# Patient Record
Sex: Male | Born: 1935 | Race: White | Hispanic: No | Marital: Married | Smoking: Former smoker
Health system: Southern US, Community
[De-identification: ages and names within clinical notes are randomized; demographics above are authoritative.]

## PROBLEM LIST (undated history)

## (undated) DIAGNOSIS — N200 Calculus of kidney: Secondary | ICD-10-CM

## (undated) DIAGNOSIS — I2510 Atherosclerotic heart disease of native coronary artery without angina pectoris: Secondary | ICD-10-CM

## (undated) DIAGNOSIS — I10 Essential (primary) hypertension: Secondary | ICD-10-CM

## (undated) DIAGNOSIS — G4730 Sleep apnea, unspecified: Secondary | ICD-10-CM

## (undated) DIAGNOSIS — M2060 Acquired deformities of toe(s), unspecified, unspecified foot: Secondary | ICD-10-CM

## (undated) DIAGNOSIS — N400 Enlarged prostate without lower urinary tract symptoms: Secondary | ICD-10-CM

## (undated) DIAGNOSIS — D509 Iron deficiency anemia, unspecified: Secondary | ICD-10-CM

## (undated) DIAGNOSIS — E785 Hyperlipidemia, unspecified: Secondary | ICD-10-CM

## (undated) DIAGNOSIS — I739 Peripheral vascular disease, unspecified: Secondary | ICD-10-CM

## (undated) DIAGNOSIS — Z87442 Personal history of urinary calculi: Secondary | ICD-10-CM

## (undated) DIAGNOSIS — H409 Unspecified glaucoma: Secondary | ICD-10-CM

## (undated) DIAGNOSIS — F349 Persistent mood [affective] disorder, unspecified: Secondary | ICD-10-CM

## (undated) DIAGNOSIS — S72001A Fracture of unspecified part of neck of right femur, initial encounter for closed fracture: Secondary | ICD-10-CM

## (undated) DIAGNOSIS — K648 Other hemorrhoids: Secondary | ICD-10-CM

## (undated) DIAGNOSIS — E538 Deficiency of other specified B group vitamins: Secondary | ICD-10-CM

## (undated) DIAGNOSIS — F0390 Unspecified dementia without behavioral disturbance: Secondary | ICD-10-CM

## (undated) DIAGNOSIS — Z961 Presence of intraocular lens: Secondary | ICD-10-CM

## (undated) DIAGNOSIS — L853 Xerosis cutis: Secondary | ICD-10-CM

## (undated) DIAGNOSIS — E559 Vitamin D deficiency, unspecified: Secondary | ICD-10-CM

## (undated) DIAGNOSIS — M2040 Other hammer toe(s) (acquired), unspecified foot: Secondary | ICD-10-CM

## (undated) DIAGNOSIS — F0280 Dementia in other diseases classified elsewhere without behavioral disturbance: Secondary | ICD-10-CM

## (undated) DIAGNOSIS — E119 Type 2 diabetes mellitus without complications: Secondary | ICD-10-CM

## (undated) DIAGNOSIS — N201 Calculus of ureter: Secondary | ICD-10-CM

## (undated) DIAGNOSIS — A045 Campylobacter enteritis: Secondary | ICD-10-CM

## (undated) DIAGNOSIS — K760 Fatty (change of) liver, not elsewhere classified: Secondary | ICD-10-CM

## (undated) DIAGNOSIS — N1330 Unspecified hydronephrosis: Secondary | ICD-10-CM

## (undated) DIAGNOSIS — I700 Atherosclerosis of aorta: Secondary | ICD-10-CM

## (undated) HISTORY — DX: Fracture of unspecified part of neck of right femur, initial encounter for closed fracture: S72.001A

## (undated) HISTORY — DX: Essential (primary) hypertension: I10

## (undated) HISTORY — DX: Peripheral vascular disease, unspecified: I73.9

## (undated) HISTORY — DX: Xerosis cutis: L85.3

## (undated) HISTORY — DX: Other hemorrhoids: K64.8

## (undated) HISTORY — DX: Campylobacter enteritis: A04.5

## (undated) HISTORY — DX: Calculus of kidney: N20.0

## (undated) HISTORY — DX: Acquired deformities of toe(s), unspecified, unspecified foot: M20.60

## (undated) HISTORY — DX: Fatty (change of) liver, not elsewhere classified: K76.0

## (undated) HISTORY — DX: Benign prostatic hyperplasia without lower urinary tract symptoms: N40.0

## (undated) HISTORY — DX: Sleep apnea, unspecified: G47.30

## (undated) HISTORY — DX: Atherosclerosis of aorta: I70.0

## (undated) HISTORY — DX: Vitamin D deficiency, unspecified: E55.9

## (undated) HISTORY — DX: Unspecified glaucoma: H40.9

## (undated) HISTORY — DX: Persistent mood (affective) disorder, unspecified: F34.9

## (undated) HISTORY — DX: Unspecified dementia, unspecified severity, without behavioral disturbance, psychotic disturbance, mood disturbance, and anxiety: F03.90

## (undated) HISTORY — DX: Iron deficiency anemia, unspecified: D50.9

## (undated) HISTORY — DX: Unspecified hydronephrosis: N13.30

## (undated) HISTORY — PX: TONSILLECTOMY: SUR1361

## (undated) HISTORY — DX: Atherosclerotic heart disease of native coronary artery without angina pectoris: I25.10

## (undated) HISTORY — PX: VASECTOMY: SHX75

## (undated) HISTORY — PX: KIDNEY SURGERY: SHX687

## (undated) HISTORY — DX: Deficiency of other specified B group vitamins: E53.8

## (undated) HISTORY — DX: Other hammer toe(s) (acquired), unspecified foot: M20.40

## (undated) HISTORY — DX: Calculus of ureter: N20.1

## (undated) HISTORY — DX: Hyperlipidemia, unspecified: E78.5

## (undated) HISTORY — DX: Presence of intraocular lens: Z96.1

## (undated) HISTORY — PX: HIP FRACTURE SURGERY: SHX118

## (undated) HISTORY — PX: TONSILLECTOMY: SHX5217

## (undated) HISTORY — DX: Type 2 diabetes mellitus without complications: E11.9

## (undated) HISTORY — DX: Personal history of urinary calculi: Z87.442

## (undated) HISTORY — DX: Dementia in other diseases classified elsewhere, unspecified severity, without behavioral disturbance, psychotic disturbance, mood disturbance, and anxiety: F02.80

---

## 1997-08-10 ENCOUNTER — Encounter: Admission: RE | Admit: 1997-08-10 | Discharge: 1997-11-08 | Payer: Self-pay | Admitting: Emergency Medicine

## 2000-02-14 ENCOUNTER — Ambulatory Visit (HOSPITAL_COMMUNITY): Admission: RE | Admit: 2000-02-14 | Discharge: 2000-02-15 | Payer: Self-pay | Admitting: Cardiovascular Disease

## 2000-02-14 HISTORY — PX: CORONARY ANGIOPLASTY WITH STENT PLACEMENT: SHX49

## 2004-02-05 ENCOUNTER — Ambulatory Visit: Payer: Self-pay | Admitting: Internal Medicine

## 2004-02-12 ENCOUNTER — Ambulatory Visit: Payer: Self-pay | Admitting: Internal Medicine

## 2004-05-13 ENCOUNTER — Ambulatory Visit: Payer: Self-pay | Admitting: Internal Medicine

## 2004-08-14 ENCOUNTER — Ambulatory Visit: Payer: Self-pay | Admitting: Internal Medicine

## 2004-11-13 ENCOUNTER — Ambulatory Visit: Payer: Self-pay | Admitting: Internal Medicine

## 2004-12-25 ENCOUNTER — Ambulatory Visit: Payer: Self-pay | Admitting: Internal Medicine

## 2005-02-13 ENCOUNTER — Ambulatory Visit: Payer: Self-pay | Admitting: Internal Medicine

## 2005-02-20 ENCOUNTER — Ambulatory Visit: Payer: Self-pay | Admitting: Internal Medicine

## 2005-05-22 ENCOUNTER — Ambulatory Visit: Payer: Self-pay | Admitting: Internal Medicine

## 2005-08-21 ENCOUNTER — Ambulatory Visit: Payer: Self-pay | Admitting: Internal Medicine

## 2005-11-27 ENCOUNTER — Ambulatory Visit: Payer: Self-pay | Admitting: Internal Medicine

## 2005-12-17 ENCOUNTER — Ambulatory Visit: Payer: Self-pay | Admitting: Internal Medicine

## 2005-12-25 DIAGNOSIS — I2510 Atherosclerotic heart disease of native coronary artery without angina pectoris: Secondary | ICD-10-CM

## 2005-12-25 HISTORY — DX: Atherosclerotic heart disease of native coronary artery without angina pectoris: I25.10

## 2006-01-09 ENCOUNTER — Inpatient Hospital Stay (HOSPITAL_BASED_OUTPATIENT_CLINIC_OR_DEPARTMENT_OTHER): Admission: RE | Admit: 2006-01-09 | Discharge: 2006-01-09 | Payer: Self-pay | Admitting: Cardiovascular Disease

## 2006-01-09 HISTORY — PX: CORONARY ANGIOPLASTY WITH STENT PLACEMENT: SHX49

## 2006-01-14 ENCOUNTER — Inpatient Hospital Stay (HOSPITAL_COMMUNITY): Admission: RE | Admit: 2006-01-14 | Discharge: 2006-01-18 | Payer: Self-pay | Admitting: Surgery

## 2006-01-17 HISTORY — PX: CORONARY ARTERY BYPASS GRAFT: SHX141

## 2006-02-26 ENCOUNTER — Encounter (HOSPITAL_COMMUNITY): Admission: RE | Admit: 2006-02-26 | Discharge: 2006-05-27 | Payer: Self-pay | Admitting: Cardiovascular Disease

## 2006-04-03 ENCOUNTER — Ambulatory Visit: Payer: Self-pay | Admitting: Internal Medicine

## 2006-04-03 LAB — CONVERTED CEMR LAB
ALT: 11 units/L (ref 0–40)
AST: 16 units/L (ref 0–37)
Albumin: 4.1 g/dL (ref 3.5–5.2)
Alkaline Phosphatase: 53 units/L (ref 39–117)
BUN: 12 mg/dL (ref 6–23)
Basophils Absolute: 0 10*3/uL (ref 0.0–0.1)
Basophils Relative: 0.5 % (ref 0.0–1.0)
Bilirubin, Direct: 0.1 mg/dL (ref 0.0–0.3)
CO2: 27 meq/L (ref 19–32)
Calcium: 10.1 mg/dL (ref 8.4–10.5)
Chloride: 106 meq/L (ref 96–112)
Cholesterol: 167 mg/dL (ref 0–200)
Creatinine, Ser: 0.9 mg/dL (ref 0.4–1.5)
Eosinophils Absolute: 0.5 10*3/uL (ref 0.0–0.6)
Eosinophils Relative: 6.4 % — ABNORMAL HIGH (ref 0.0–5.0)
GFR calc Af Amer: 107 mL/min
GFR calc non Af Amer: 88 mL/min
Glucose, Bld: 113 mg/dL — ABNORMAL HIGH (ref 70–99)
HCT: 38.3 % — ABNORMAL LOW (ref 39.0–52.0)
HDL: 37.8 mg/dL — ABNORMAL LOW (ref >39.0)
Hemoglobin: 13.5 g/dL (ref 13.0–17.0)
Hgb A1c MFr Bld: 7.2 % — ABNORMAL HIGH (ref 4.6–6.0)
LDL Cholesterol: 98 mg/dL (ref 0–99)
Lymphocytes Relative: 27.1 % (ref 12.0–46.0)
MCHC: 35.2 g/dL (ref 30.0–36.0)
MCV: 80.2 fL (ref 78.0–100.0)
Monocytes Absolute: 0.8 10*3/uL — ABNORMAL HIGH (ref 0.2–0.7)
Monocytes Relative: 10.5 % (ref 3.0–11.0)
Neutro Abs: 4.3 10*3/uL (ref 1.4–7.7)
Neutrophils Relative %: 55.5 % (ref 43.0–77.0)
PSA: 1.27 ng/mL (ref 0.10–4.00)
Platelets: 430 10*3/uL — ABNORMAL HIGH (ref 150–400)
Potassium: 5.2 meq/L — ABNORMAL HIGH (ref 3.5–5.1)
RBC: 4.77 M/uL (ref 4.22–5.81)
RDW: 12.7 % (ref 11.5–14.6)
Sodium: 144 meq/L (ref 135–145)
TSH: 3.72 microintl units/mL (ref 0.35–5.50)
Total Bilirubin: 1.1 mg/dL (ref 0.3–1.2)
Total CHOL/HDL Ratio: 4.4
Total Protein: 6.6 g/dL (ref 6.0–8.3)
Triglycerides: 154 mg/dL — ABNORMAL HIGH (ref 0–149)
VLDL: 31 mg/dL (ref 0–40)
WBC: 7.7 10*3/uL (ref 4.5–10.5)

## 2006-04-09 ENCOUNTER — Ambulatory Visit: Payer: Self-pay | Admitting: Internal Medicine

## 2006-07-15 ENCOUNTER — Ambulatory Visit: Payer: Self-pay | Admitting: Internal Medicine

## 2006-07-15 LAB — CONVERTED CEMR LAB: Hgb A1c MFr Bld: 8 % — ABNORMAL HIGH (ref 4.6–6.0)

## 2006-07-17 ENCOUNTER — Encounter: Payer: Self-pay | Admitting: Internal Medicine

## 2006-07-17 DIAGNOSIS — I2510 Atherosclerotic heart disease of native coronary artery without angina pectoris: Secondary | ICD-10-CM | POA: Insufficient documentation

## 2006-07-17 DIAGNOSIS — E785 Hyperlipidemia, unspecified: Secondary | ICD-10-CM | POA: Insufficient documentation

## 2006-07-17 DIAGNOSIS — E119 Type 2 diabetes mellitus without complications: Secondary | ICD-10-CM | POA: Insufficient documentation

## 2006-10-15 ENCOUNTER — Ambulatory Visit: Payer: Self-pay | Admitting: Internal Medicine

## 2006-10-15 LAB — CONVERTED CEMR LAB: Hgb A1c MFr Bld: 7.6 % — ABNORMAL HIGH

## 2006-12-08 ENCOUNTER — Ambulatory Visit: Payer: Self-pay | Admitting: Internal Medicine

## 2007-01-12 ENCOUNTER — Ambulatory Visit: Payer: Self-pay | Admitting: Internal Medicine

## 2007-01-22 ENCOUNTER — Ambulatory Visit: Payer: Self-pay | Admitting: Gastroenterology

## 2007-02-08 ENCOUNTER — Ambulatory Visit: Payer: Self-pay | Admitting: Gastroenterology

## 2007-02-25 LAB — HM COLONOSCOPY

## 2007-04-14 ENCOUNTER — Ambulatory Visit: Payer: Self-pay | Admitting: Internal Medicine

## 2007-04-20 ENCOUNTER — Encounter: Payer: Self-pay | Admitting: Internal Medicine

## 2007-07-14 ENCOUNTER — Ambulatory Visit: Payer: Self-pay | Admitting: Internal Medicine

## 2007-07-14 LAB — CONVERTED CEMR LAB
ALT: 14 units/L (ref 0–53)
Basophils Absolute: 0.1 10*3/uL (ref 0.0–0.1)
Bilirubin, Direct: 0.1 mg/dL (ref 0.0–0.3)
CO2: 29 meq/L (ref 19–32)
Calcium: 9.2 mg/dL (ref 8.4–10.5)
Cholesterol: 152 mg/dL (ref 0–200)
Eosinophils Absolute: 0.7 10*3/uL (ref 0.0–0.7)
GFR calc Af Amer: 122 mL/min
GFR calc non Af Amer: 101 mL/min
Hemoglobin: 14.4 g/dL (ref 13.0–17.0)
LDL Cholesterol: 94 mg/dL (ref 0–99)
Lymphocytes Relative: 27.1 % (ref 12.0–46.0)
MCHC: 34.4 g/dL (ref 30.0–36.0)
Neutro Abs: 4.1 10*3/uL (ref 1.4–7.7)
PSA: 1.43 ng/mL (ref 0.10–4.00)
RDW: 12.4 % (ref 11.5–14.6)
Sodium: 139 meq/L (ref 135–145)
TSH: 2.8 microintl units/mL (ref 0.35–5.50)
Total Bilirubin: 0.7 mg/dL (ref 0.3–1.2)
Triglycerides: 127 mg/dL (ref 0–149)
VLDL: 25 mg/dL (ref 0–40)

## 2007-09-15 ENCOUNTER — Telehealth: Payer: Self-pay | Admitting: Internal Medicine

## 2007-11-15 ENCOUNTER — Ambulatory Visit: Payer: Self-pay | Admitting: Internal Medicine

## 2007-11-15 LAB — CONVERTED CEMR LAB: Hgb A1c MFr Bld: 6.7 % — ABNORMAL HIGH (ref 4.6–6.0)

## 2007-11-19 ENCOUNTER — Ambulatory Visit: Payer: Self-pay | Admitting: Internal Medicine

## 2008-01-10 ENCOUNTER — Encounter: Payer: Self-pay | Admitting: Internal Medicine

## 2008-02-02 ENCOUNTER — Telehealth: Payer: Self-pay | Admitting: Internal Medicine

## 2008-03-13 ENCOUNTER — Ambulatory Visit: Payer: Self-pay | Admitting: Internal Medicine

## 2008-06-26 ENCOUNTER — Encounter: Payer: Self-pay | Admitting: Internal Medicine

## 2008-07-10 ENCOUNTER — Ambulatory Visit: Payer: Self-pay | Admitting: Internal Medicine

## 2008-07-10 LAB — CONVERTED CEMR LAB
Microalb, Ur: 7.5 mg/dL — ABNORMAL HIGH (ref 0.0–1.9)
PSA: 1.66 ng/mL (ref 0.10–4.00)

## 2008-10-31 ENCOUNTER — Ambulatory Visit: Payer: Self-pay | Admitting: Internal Medicine

## 2008-11-02 LAB — CONVERTED CEMR LAB
ALT: 14 units/L (ref 0–53)
AST: 20 units/L (ref 0–37)
Basophils Relative: 0.7 % (ref 0.0–3.0)
Bilirubin, Direct: 0.1 mg/dL (ref 0.0–0.3)
Chloride: 103 meq/L (ref 96–112)
Eosinophils Relative: 9.2 % — ABNORMAL HIGH (ref 0.0–5.0)
GFR calc non Af Amer: 87.59 mL/min (ref >60)
HCT: 44.1 % (ref 39.0–52.0)
Hgb A1c MFr Bld: 7 % — ABNORMAL HIGH (ref 4.6–6.5)
LDL Cholesterol: 93 mg/dL (ref 0–99)
Lymphs Abs: 2.2 10*3/uL (ref 0.7–4.0)
MCV: 94 fL (ref 78.0–100.0)
Monocytes Absolute: 0.9 10*3/uL (ref 0.1–1.0)
Monocytes Relative: 9.8 % (ref 3.0–12.0)
Neutrophils Relative %: 56.6 % (ref 43.0–77.0)
Potassium: 4.8 meq/L (ref 3.5–5.1)
RBC: 4.69 M/uL (ref 4.22–5.81)
Total CHOL/HDL Ratio: 5
Total Protein: 6.6 g/dL (ref 6.0–8.3)
VLDL: 29.6 mg/dL (ref 0.0–40.0)
WBC: 9.1 10*3/uL (ref 4.5–10.5)

## 2008-11-29 ENCOUNTER — Ambulatory Visit: Payer: Self-pay | Admitting: Internal Medicine

## 2009-01-02 ENCOUNTER — Encounter (INDEPENDENT_AMBULATORY_CARE_PROVIDER_SITE_OTHER): Payer: Self-pay | Admitting: *Deleted

## 2009-02-13 ENCOUNTER — Ambulatory Visit: Payer: Self-pay | Admitting: Internal Medicine

## 2009-02-13 DIAGNOSIS — L259 Unspecified contact dermatitis, unspecified cause: Secondary | ICD-10-CM | POA: Insufficient documentation

## 2009-02-20 ENCOUNTER — Telehealth: Payer: Self-pay | Admitting: Internal Medicine

## 2009-02-27 ENCOUNTER — Ambulatory Visit: Payer: Self-pay | Admitting: Internal Medicine

## 2009-03-13 ENCOUNTER — Ambulatory Visit: Payer: Self-pay | Admitting: Internal Medicine

## 2009-06-12 ENCOUNTER — Ambulatory Visit: Payer: Self-pay | Admitting: Internal Medicine

## 2009-06-12 LAB — CONVERTED CEMR LAB: Hgb A1c MFr Bld: 6.6 % — ABNORMAL HIGH (ref 4.6–6.5)

## 2009-07-10 ENCOUNTER — Encounter: Payer: Self-pay | Admitting: Internal Medicine

## 2009-09-13 ENCOUNTER — Ambulatory Visit: Payer: Self-pay | Admitting: Internal Medicine

## 2009-09-13 LAB — CONVERTED CEMR LAB: Hgb A1c MFr Bld: 7.6 % — ABNORMAL HIGH (ref 4.6–6.5)

## 2009-12-10 ENCOUNTER — Ambulatory Visit: Payer: Self-pay | Admitting: Internal Medicine

## 2009-12-11 ENCOUNTER — Telehealth: Payer: Self-pay | Admitting: Internal Medicine

## 2010-01-15 ENCOUNTER — Encounter: Payer: Self-pay | Admitting: Internal Medicine

## 2010-01-15 ENCOUNTER — Ambulatory Visit: Payer: Self-pay | Admitting: Cardiovascular Disease

## 2010-01-16 ENCOUNTER — Encounter: Payer: Self-pay | Admitting: Internal Medicine

## 2010-01-16 ENCOUNTER — Ambulatory Visit: Payer: Self-pay | Admitting: Cardiovascular Disease

## 2010-03-12 ENCOUNTER — Other Ambulatory Visit: Payer: Self-pay | Admitting: Internal Medicine

## 2010-03-12 ENCOUNTER — Ambulatory Visit
Admission: RE | Admit: 2010-03-12 | Discharge: 2010-03-12 | Payer: Self-pay | Source: Home / Self Care | Attending: Internal Medicine | Admitting: Internal Medicine

## 2010-03-12 LAB — HEMOGLOBIN A1C: Hgb A1c MFr Bld: 7.4 % — ABNORMAL HIGH (ref 4.6–6.5)

## 2010-03-28 NOTE — Assessment & Plan Note (Signed)
Summary: 3 month rov/njr   Vital Signs:  Patient profile:   78 year old male Weight:      170 pounds Temp:     98.4 degrees F oral BP sitting:   190 / 90  (left arm) Cuff size:   regular  Vitals Entered By: Duard Brady LPN (June 12, 2009 10:49 AM) CC: 3 mon rov-doing okay Is Patient Diabetic? Yes   CC:  3 mon rov-doing okay.  History of Present Illness: 77 year old patient seen today for follow-up of his hypertension, type 2 diabetes, coronary artery disease.  Multiple medicines have been held due to a suspected  drug allergic allergy with a dermatitis.  This has resolved, and it was felt that lisinopril, probably was the culprit.  He does try blood pressures at home with fairly decent readings, but was quite high on arrival here.  His last hemoglobin A1c7.2.  He is on low-dose metformin, as well as glyburide.  His lovastatin niacin, have been held, nifedipine.  Also has been held  Preventive Screening-Counseling & Management  Alcohol-Tobacco     Smoking Status: quit  Allergies (verified): 1)  ! Lisinopril (Lisinopril)  Past History:  Past Medical History: Reviewed history from 07/14/2007 and no changes required. Coronary artery disease; status post PTCA in 2001; status post CABG 2007 Diabetes mellitus, type II; 1998 Hyperlipidemia Hypertension; 1998  Review of Systems  The patient denies anorexia, fever, weight loss, weight gain, vision loss, decreased hearing, hoarseness, chest pain, syncope, dyspnea on exertion, peripheral edema, prolonged cough, headaches, hemoptysis, abdominal pain, melena, hematochezia, severe indigestion/heartburn, hematuria, incontinence, genital sores, muscle weakness, suspicious skin lesions, transient blindness, difficulty walking, depression, unusual weight change, abnormal bleeding, enlarged lymph nodes, angioedema, breast masses, and testicular masses.    Physical Exam  General:  overweight-appearing.  160/80overweight-appearing.     Head:  Normocephalic and atraumatic without obvious abnormalities. No apparent alopecia or balding. Eyes:  No corneal or conjunctival inflammation noted. EOMI. Perrla. Funduscopic exam benign, without hemorrhages, exudates or papilledema. Vision grossly normal. Ears:  External ear exam shows no significant lesions or deformities.  Otoscopic examination reveals clear canals, tympanic membranes are intact bilaterally without bulging, retraction, inflammation or discharge. Hearing is grossly normal bilaterally. Mouth:  Oral mucosa and oropharynx without lesions or exudates.  Teeth in good repair. Neck:  No deformities, masses, or tenderness noted. Lungs:  Normal respiratory effort, chest expands symmetrically. Lungs are clear to auscultation, no crackles or wheezes. Heart:  Normal rate and regular rhythm. S1 and S2 normal without gallop, murmur, click, rub or other extra sounds. Abdomen:  Bowel sounds positive,abdomen soft and non-tender without masses, organomegaly or hernias noted. Msk:  No deformity or scoliosis noted of thoracic or lumbar spine.   Pulses:  the left dorsalis pedis pulse full ;other pedal pulses not easily palpable Extremities:  No clubbing, cyanosis, edema, or deformity noted with normal full range of motion of all joints.     Impression & Recommendations:  Problem # 1:  DERMATITIS (ICD-692.9) resolved  Problem # 2:  HYPERTENSION (ICD-401.9)  The following medications were removed from the medication list:    Lisinopril 20 Mg Tabs (Lisinopril) .[name]... Take 1 tablet by mouth daily His updated medication list for this problem includes:    Nifedipine 30 Mg Tb24 (Nifedipine) .[name]... 1 once daily    Carvedilol 12.5 Mg Tabs (Carvedilol) .[name]... 1 two times a day  The following medications were removed from the medication list:    Lisinopril 20 Mg Tabs (  Lisinopril) .[name]... Take 1 tablet by mouth daily His updated medication list for this problem includes:    Nifedipine 30 Mg Tb24  (Nifedipine) .[name]... 1 once daily    Carvedilol 12.5 Mg Tabs (Carvedilol) .[name]... 1 two times a day  Problem # 3:  HYPERLIPIDEMIA (ICD-272.4)  The following medications were removed from the medication list:    Lovastatin 20 Mg Tabs (Lovastatin) .[name]... 2 once daily His updated medication list for this problem includes:    Niacin Cr 1000 Mg Cr-tabs (Niacin) .[name]... 1 once daily    Lovastatin 20 Mg Tabs (Lovastatin) ..... One daily  The following medications were removed from the medication list:    Lovastatin 20 Mg Tabs (Lovastatin) .[name]... 2 once daily His updated medication list for this problem includes:    Niacin Cr 1000 Mg Cr-tabs (Niacin) .[name]... 1 once daily    Lovastatin 20 Mg Tabs (Lovastatin) ..... One daily  Problem # 4:  DIABETES MELLITUS, TYPE II (ICD-250.00)  The following medications were removed from the medication list:    Lisinopril 20 Mg Tabs (Lisinopril) .[name]... Take 1 tablet by mouth daily    Buffered Aspirin 325 Mg Tabs (Aspirin buf(cacarb-mgcarb-mgo)) .[name]... 1 once daily His updated medication list for this problem includes:    Glyburide 5 Mg Tabs (Glyburide) .[name]... 1/2 bid    Metformin Hcl 1000 Mg Tabs (Metformin hcl) .[name]... 1 once daily    Aspir-low 81 Mg Tbec (Aspirin) ..... Once daily    The following medications were removed from the medication list:    Lisinopril 20 Mg Tabs (Lisinopril) .[name]... Take 1 tablet by mouth daily    Buffered Aspirin 325 Mg Tabs (Aspirin buf(cacarb-mgcarb-mgo)) .[name]... 1 once daily His updated medication list for this problem includes:    Glyburide 5 Mg Tabs (Glyburide) .[name]... 1/2 bid    Metformin Hcl 1000 Mg Tabs (Metformin hcl) .[name]... 1 once daily    Aspir-low 81 Mg Tbec (Aspirin) ..... Once daily  Orders: Venipuncture (96045) TLB-A1C / Hgb A1C (Glycohemoglobin) (83036-A1C)  Complete Medication List: 1)  Glyburide 5 Mg Tabs (Glyburide) .... 1/2 bid 2)  Metformin Hcl 1000 Mg Tabs (Metformin hcl) .[name].. 1 once daily 3)  Nifedipine 30 Mg Tb24  (Nifedipine) .[name].. 1 once daily 4)  Carvedilol 12.5 Mg Tabs (Carvedilol) .[name].. 1 two times a day 5)  Travatan 0.004 % Soln (Travoprost) .... Uad 6)  Niacin Cr 1000 Mg Cr-tabs (Niacin) .[name].. 1 once daily 7)  Accu-chek Softclix Lancets Misc (Lancets) .... Use daily 8)  Accu-chek Aviva Strp (Glucose blood) .... Once daily 9)  Aspir-low 81 Mg Tbec (Aspirin) .... Once daily 10)  Flax Seeds Powd (Flaxseed (linseed)) .... Once daily 11)  Multivitamins Caps (Multiple vitamin) .... Once daily 12)  Vision Formula Tabs (Multiple vitamins-minerals) .... Once daily 13)  Lovastatin 20 Mg Tabs (Lovastatin) .... One daily 14)  Accu-chek Aviva Strp (Glucose blood) .... Use daily  Patient Instructions: 1)  Please schedule a follow-up appointment in 3 months. 2)  Limit your Sodium (Salt). 3)  It is important that you exercise regularly at least 20 minutes 5 times a week. If you develop chest pain, have severe difficulty breathing, or feel very tired , stop exercising immediately and seek medical attention. 4)  You need to lose weight. Consider a lower calorie diet and regular exercise.  5)  Check your blood sugars regularly. If your readings are usually above : or below 70 you should contact our office. 6)  It is important that your  Diabetic A1c level is checked every 3 months. 7)  See your eye doctor yearly to check for diabetic eye damage. Prescriptions: ACCU-CHEK AVIVA  STRP (GLUCOSE BLOOD) use daily  #90 x 6   Entered and Authorized by:   Gordy Savers  MD   Signed by:   Gordy Savers  MD on 06/12/2009   Method used:   Print then Give to Patient   RxID:   [id number] LOVASTATIN 20 MG TABS (LOVASTATIN) one daily  #90 x 6   Entered and Authorized by:   Gordy Savers  MD   Signed by:   Gordy Savers  MD on 06/12/2009   Method used:   Print then Give to Patient   RxID:   [id number] ACCU-CHEK SOFTCLIX LANCETS  MISC (LANCETS) use daily  #90 x 6   Entered and Authorized by:    Gordy Savers  MD   Signed by:   Gordy Savers  MD on 06/12/2009   Method used:   Print then Give to Patient   RxID:   [id number] NIACIN CR 1000 MG CR-TABS (NIACIN) 1 once daily  #90 x 6   Entered and Authorized by:   Gordy Savers  MD   Signed by:   Gordy Savers  MD on 06/12/2009   Method used:   Print then Give to Patient   RxID:   [id number] CARVEDILOL 12.5 MG  TABS (CARVEDILOL) 1 two times a day  #180 x 6   Entered and Authorized by:   Gordy Savers  MD   Signed by:   Gordy Savers  MD on 06/12/2009   Method used:   Print then Give to Patient   RxID:   [id number] NIFEDIPINE 30 MG  TB24 (NIFEDIPINE) 1 once daily  #90 Each x 4   Entered and Authorized by:   Gordy Savers  MD   Signed by:   Gordy Savers  MD on 06/12/2009   Method used:   Print then Give to Patient   RxID:   [id number] METFORMIN HCL 1000 MG TABS (METFORMIN HCL) 1 once daily  #90 x 6   Entered and Authorized by:   Gordy Savers  MD   Signed by:   Gordy Savers  MD on 06/12/2009   Method used:   Print then Give to Patient   RxID:   [id number] GLYBURIDE 5 MG  TABS (GLYBURIDE) 1/2 bid  #90 x 6   Entered and Authorized by:   Gordy Savers  MD   Signed by:   Gordy Savers  MD on 06/12/2009   Method used:   Print then Give to Patient   RxID:   [id number] ACCU-CHEK AVIVA  STRP (GLUCOSE BLOOD) once daily  #100 x 0   Entered by:   Duard Brady LPN   Authorized by:   Gordy Savers  MD   Signed by:   Gordy Savers  MD on 06/12/2009   Method used:   Print then Give to Patient   RxID:   [id number]

## 2010-03-28 NOTE — Assessment & Plan Note (Signed)
Summary: Follow up/ns policy/cb  a  Vital Signs:  Patient profile:   77 year old male Weight:      172 pounds Temp:     98.2 degrees F oral BP sitting:   146 / 70  (right arm) Cuff size:   regular  Vitals Entered By: Duard Brady LPN (September 13, 2009 9:55 AM) CC: rov- doing well     fbs 89 Is Patient Diabetic? Yes Did you bring your meter with you today? No   CC:  rov- doing well     fbs 89.  History of Present Illness: 77 year old patient who has a history of coronary artery disease, type 2 diabetes, and dyslipidemia.  He has had a dermatitis related to ACE inhibition, and this has been discontinued.  He has  also discontinued amlodipine, but has maintained nice, blood pressure control.  he has altered his  oral diabetic regimen.  Has seen cardiology approximately 2 months ago.  No cardiac symptoms.  Preventive Screening-Counseling & Management  Alcohol-Tobacco     Smoking Status: quit  Allergies: 1)  ! Lisinopril (Lisinopril)  Past History:  Past Medical History: Reviewed history from 07/14/2007 and no changes required. Coronary artery disease; status post PTCA in 2001; status post CABG 2007 Diabetes mellitus, type II; 1998 Hyperlipidemia Hypertension; 1998  Past Surgical History: Reviewed history from 04/14/2007 and no changes required. Coronary artery bypass graft PTCA/stent Vasectomy Tonsillectomy Cardiolite  stress test in November 2007 colonoscopy January 2009  Review of Systems  The patient denies anorexia, fever, weight loss, weight gain, vision loss, decreased hearing, hoarseness, chest pain, syncope, dyspnea on exertion, peripheral edema, prolonged cough, headaches, hemoptysis, abdominal pain, melena, hematochezia, severe indigestion/heartburn, hematuria, incontinence, genital sores, muscle weakness, suspicious skin lesions, transient blindness, difficulty walking, depression, unusual weight change, abnormal bleeding, enlarged lymph nodes, angioedema,  breast masses, and testicular masses.    Physical Exam  General:  overweight-appearing.  130/70overweight-appearing.   Head:  Normocephalic and atraumatic without obvious abnormalities. No apparent alopecia or balding. Eyes:  No corneal or conjunctival inflammation noted. EOMI. Perrla. Funduscopic exam benign, without hemorrhages, exudates or papilledema. Vision grossly normal. Mouth:  Oral mucosa and oropharynx without lesions or exudates.  Teeth in good repair. Neck:  No deformities, masses, or tenderness noted. faint right supra-clavicular ruing Chest Wall:  No deformities, masses, tenderness or gynecomastia noted. Lungs:  few crackles, right base Heart:  Normal rate and regular rhythm. S1 and S2 normal without gallop, murmur, click, rub or other extra sounds. Abdomen:  Bowel sounds positive,abdomen soft and non-tender without masses, organomegaly or hernias noted. Msk:  No deformity or scoliosis noted of thoracic or lumbar spine.   Extremities:  No clubbing, cyanosis, edema, or deformity noted with normal full range of motion of all joints.    Diabetes Management Exam:    Eye Exam:       Eye Exam done here today          Results: normal   Impression & Recommendations:  Problem # 1:  HYPERTENSION (ICD-401.9)  The following medications were removed from the medication list:    Nifedipine 30 Mg Tb24 (Nifedipine) .[name]... 1 once daily His updated medication list for this problem includes:    Carvedilol 12.5 Mg Tabs (Carvedilol) .[name]... 1 two times a day  The following medications were removed from the medication list:    Nifedipine 30 Mg Tb24 (Nifedipine) .[name]... 1 once daily His updated medication list for this problem includes:    Carvedilol 12.5 Mg  Tabs (Carvedilol) .[name]... 1 two times a day  Problem # 2:  DIABETES MELLITUS, TYPE II (ICD-250.00)  The following medications were removed from the medication list:    Aspir-low 81 Mg Tbec (Aspirin) ..... Once daily His updated  medication list for this problem includes:    Glyburide 5 Mg Tabs (Glyburide) .[name]... 1/2 bid    Metformin Hcl 1000 Mg Tabs (Metformin hcl) .[name]... 1 twice daily    Aspir-low 81 Mg Tbec (Aspirin) ..... One daily  Orders: Venipuncture (88416) TLB-A1C / Hgb A1C (Glycohemoglobin) (83036-A1C) Prescription Created Electronically [phone])  The following medications were removed from the medication list:    Aspir-low 81 Mg Tbec (Aspirin) ..... Once daily His updated medication list for this problem includes:    Glyburide 5 Mg Tabs (Glyburide) .[name]... 1/2 bid    Metformin Hcl 1000 Mg Tabs (Metformin hcl) .[name]... 1 once daily  Problem # 3:  HYPERLIPIDEMIA (ICD-272.4)  The following medications were removed from the medication list:    Niacin Cr 1000 Mg Cr-tabs (Niacin) .[name]... 1 once daily His updated medication list for this problem includes:    Lovastatin 20 Mg Tabs (Lovastatin) ..... One daily  The following medications were removed from the medication list:    Niacin Cr 1000 Mg Cr-tabs (Niacin) .[name]... 1 once daily His updated medication list for this problem includes:    Lovastatin 20 Mg Tabs (Lovastatin) ..... One daily  Complete Medication List: 1)  Glyburide 5 Mg Tabs (Glyburide) .... 1/2 bid 2)  Metformin Hcl 1000 Mg Tabs (Metformin hcl) .[name].. 1 twice daily 3)  Carvedilol 12.5 Mg Tabs (Carvedilol) .[name].. 1 two times a day 4)  Travatan 0.004 % Soln (Travoprost) .... Uad 5)  Accu-chek Softclix Lancets Misc (Lancets) .... Use daily 6)  Accu-chek Aviva Strp (Glucose blood) .... Once daily 7)  Flax Seeds Powd (Flaxseed (linseed)) .... Once daily 8)  Multivitamins Caps (Multiple vitamin) .... Once daily 9)  Vision Formula Tabs (Multiple vitamins-minerals) .... Once daily 10)  Lovastatin 20 Mg Tabs (Lovastatin) .... One daily 11)  Aspir-low 81 Mg Tbec (Aspirin) .... One daily  Patient Instructions: 1)  Please schedule a follow-up appointment in 3 months. 2)  Limit your Sodium (Salt) to less than  2 grams a day(slightly less than 1/2 a teaspoon) to prevent fluid retention, swelling, or worsening of symptoms. 3)  It is important that you exercise regularly at least 20 minutes 5 times a week. If you develop chest pain, have severe difficulty breathing, or feel very tired , stop exercising immediately and seek medical attention. 4)  You need to lose weight. Consider a lower calorie diet and regular exercise.  5)  Check your blood sugars regularly. If your readings are usually above : or below 70 you should contact our office. 6)  It is important that your Diabetic A1c level is checked every 3 months. 7)  See your eye doctor yearly to check for diabetic eye damage. Prescriptions: ACCU-CHEK AVIVA  STRP (GLUCOSE BLOOD) use daily  #90 x 6   Entered and Authorized by:   Gordy Savers  MD   Signed by:   Gordy Savers  MD on 09/13/2009   Method used:   Electronically to        [facility]  [phone]* (retail)       [address]       [place], [place]  [address]  Ph: [phone] or [phone]       Fax: [phone]   RxID:   [id number] LOVASTATIN 20 MG TABS (LOVASTATIN) one daily  #90 x 6   Entered and Authorized by:   Gordy Savers  MD   Signed by:   Gordy Savers  MD on 09/13/2009   Method used:   Electronically to        [facility]  [phone]* (retail)       [address]       [place], [place]  [address]       Ph: [phone] or [phone]       Fax: [phone]   RxID:   [id number] ACCU-CHEK SOFTCLIX LANCETS  MISC (LANCETS) use daily  #90 x 6   Entered and Authorized by:   Gordy Savers  MD   Signed by:   Gordy Savers  MD on 09/13/2009   Method used:   Electronically to        [facility]  [phone]* (retail)       [address]       [place], [place]  [address]       Ph: [phone] or [phone]       Fax:  [phone]   RxID:   [id number] CARVEDILOL 12.5 MG  TABS (CARVEDILOL) 1 two times a day  #180 x 6   Entered and Authorized by:   Gordy Savers  MD   Signed by:   Gordy Savers  MD on 09/13/2009   Method used:   Electronically to        [facility]  [phone]* (retail)       [address]       [place], [place]  [address]       Ph: [phone] or [phone]       Fax: [phone]   RxID:   [id number] METFORMIN HCL 1000 MG TABS (METFORMIN HCL) 1 once daily  #90 x 6   Entered and Authorized by:   Gordy Savers  MD   Signed by:   Gordy Savers  MD on 09/13/2009   Method used:   Electronically to        [facility]  [phone]* (retail)       [address]       [place], [place]  [address]       Ph: [phone] or [phone]       Fax: [phone]   RxID:   [id number] GLYBURIDE 5 MG  TABS (GLYBURIDE) 1/2 bid  #90 x 6   Entered and Authorized by:   Gordy Savers  MD   Signed by:   Gordy Savers  MD on 09/13/2009   Method used:   Electronically to        [facility]  [phone]* (retail)       [address]       [place], [place]  [address]       Ph: [phone] or [phone]       Fax: [phone]   RxID:   [id number]

## 2010-03-28 NOTE — Progress Notes (Signed)
Summary: REQUEST FOR RESULTS  Phone Note Call from Patient   Caller: Patient Summary of Call: Pt adv that he didn't receive results from his hgba1c.... Request a return call to [phone] to advise same once available.  ADDENDUM-----PT REQ TO BE TRANSFERRED TO SPEAK WITH A NURSE - DIDN'T WANT TO WAIT FOR CALL ADVISING RESULTS.   Initial call taken by: Debbra Riding,  December 11, 2009 10:14 AM  Follow-up for Phone Call        callee pt - nitro sent , do not have labs results yet - will call once avilb. KIK Follow-up by: Duard Brady LPN,  December 11, 2009 10:24 AM  Additional Follow-up for Phone Call Additional follow up Details #1::        hemoglobin A1c was elevated at 7.5.  Notified patient that this is elevated.  Stressed diet, weight loss and exercise.  Notified patient that may require additional medication in 3 months if not improved Additional Follow-up by: Gordy Savers  MD,  December 12, 2009 8:47 AM    Additional Follow-up for Phone Call Additional follow up Details #2::    called pt - informed of lab - discussed diet ,exercise and wt loss. possible more medication in 3 mos if not improved.   mailed copy of labs per request to pt home address   KIK Follow-up by: Duard Brady LPN,  December 12, 2009 9:59 AM

## 2010-03-28 NOTE — Assessment & Plan Note (Signed)
Summary: 3 MONTH ROV/NJR   Vital Signs:  Patient profile:   77 year old male Weight:      174 pounds Temp:     98.4 degrees F oral BP sitting:   122 / 80  (right arm) Cuff size:   regular  Vitals Entered By: Duard Brady LPN (March 12, 2010 10:28 AM) CC: 3 mos rov - doing well    fbs 127 Is Patient Diabetic? Yes Did you bring your meter with you today? No   CC:  3 mos rov - doing well    fbs 127.  History of Present Illness: 47 -year-old patient who is seen today for follow-up of his type 2 diabetes.  He is single and A1c's have attended since the spring of last year.  His last hemoglobin A1c7.5.  He is on submaximal dose of both glyburide and metformin. He has coronary artery disease and is followed closely by cardiology.  He remains on Pravachol for dyslipidemia.  His cardiac status has been stable.  He has hypertension, treated with carvedilol.  Allergies: 1)  ! Lisinopril (Lisinopril)  Past History:  Past Medical History: Reviewed history from 07/14/2007 and no changes required. Coronary artery disease; status post PTCA in 2001; status post CABG 2007 Diabetes mellitus, type II; 1998 Hyperlipidemia Hypertension; 1998  Past Surgical History: Coronary artery bypass graft PTCA/stent Vasectomy Tonsillectomy Cardiolite  stress test in November 2007, November 2009 colonoscopy January 2009  Family History: Reviewed history from 07/14/2007 and no changes required. father died of an MI 22 mother died of a stroke 10 no siblings  paternal grandmother with history of  diabetes answerable vascular disease  Social History: Reviewed history from 10/31/2008 and no changes required. Married Regular exercise-yes  Review of Systems  The patient denies anorexia, fever, weight loss, weight gain, vision loss, decreased hearing, hoarseness, chest pain, syncope, dyspnea on exertion, peripheral edema, prolonged cough, headaches, hemoptysis, abdominal pain, melena,  hematochezia, severe indigestion/heartburn, hematuria, incontinence, genital sores, muscle weakness, suspicious skin lesions, transient blindness, difficulty walking, depression, unusual weight change, abnormal bleeding, enlarged lymph nodes, angioedema, breast masses, and testicular masses.    Physical Exam  General:  Well-developed,well-nourished,in no acute distress; alert,appropriate and cooperative throughout examination; overweight.  Blood pressure 122/80 Head:  Normocephalic and atraumatic without obvious abnormalities. No apparent alopecia or balding. Eyes:  No corneal or conjunctival inflammation noted. EOMI. Perrla. Funduscopic exam benign, without hemorrhages, exudates or papilledema. Vision grossly normal. Mouth:  Oral mucosa and oropharynx without lesions or exudates.  Teeth in good repair. Neck:  No deformities, masses, or tenderness noted. Lungs:  Normal respiratory effort, chest expands symmetrically. Lungs are clear to auscultation, no crackles or wheezes. Heart:  Normal rate and regular rhythm. S1 and S2 normal without gallop, murmur, click, rub or other extra sounds. Abdomen:  Bowel sounds positive,abdomen soft and non-tender without masses, organomegaly or hernias noted. Msk:  No deformity or scoliosis noted of thoracic or lumbar spine.   Pulses:  R and L carotid,radial,femoral,dorsalis pedis and posterior tibial pulses are full and equal bilaterally Extremities:  No clubbing, cyanosis, edema, or deformity noted with normal full range of motion of all joints.     Impression & Recommendations:  Problem # 1:  HYPERTENSION (ICD-401.9)  His updated medication list for this problem includes:    Carvedilol 12.5 Mg Tabs (Carvedilol) .[name]... 1 two times a day  His updated medication list for this problem includes:    Carvedilol 12.5 Mg Tabs (Carvedilol) .[name]... 1 two times a  day  Problem # 2:  DIABETES MELLITUS, TYPE II (ICD-250.00)  His updated medication list for this problem  includes:    Glyburide 5 Mg Tabs (Glyburide) .[name]... 1/2 bid    Metformin Hcl 1000 Mg Tabs (Metformin hcl) .[name]... 1 by mouth once daily twice daily    Aspir-low 81 Mg Tbec (Aspirin) ..... One daily    His updated medication list for this problem includes:    Glyburide 5 Mg Tabs (Glyburide) .[name]... 1/2 bid    Metformin Hcl 1000 Mg Tabs (Metformin hcl) .[name]... 1 by mouth once daily twice daily    Aspir-low 81 Mg Tbec (Aspirin) ..... One daily  Orders: Venipuncture (16109) TLB-A1C / Hgb A1C (Glycohemoglobin) (83036-A1C) Specimen Handling (60454)  Problem # 3:  HYPERLIPIDEMIA (ICD-272.4)  The following medications were removed from the medication list:    Pravachol 20 Mg Tabs (Pravastatin sodium) ..... One daily His updated medication list for this problem includes:    Niacin 250 Mg Tabs (Niacin) ..... Qd  The following medications were removed from the medication list:    Pravachol 20 Mg Tabs (Pravastatin sodium) ..... One daily His updated medication list for this problem includes:    Niacin 250 Mg Tabs (Niacin) ..... Qd  Complete Medication List: 1)  Glyburide 5 Mg Tabs (Glyburide) .... 1/2 bid 2)  Metformin Hcl 1000 Mg Tabs (Metformin hcl) .[name].. 1 by mouth once daily twice daily 3)  Carvedilol 12.5 Mg Tabs (Carvedilol) .[name].. 1 two times a day 4)  Travatan 0.004 % Soln (Travoprost) .... Uad 5)  Accu-chek Softclix Lancets Misc (Lancets) .... Use daily 6)  Accu-chek Aviva Strp (Glucose blood) .... Once daily 7)  Flax Seeds Powd (Flaxseed (linseed)) .... Once daily 8)  Multivitamins Caps (Multiple vitamin) .... Once daily 9)  Vision Formula Tabs (Multiple vitamins-minerals) .... Once daily 10)  Aspir-low 81 Mg Tbec (Aspirin) .... One daily 11)  Niacin 250 Mg Tabs (Niacin) .... Qd 12)  Nitrostat 0.4 Mg Subl (Nitroglycerin) .... One sublingually as needed for chest pain  Patient Instructions: 1)  Please schedule a follow-up appointment in 3 months. 2)  It is important that you exercise  regularly at least 20 minutes 5 times a week. If you develop chest pain, have severe difficulty breathing, or feel very tired , stop exercising immediately and seek medical attention.  Max HR 120  3)  You need to lose weight. Consider a lower calorie diet and regular exercise.  4)  Advised not to eat any food or drink any liquids after 10 PM the night before your procedure. 5)  Check your blood sugars regularly. If your readings are usually above : or below 70 you should contact our office. 6)  It is important that your Diabetic A1c level is checked every 3 months for CPX  7)  See your eye doctor yearly to check for diabetic eye damage. Prescriptions: METFORMIN HCL 1000 MG TABS (METFORMIN HCL) 1 by mouth once daily twice daily  #180 x 6   Entered and Authorized by:   Gordy Savers  MD   Signed by:   Gordy Savers  MD on 03/12/2010   Method used:   Electronically to        [facility]  [phone]* (retail)       [address]       [place], [place]  [address]       Ph: [phone] or [phone]  Fax: [phone]   RxID:   [id number]    Orders Added: 1)  Venipuncture [84696] 2)  TLB-A1C / Hgb A1C (Glycohemoglobin) [83036-A1C] 3)  Est. Patient Level IV [29528] 4)  Specimen Handling [99000]

## 2010-03-28 NOTE — Assessment & Plan Note (Signed)
Summary: continued pruritis/dm   Vital Signs:  Patient profile:   77 year old male Weight:      168 pounds Temp:     98.0 degrees F oral BP sitting:   112 / 50  (left arm) Cuff size:   regular  Vitals Entered By: Raechel Ache, RN (February 27, 2009 10:59 AM) CC: Rash no better- prednisone didn't help. Is Patient Diabetic? Yes   CC:  Rash no better- prednisone didn't help.[name]  History of Present Illness: 77 year old patient seen today complaining of persistent fairly generalized pruritic dermatitis.  He feels that it is related to a new formulation of nifedipine that he has obtained from the [facility] hospital system.  He has seen dermatology in the past; blood  pressure today is low normal.  He was treated with a prednisone dose pack without benefit.  He states that topical  Medications can aggravate the dermatitis  Allergies: No Known Drug Allergies  Physical Exam  General:  Well-developed,well-nourished,in no acute distress; alert,appropriate and cooperative throughout examination; the pressure 100/70 Skin:  generalized and maculopapular dermatitis.  It spares the face and distal extremities   Impression & Recommendations:  Problem # 1:  DERMATITIS (ICD-692.9)  The following medications were removed from the medication list:    Prednisone (pak) 10 Mg Tabs (Prednisone) .[name]... As directed  Problem # 2:  HYPERTENSION (ICD-401.9)  His updated medication list for this problem includes:    Lisinopril 20 Mg Tabs (Lisinopril) .[name]... Take 1 tablet by mouth two times a day    Nifedipine 30 Mg Tb24 (Nifedipine) .[name]... 1 once daily    Carvedilol 12.5 Mg Tabs (Carvedilol) .[name]... 1 two times a day will put the nifedipine on hold  Complete Medication List: 1)  Glyburide 5 Mg Tabs (Glyburide) .[name].. 1  twice daily 2)  Lisinopril 20 Mg Tabs (Lisinopril) .... Take 1 tablet by mouth two times a day 3)  Metformin Hcl 1000 Mg Tabs (Metformin hcl) .... Take 1 tablet by mouth twice a day 4)  Nifedipine  30 Mg Tb24 (Nifedipine) .[name].. 1 once daily 5)  Carvedilol 12.5 Mg Tabs (Carvedilol) .[name].. 1 two times a day 6)  Lovastatin 20 Mg Tabs (Lovastatin) .... 2 once daily 7)  Travatan 0.004 % Soln (Travoprost) .... Uad 8)  Buffered Aspirin 325 Mg Tabs (Aspirin buf(cacarb-mgcarb-mgo)) .[name].. 1 once daily 9)  Niacin Cr 1000 Mg Cr-tabs (Niacin) .[name].. 1 once daily 10)  Accu-chek Softclix Lancets Misc (Lancets) .... Use daily 11)  Accu-chek Comfort Curve Strp (Glucose blood) .... Use daily 12)  Hydroxyzine Hcl 25 Mg Tabs (Hydroxyzine hcl) .... One or two every 6 hours as needed for itching  Patient Instructions: 1)  Limit your Sodium (Salt). 2)  Please schedule a follow-up appointment as needed. 3)  dermatology follow-up if needed Prescriptions: HYDROXYZINE HCL 25 MG TABS (HYDROXYZINE HCL) one or two every 6 hours as needed for itching  #50 x 2   Entered and Authorized by:   Gordy Savers  MD   Signed by:   Gordy Savers  MD on 02/27/2009   Method used:   Print then Give to Patient   RxID:   [id number]

## 2010-03-28 NOTE — Letter (Signed)
Summary: [facility]   Imported By: Maryln Gottron 01/24/2010 14:15:24  _____________________________________________________________________  External Attachment:    Type:   Image     Comment:   External Document

## 2010-03-28 NOTE — Progress Notes (Signed)
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    Initial call taken by: Debbra Riding,  December 11, 2009 10:12 AM    Prescriptions: NITROSTAT 0.4 MG SUBL (NITROGLYCERIN) one sublingually as needed for chest pain  #6 x 1   Entered by:   Duard Brady LPN   Authorized by:   Gordy Savers  MD   Signed by:   Duard Brady LPN on 09/81/1914   Method used:   Electronically to        [facility]  [phone]* (retail)       [address]       [place], [place]  [address]       Ph: [phone] or [phone]       Fax: [phone]   RxID:   [id number]

## 2010-03-28 NOTE — Assessment & Plan Note (Signed)
Summary: 4 wk rov/mm   Vital Signs:  Patient profile:   77 year old male Weight:      168 pounds Temp:     98.4 degrees F oral BP sitting:   126 / 66  (left arm) Cuff size:   regular  Vitals Entered By: Raechel Ache, RN (March 13, 2009 11:04 AM) CC: F/u rash- saw dermatologist, on Tetracycline and light tx's.  BS 80 Is Patient Diabetic? Yes   CC:  F/u rash- saw dermatologist and on Tetracycline and light tx's.  BS 80.  History of Present Illness: 77 year old patient who is seen today for follow-up of his hypertension and type 2 diabetes.  He is followed closely by dermatology for a diffuse pruritic, erythematous, maculopapular rash.  His blood sugar and blood pressures have remained stable in spite of dose reductions.  He feels well.  He does have a history of coronary artery disease, which has been stable.  Allergies: No Known Drug Allergies  Review of Systems       The patient complains of anorexia, weight loss, and suspicious skin lesions.  The patient denies fever, weight gain, vision loss, decreased hearing, hoarseness, chest pain, syncope, dyspnea on exertion, peripheral edema, prolonged cough, headaches, hemoptysis, abdominal pain, melena, hematochezia, severe indigestion/heartburn, hematuria, incontinence, genital sores, muscle weakness, transient blindness, difficulty walking, depression, unusual weight change, abnormal bleeding, enlarged lymph nodes, angioedema, breast masses, and testicular masses.    Physical Exam  General:  overweight-appearing.  no distress.  Blood pressure low normal Head:  Normocephalic and atraumatic without obvious abnormalities. No apparent alopecia or balding. Mouth:  Oral mucosa and oropharynx without lesions or exudates.  Teeth in good repair. Neck:  No deformities, masses, or tenderness noted. Lungs:  Normal respiratory effort, chest expands symmetrically. Lungs are clear to auscultation, no crackles or wheezes. Heart:  Normal rate and  regular rhythm. S1 and S2 normal without gallop, murmur, click, rub or other extra sounds. Abdomen:  Bowel sounds positive,abdomen soft and non-tender without masses, organomegaly or hernias noted. Skin:  generalized erythematous maculopapular rash, most marked centrally.  Recent biopsy site noted.  Right lateral chest wall region Cervical Nodes:  No lymphadenopathy noted   Impression & Recommendations:  Problem # 1:  HYPERTENSION (ICD-401.9)  His updated medication list for this problem includes:    Lisinopril 20 Mg Tabs (Lisinopril) .[name]... Take 1 tablet by mouth daily    Nifedipine 30 Mg Tb24 (Nifedipine) .[name]... 1 once daily    Carvedilol 12.5 Mg Tabs (Carvedilol) .[name]... 1 two times a day  His updated medication list for this problem includes:    Lisinopril 20 Mg Tabs (Lisinopril) .[name]... Take 1 tablet by mouth daily    Nifedipine 30 Mg Tb24 (Nifedipine) .[name]... 1 once daily    Carvedilol 12.5 Mg Tabs (Carvedilol) .[name]... 1 two times a day  Problem # 2:  DIABETES MELLITUS, TYPE II (ICD-250.00)  His updated medication list for this problem includes:    Glyburide 5 Mg Tabs (Glyburide) .[name]... 1  every a.m., only    Lisinopril 20 Mg Tabs (Lisinopril) .[name]... Take 1 tablet by mouth daily    Metformin Hcl 1000 Mg Tabs (Metformin hcl) .[name]... Take 1 tablet by mouth daily    Buffered Aspirin 325 Mg Tabs (Aspirin buf(cacarb-mgcarb-mgo)) .[name]... 1 once daily    His updated medication list for this problem includes:    Glyburide 5 Mg Tabs (Glyburide) .[name]... 1  every a.m., only    Lisinopril 20 Mg Tabs (Lisinopril) .Marland KitchenMarland KitchenMarland KitchenMarland Kitchen  Take 1 tablet by mouth daily    Metformin Hcl 1000 Mg Tabs (Metformin hcl) .[name]... Take 1 tablet by mouth daily    Buffered Aspirin 325 Mg Tabs (Aspirin buf(cacarb-mgcarb-mgo)) .[name]... 1 once daily  Orders: Venipuncture (81191) TLB-A1C / Hgb A1C (Glycohemoglobin) (83036-A1C)  Complete Medication List: 1)  Glyburide 5 Mg Tabs (Glyburide) .[name].. 1  every a.m., only 2)  Lisinopril 20 Mg Tabs  (Lisinopril) .... Take 1 tablet by mouth daily 3)  Metformin Hcl 1000 Mg Tabs (Metformin hcl) .... Take 1 tablet by mouth daily 4)  Nifedipine 30 Mg Tb24 (Nifedipine) .[name].. 1 once daily 5)  Carvedilol 12.5 Mg Tabs (Carvedilol) .[name].. 1 two times a day 6)  Lovastatin 20 Mg Tabs (Lovastatin) .... 2 once daily 7)  Travatan 0.004 % Soln (Travoprost) .... Uad 8)  Buffered Aspirin 325 Mg Tabs (Aspirin buf(cacarb-mgcarb-mgo)) .[name].. 1 once daily 9)  Niacin Cr 1000 Mg Cr-tabs (Niacin) .[name].. 1 once daily 10)  Accu-chek Softclix Lancets Misc (Lancets) .... Use daily 11)  Accu-chek Comfort Curve Strp (Glucose blood) .... Use daily 12)  Hydroxyzine Hcl 25 Mg Tabs (Hydroxyzine hcl) .... One or two every 6 hours as needed for itching 13)  Zolpidem Tartrate 10 Mg Tabs (Zolpidem tartrate) .... One at bedtime as needed for sleep  Patient Instructions: 1)  Please schedule a follow-up appointment in 3 months. 2)  Limit your Sodium (Salt). 3)  It is important that you exercise regularly at least 20 minutes 5 times a week. If you develop chest pain, have severe difficulty breathing, or feel very tired , stop exercising immediately and seek medical attention. 4)  You need to lose weight. Consider a lower calorie diet and regular exercise.  5)  Check your blood sugars regularly. If your readings are usually above : or below 70 you should contact our office. 6)  It is important that your Diabetic A1c level is checked every 3 months. 7)  See your eye doctor yearly to check for diabetic eye damage. Prescriptions: ZOLPIDEM TARTRATE 10 MG TABS (ZOLPIDEM TARTRATE) one at bedtime as needed for sleep  #30 x 4   Entered and Authorized by:   Gordy Savers  MD   Signed by:   Gordy Savers  MD on 03/13/2009   Method used:   Print then Give to Patient   RxID:   [id number] LOVASTATIN 20 MG  TABS (LOVASTATIN) 2 once daily  #180 x 6   Entered and Authorized by:   Gordy Savers  MD   Signed by:   Gordy Savers  MD on 03/13/2009   Method used:   Print then Give to Patient   RxID:   [id number] CARVEDILOL 12.5 MG  TABS (CARVEDILOL) 1 two times a day  #180 x 6   Entered and Authorized by:   Gordy Savers  MD   Signed by:   Gordy Savers  MD on 03/13/2009   Method used:   Print then Give to Patient   RxID:   [id number] METFORMIN HCL 1000 MG TABS (METFORMIN HCL) Take 1 tablet by mouth daily  #90 x 4   Entered and Authorized by:   Gordy Savers  MD   Signed by:   Gordy Savers  MD on 03/13/2009   Method used:   Print then Give to Patient   RxID:   [id number] LISINOPRIL 20 MG TABS (LISINOPRIL) Take 1 tablet by mouth daily  #90 x 4   Entered  and Authorized by:   Gordy Savers  MD   Signed by:   Gordy Savers  MD on 03/13/2009   Method used:   Print then Give to Patient   RxID:   [id number] GLYBURIDE 5 MG  TABS (GLYBURIDE) 1  every a.m., only  #90 x 4   Entered and Authorized by:   Gordy Savers  MD   Signed by:   Gordy Savers  MD on 03/13/2009   Method used:   Print then Give to Patient   RxID:   [id number]

## 2010-03-28 NOTE — Letter (Signed)
Summary: [facility]   Imported By: Maryln Gottron 07/18/2009 10:58:04  _____________________________________________________________________  External Attachment:    Type:   Image     Comment:   External Document

## 2010-03-28 NOTE — Assessment & Plan Note (Signed)
Summary: 3 MONTH ROV/NJR/PT RSC/CJR   Vital Signs:  Patient profile:   76 year old male Weight:      170 pounds Temp:     98.5 degrees F oral BP sitting:   140 / 90  (right arm) Cuff size:   regular  Vitals Entered By: Duard Brady LPN (December 10, 2009 3:30 PM) CC: 3 mos rov - doing ok     fbs 106 Is Patient Diabetic? Yes Did you bring your meter with you today? Yes   CC:  3 mos rov - doing ok     fbs 106.  History of Present Illness: 77 year old patient who is seen today for follow-up of his type 2 diabetes.  He has hypertension and dyslipidemia.  He states he is no longer taking statin therapy due to myalgias.  Is also decreased his metformin to 1000 mg twice daily to once daily only.  His last hemoglobin A1c was elevated.  He denies any cardiac symptoms.  Problems Prior to Update: 1)  Dermatitis  (ICD-692.9) 2)  Special Screening Malignant Neoplasm of Prostate  (ICD-V76.44) 3)  Health Screening  (ICD-V70.0) 4)  Hypertension  (ICD-401.9) 5)  Hyperlipidemia  (ICD-272.4) 6)  Diabetes Mellitus, Type II  (ICD-250.00) 7)  Coronary Artery Disease  (ICD-414.00)  Medications Prior to Update: 1)  Glyburide 5 Mg  Tabs (Glyburide) .... 1/2 Bid 2)  Metformin Hcl 1000 Mg Tabs (Metformin Hcl) .[name].. 1 Twice Daily 3)  Carvedilol 12.5 Mg  Tabs (Carvedilol) .[name].. 1 Two Times A Day 4)  Travatan 0.004 %  Soln (Travoprost) .... Uad 5)  Accu-Chek Softclix Lancets  Misc (Lancets) .... Use Daily 6)  Accu-Chek Aviva  Strp (Glucose Blood) .... Once Daily 7)  Flax Seeds  Powd (Flaxseed (Linseed)) .... Once Daily 8)  Multivitamins  Caps (Multiple Vitamin) .... Once Daily 9)  Vision Formula  Tabs (Multiple Vitamins-Minerals) .... Once Daily 10)  Lovastatin 20 Mg Tabs (Lovastatin) .... One Daily 11)  Aspir-Low 81 Mg Tbec (Aspirin) .... One Daily  Allergies: 1)  ! Lisinopril (Lisinopril)  Past History:  Past Medical History: Reviewed history from 07/14/2007 and no changes  required. Coronary artery disease; status post PTCA in 2001; status post CABG 2007 Diabetes mellitus, type II; 1998 Hyperlipidemia Hypertension; 1998  Past Surgical History: Reviewed history from 04/14/2007 and no changes required. Coronary artery bypass graft PTCA/stent Vasectomy Tonsillectomy Cardiolite  stress test in November 2007 colonoscopy January 2009  Review of Systems  The patient denies anorexia, fever, weight loss, weight gain, vision loss, decreased hearing, hoarseness, chest pain, syncope, dyspnea on exertion, peripheral edema, prolonged cough, headaches, hemoptysis, abdominal pain, melena, hematochezia, severe indigestion/heartburn, hematuria, incontinence, genital sores, muscle weakness, suspicious skin lesions, transient blindness, difficulty walking, depression, unusual weight change, abnormal bleeding, enlarged lymph nodes, angioedema, breast masses, and testicular masses.    Physical Exam  General:  overweight-appearing.  blood pressure, normaloverweight-appearing.   Head:  Normocephalic and atraumatic without obvious abnormalities. No apparent alopecia or balding. Eyes:  cataract, left Mouth:  Oral mucosa and oropharynx without lesions or exudates.  Teeth in good repair. Neck:  No deformities, masses, or tenderness noted. Chest Wall:  sternotomy scar Lungs:  Normal respiratory effort, chest expands symmetrically. Lungs are clear to auscultation, no crackles or wheezes. Heart:  Normal rate and regular rhythm. S1 and S2 normal without gallop, murmur, click, rub or other extra sounds. Abdomen:  Bowel sounds positive,abdomen soft and non-tender without masses, organomegaly or hernias noted. Msk:  No deformity or  scoliosis noted of thoracic or lumbar spine.   Pulses:  R and L carotid,radial,femoral,dorsalis pedis and posterior tibial pulses are full and equal bilaterally Extremities:  No clubbing, cyanosis, edema, or deformity noted with normal full range of motion of  all joints.     Impression & Recommendations:  Problem # 1:  HYPERTENSION (ICD-401.9)  His updated medication list for this problem includes:    Carvedilol 12.5 Mg Tabs (Carvedilol) .[name]... 1 two times a day  His updated medication list for this problem includes:    Carvedilol 12.5 Mg Tabs (Carvedilol) .[name]... 1 two times a day  Problem # 2:  HYPERLIPIDEMIA (ICD-272.4)  The following medications were removed from the medication list:    Lovastatin 20 Mg Tabs (Lovastatin) ..... One daily His updated medication list for this problem includes:    Niacin 250 Mg Tabs (Niacin) ..... Qd    Pravachol 20 Mg Tabs (Pravastatin sodium) ..... One daily  The following medications were removed from the medication list:    Lovastatin 20 Mg Tabs (Lovastatin) ..... One daily His updated medication list for this problem includes:    Niacin 250 Mg Tabs (Niacin) ..... Qd    Pravachol 20 Mg Tabs (Pravastatin sodium) ..... One daily  Problem # 3:  DIABETES MELLITUS, TYPE II (ICD-250.00)  His updated medication list for this problem includes:    Glyburide 5 Mg Tabs (Glyburide) .[name]... 1/2 bid    Metformin Hcl 1000 Mg Tabs (Metformin hcl) .[name]... 1 by mouth qd    Aspir-low 81 Mg Tbec (Aspirin) ..... One daily  Orders: Venipuncture (59563) TLB-A1C / Hgb A1C (Glycohemoglobin) (83036-A1C)  His updated medication list for this problem includes:    Glyburide 5 Mg Tabs (Glyburide) .[name]... 1/2 bid    Metformin Hcl 1000 Mg Tabs (Metformin hcl) .[name]... 1 by mouth qd    Aspir-low 81 Mg Tbec (Aspirin) ..... One daily  Problem # 4:  CORONARY ARTERY DISEASE (ICD-414.00)  His updated medication list for this problem includes:    Carvedilol 12.5 Mg Tabs (Carvedilol) .[name]... 1 two times a day    Aspir-low 81 Mg Tbec (Aspirin) ..... One daily    Nitrostat 0.4 Mg Subl (Nitroglycerin) .[name]... Place 1 under tongue sl as needed chest pain    Nitrostat 0.4 Mg Subl (Nitroglycerin) ..... One sublingually as needed for chest  pain  His updated medication list for this problem includes:    Carvedilol 12.5 Mg Tabs (Carvedilol) .[name]... 1 two times a day    Aspir-low 81 Mg Tbec (Aspirin) ..... One daily    Nitrostat 0.4 Mg Subl (Nitroglycerin) .[name]... Place 1 under tongue sl as needed chest pain    Nitrostat 0.4 Mg Subl (Nitroglycerin) ..... One sublingually as needed for chest pain  Complete Medication List: 1)  Glyburide 5 Mg Tabs (Glyburide) .... 1/2 bid 2)  Metformin Hcl 1000 Mg Tabs (Metformin hcl) .[name].. 1 by mouth qd 3)  Carvedilol 12.5 Mg Tabs (Carvedilol) .[name].. 1 two times a day 4)  Travatan 0.004 % Soln (Travoprost) .... Uad 5)  Accu-chek Softclix Lancets Misc (Lancets) .... Use daily 6)  Accu-chek Aviva Strp (Glucose blood) .... Once daily 7)  Flax Seeds Powd (Flaxseed (linseed)) .... Once daily 8)  Multivitamins Caps (Multiple vitamin) .... Once daily 9)  Vision Formula Tabs (Multiple vitamins-minerals) .... Once daily 10)  Aspir-low 81 Mg Tbec (Aspirin) .... One daily 11)  Niacin 250 Mg Tabs (Niacin) .... Qd 12)  Nitrostat 0.4 Mg Subl (Nitroglycerin) .... Place 1 under  tongue sl as needed chest pain 13)  Pravachol 20 Mg Tabs (Pravastatin sodium) .... One daily 14)  Nitrostat 0.4 Mg Subl (Nitroglycerin) .... One sublingually as needed for chest pain  Other Orders: Specimen Handling (98119)  Patient Instructions: 1)  Please schedule a follow-up appointment in 3 months. 2)  Limit your Sodium (Salt). 3)  It is important that you exercise regularly at least 20 minutes 5 times a week. If you develop chest pain, have severe difficulty breathing, or feel very tired , stop exercising immediately and seek medical attention. 4)  You need to lose weight. Consider a lower calorie diet and regular exercise.  5)  Check your blood sugars regularly. If your readings are usually above : or below 70 you should contact our office. 6)  It is important that your Diabetic A1c level is checked every 3 months. 7)  See your eye  doctor yearly to check for diabetic eye damage. Prescriptions: PRAVACHOL 20 MG TABS (PRAVASTATIN SODIUM) one daily  #90 x 6   Entered and Authorized by:   Gordy Savers  MD   Signed by:   Gordy Savers  MD on 12/10/2009   Method used:   Electronically to        [facility]  [phone]* (retail)       [address]       [place], [place]  [address]       Ph: [phone] or [phone]       Fax: [phone]   RxID:   [id number] ACCU-CHEK AVIVA  STRP (GLUCOSE BLOOD) once daily  #100 x 6   Entered and Authorized by:   Gordy Savers  MD   Signed by:   Gordy Savers  MD on 12/10/2009   Method used:   Electronically to        [facility]  [phone]* (retail)       [address]       [place], [place]  [address]       Ph: [phone] or [phone]       Fax: [phone]   RxID:   [id number] ACCU-CHEK SOFTCLIX LANCETS  MISC (LANCETS) use daily  #90 x 6   Entered and Authorized by:   Gordy Savers  MD   Signed by:   Gordy Savers  MD on 12/10/2009   Method used:   Electronically to        [facility]  [phone]* (retail)       [address]       [place], [place]  [address]       Ph: [phone] or [phone]       Fax: [phone]   RxID:   [id number] CARVEDILOL 12.5 MG  TABS (CARVEDILOL) 1 two times a day  #180 x 6   Entered and Authorized by:   Gordy Savers  MD   Signed by:   Gordy Savers  MD on 12/10/2009   Method used:   Electronically to        [facility]  [phone]* (retail)       [address]       [place], [place]  [address]       Ph: [phone] or [phone]  Fax: [phone]   RxID:   [id number] METFORMIN HCL 1000 MG TABS (METFORMIN HCL) 1 by mouth qd  #90 x 0   Entered and Authorized by:   Gordy Savers  MD   Signed by:   Gordy Savers   MD on 12/10/2009   Method used:   Electronically to        [facility]  [phone]* (retail)       [address]       [place], [place]  [address]       Ph: [phone] or [phone]       Fax: [phone]   RxID:   [id number] GLYBURIDE 5 MG  TABS (GLYBURIDE) 1/2 bid  #90 x 6   Entered and Authorized by:   Gordy Savers  MD   Signed by:   Gordy Savers  MD on 12/10/2009   Method used:   Electronically to        [facility]  [phone]* (retail)       [address]       [place], [place]  [address]       Ph: [phone] or [phone]       Fax: [phone]   RxID:   [id number]    Orders Added: 1)  Est. Patient Level IV [82505] 2)  Venipuncture [39767] 3)  TLB-A1C / Hgb A1C (Glycohemoglobin) [83036-A1C] 4)  Specimen Handling [99000] 5)  Venipuncture [34193]

## 2010-06-13 ENCOUNTER — Encounter: Payer: Self-pay | Admitting: Internal Medicine

## 2010-06-18 ENCOUNTER — Ambulatory Visit (INDEPENDENT_AMBULATORY_CARE_PROVIDER_SITE_OTHER): Payer: Medicare Other | Admitting: Internal Medicine

## 2010-06-18 ENCOUNTER — Encounter: Payer: Self-pay | Admitting: Internal Medicine

## 2010-06-18 DIAGNOSIS — E119 Type 2 diabetes mellitus without complications: Secondary | ICD-10-CM

## 2010-06-18 DIAGNOSIS — E785 Hyperlipidemia, unspecified: Secondary | ICD-10-CM

## 2010-06-18 DIAGNOSIS — I2510 Atherosclerotic heart disease of native coronary artery without angina pectoris: Secondary | ICD-10-CM

## 2010-06-18 DIAGNOSIS — Z0000 Encounter for general adult medical examination without abnormal findings: Secondary | ICD-10-CM

## 2010-06-18 DIAGNOSIS — I10 Essential (primary) hypertension: Secondary | ICD-10-CM

## 2010-06-18 LAB — BASIC METABOLIC PANEL
BUN: 13 mg/dL (ref 6–23)
CO2: 28 mEq/L (ref 19–32)
Chloride: 101 mEq/L (ref 96–112)
Glucose, Bld: 97 mg/dL (ref 70–99)
Potassium: 4.3 mEq/L (ref 3.5–5.1)
Sodium: 136 mEq/L (ref 135–145)

## 2010-06-18 LAB — HEPATIC FUNCTION PANEL
AST: 18 U/L (ref 0–37)
Albumin: 4.1 g/dL (ref 3.5–5.2)
Total Bilirubin: 0.9 mg/dL (ref 0.3–1.2)

## 2010-06-18 LAB — TSH: TSH: 3.64 u[IU]/mL (ref 0.35–5.50)

## 2010-06-18 LAB — CBC WITH DIFFERENTIAL/PLATELET
Basophils Absolute: 0.1 10*3/uL (ref 0.0–0.1)
Eosinophils Absolute: 0.2 10*3/uL (ref 0.0–0.7)
HCT: 44.9 % (ref 39.0–52.0)
Hemoglobin: 15.5 g/dL (ref 13.0–17.0)
Lymphs Abs: 1.9 10*3/uL (ref 0.7–4.0)
MCHC: 34.5 g/dL (ref 30.0–36.0)
MCV: 93.1 fl (ref 78.0–100.0)
Monocytes Absolute: 0.9 10*3/uL (ref 0.1–1.0)
Monocytes Relative: 10.1 % (ref 3.0–12.0)
Neutro Abs: 5.5 10*3/uL (ref 1.4–7.7)
Platelets: 335 10*3/uL (ref 150.0–400.0)
RDW: 13 % (ref 11.5–14.6)

## 2010-06-18 LAB — LIPID PANEL
Cholesterol: 242 mg/dL — ABNORMAL HIGH (ref 0–200)
HDL: 36.9 mg/dL — ABNORMAL LOW (ref >39.00)
Total CHOL/HDL Ratio: 7
Triglycerides: 181 mg/dL — ABNORMAL HIGH (ref 0.0–149.0)

## 2010-06-18 NOTE — Progress Notes (Signed)
Subjective:    Patient ID: Johnny End., male    DOB: [birth year], 77 y.o.   MRN: [id number]  HPI  is 77 year old patient who is seen today for an annual exam. He is followed by cardiology biannually for coronary artery disease. This has been stable. He has type 2 diabetes on dual therapy. No concerns or complaints today. He has dyslipidemia controlled on niacin.  1. Risk factors, based on past  M,S,F history-  patient has known coronary artery disease coronary vascular risk factors include diabetes dyslipidemia and hypertension  2.  Physical activities: Remains quite active physically he goes to a health club 3 times weekly 3.  Depression/mood: No history of depression or mood disorder  4.  Hearing: Mild hearing deficit  5.  ADL's: Independent in all aspects of daily living 6.  Fall risk: Low  7.  Home safety: No problems identified  8.  Height weight, and visual acuity; height and weight stable no change in visual acuity has a cataract extraction surgery on the left. Is followed by ophthalmology at least annually  9.  Counseling: Heart healthy diet regular exercise modest weight loss all encouraged low sodium diet encouraged 10. Lab orders based on risk factors:  11. Referral: Followup cardiology and ophthalmology as planned  12. Care plan: Heart healthy diet modest weight loss all encouraged cardiology followup encouraged  13. Cognitive assessment: Alert and oriented with normal affect. No cognitive dysfunction.       Review of Systems  Constitutional: Negative for fever, chills, activity change, appetite change and fatigue.  HENT: Negative for hearing loss, ear pain, congestion, rhinorrhea, sneezing, mouth sores, trouble swallowing, neck pain, neck stiffness, dental problem, voice change, sinus pressure and tinnitus.   Eyes: Negative for photophobia, pain, redness and visual disturbance.  Respiratory: Negative for apnea, cough, choking, chest tightness, shortness of  breath and wheezing.   Cardiovascular: Negative for chest pain, palpitations and leg swelling.  Gastrointestinal: Negative for nausea, vomiting, abdominal pain, diarrhea, constipation, blood in stool, abdominal distention, anal bleeding and rectal pain.  Genitourinary: Negative for dysuria, urgency, frequency, hematuria, flank pain, decreased urine volume, discharge, penile swelling, scrotal swelling, difficulty urinating, genital sores and testicular pain.  Musculoskeletal: Negative for myalgias, back pain, joint swelling, arthralgias and gait problem.  Skin: Negative for color change, rash and wound.  Neurological: Negative for dizziness, tremors, seizures, syncope, facial asymmetry, speech difficulty, weakness, light-headedness, numbness and headaches.  Hematological: Negative for adenopathy. Does not bruise/bleed easily.  Psychiatric/Behavioral: Negative for suicidal ideas, hallucinations, behavioral problems, confusion, sleep disturbance, self-injury, dysphoric mood, decreased concentration and agitation. The patient is not nervous/anxious.        Objective:   Physical Exam  Constitutional: He appears well-developed and well-nourished.  HENT:  Head: Normocephalic and atraumatic.  Right Ear: External ear normal.  Left Ear: External ear normal.  Nose: Nose normal.  Mouth/Throat: Oropharynx is clear and moist.  Eyes: Conjunctivae and EOM are normal. Pupils are equal, round, and reactive to light. No scleral icterus.  Neck: Normal range of motion. Neck supple. No JVD present. No thyromegaly present.  Cardiovascular: Normal rate, regular rhythm and normal heart sounds.  Exam reveals no gallop and no friction rub.   No murmur heard.      Sternotomy scar Faint pedal pulses  Pulmonary/Chest: Effort normal and breath sounds normal. He exhibits no tenderness.  Abdominal: Soft. Bowel sounds are normal. He exhibits no distension and no mass. There is no tenderness.  Genitourinary: Prostate  normal  and penis normal.  Musculoskeletal: Normal range of motion. He exhibits no edema and no tenderness.  Lymphadenopathy:    He has no cervical adenopathy.  Neurological: He is alert. He has normal reflexes. No cranial nerve deficit. Coordination normal.  Skin: Skin is warm and dry. No rash noted.       Onychomycotic nails  Psychiatric: He has a normal mood and affect. His behavior is normal.          Assessment & Plan:   Unremarkable annual exam Coronary artery disease. Clinically stable. Type 2 diabetes. We'll check a hemoglobin A1c Dyslipidemia. We'll check a lipid profile  Lifestyle issues addressed. We'll recheck in 3 months

## 2010-06-18 NOTE — Patient Instructions (Signed)
You need to lose weight.  Consider a lower calorie diet and regular exercise.  Limit your sodium (Salt) intake  Please check your blood pressure on a regular basis.  If it is consistently greater than 150/90, please make an office appointment.   Please check your hemoglobin A1c every 3 months

## 2010-07-04 ENCOUNTER — Encounter: Payer: Self-pay | Admitting: *Deleted

## 2010-07-05 ENCOUNTER — Encounter: Payer: Self-pay | Admitting: Cardiovascular Disease

## 2010-07-05 ENCOUNTER — Ambulatory Visit (INDEPENDENT_AMBULATORY_CARE_PROVIDER_SITE_OTHER): Payer: Medicare Other | Admitting: Cardiovascular Disease

## 2010-07-05 VITALS — BP 124/70 | HR 70 | Wt 168.0 lb

## 2010-07-05 DIAGNOSIS — I2510 Atherosclerotic heart disease of native coronary artery without angina pectoris: Secondary | ICD-10-CM

## 2010-07-05 NOTE — Progress Notes (Signed)
Johnny [name]. Date of Birth  1932-07-23 [facility] / [facility] [address]     [address] [place], [place]  [address] [phone]  Fax  [phone]  History of Present Illness:  Pt is doing well.  No chest pain or dyspnea.   Exercising some .   He has a history of hypercholesterolemia but has tried all of the available statins. He refuses to try any additional set medications. He's been taking fish oil With with some success.   Current Outpatient Prescriptions on File Prior to Visit  Medication Sig Dispense Refill  . ACCU-CHEK SOFTCLIX LANCETS lancets by Other route daily. Use as instructed       . aspirin 81 MG tablet Take 81 mg by mouth daily.        . carvedilol (COREG) 12.5 MG tablet Take 12.5 mg by mouth 2 (two) times daily with a meal.        . Flaxseed, Linseed, POWD Take by mouth daily.        [name] glucose blood (ACCU-CHEK AVIVA) test strip 1 each by Other route daily. Use as instructed       . glyBURIDE (DIABETA) 5 MG tablet Take 2.5 mg by mouth 2 (two) times daily with a meal.        . Krill Oil 300 MG CAPS Take 1 capsule by mouth daily.        . metFORMIN (GLUCOPHAGE) 1000 MG tablet Take by mouth. 1 tablet qAM and 1/2 tablet qPM      . Multiple Vitamin (MULTIVITAMIN) tablet Take 1 tablet by mouth daily.        . Multiple Vitamins-Minerals (VISION FORMULA) TABS Take by mouth daily.        . nitroGLYCERIN (NITROSTAT) 0.4 MG SL tablet Place 0.4 mg under the tongue every 5 (five) minutes as needed.        . travoprost, benzalkonium, (TRAVATAN) 0.004 % ophthalmic solution 1 drop at bedtime. As directed       . DISCONTD: niacin 250 MG tablet Take 250 mg by mouth daily with breakfast.          Allergies  Allergen Reactions  . Crestor (Rosuvastatin Calcium)     Muscle aches  . Lipitor (Atorvastatin Calcium)     Muscle aches  . Lisinopril     REACTION: rash ?  . Lovastatin     Muscle aches  . Pravastatin     Muscle aches  . Zocor  (Simvastatin - High Dose)     Muscle aches    Past Medical History  Diagnosis Date  . CORONARY ARTERY DISEASE 07/17/2006    CABG x3   . DIABETES MELLITUS, TYPE II 07/17/2006  . HYPERLIPIDEMIA 07/17/2006  . HYPERTENSION 07/17/2006    Past Surgical History  Procedure Date  . Vasectomy   . Tonsillectomy   . Coronary artery bypass graft 01/17/2006    x3 using a left internal mammary artery graft to left anterior descending coronary artery, saphenous vein graft to obtuse marginal branch, saphenous vein graft to posterior descending branch / Endoscopic vein harvesting from the right leg.  . Coronary angioplasty with stent placement 01/09/2006    Three-vessel CAD  His right coronary artery is extremely tight.  He has moderate to severe disease in the LAD & left circumflex artery.  The LAD is very heavily calcified and the proximal and ostial LAD is not a good target for PTCA.  We will refer him to  CVTS for further evaluation  . Coronary angioplasty with stent placement 02/14/2000    EF - of around 65-70%./PTCA and stenting of his mid right  coronary artery (4.0 x 18 mm NIR stent to the mid RCA).  He was noted  to have a 50% ostial stenosis at the time of his heart catheterization     History  Smoking status  . Former Smoker  . Types: Cigarettes  . Quit date: 02/24/1965  Smokeless tobacco  . Never Used    History  Alcohol Use No    Family History  Problem Relation Age of Onset  . Heart attack Father   . Stroke Mother     Reviw of Systems:  Reviewed in the HPI.  All other systems are negative.  Physical Exam: BP 124/70  Pulse 70  Wt 168 lb (76.204 kg) The patient is alert and oriented x 3.  The mood and affect are normal.  The skin is warm and dry.  Color is normal.  The HEENT exam reveals that the sclera are nonicteric.  The mucous membranes are moist.  The carotids are 2+ without bruits.  There is no thyromegaly.  There is no JVD.  The lungs are clear.  The chest wall is non  tender.  The heart exam reveals a regular rate with a normal S1 and S2.  There are no murmurs, gallops, or rubs.  The PMI is not displaced.   Abdominal exam reveals good bowel sounds.  There is no guarding or rebound.  There is no hepatosplenomegaly or tenderness.  There are no masses.  Exam of the legs reveal no clubbing, cyanosis, or edema.  The legs are without rashes.  The distal pulses are intact.  Cranial nerves II - XII are intact.  Motor and sensory functions are intact.  The gait is normal.  Assessment / Plan:

## 2010-07-05 NOTE — Assessment & Plan Note (Signed)
Johnny [name] is doing fairly well from a cardiac standpoint. His cholesterol levels from several weeks ago are still mildly elevated. Unfortunately, he does not tolerate any of the statin medications.  I've asked him to continue with a good exercise program. He continues to watch his diet. I'll see him again in 6 months for office visit, lipid profile, and HFP.

## 2010-07-09 ENCOUNTER — Other Ambulatory Visit: Payer: Self-pay

## 2010-07-09 NOTE — Telephone Encounter (Signed)
recv'd request from [facility] for lovastatin - this was denied for reasons that it was removed from med list 12/10/2009 and placed on adverse reaction list. KIK

## 2010-07-12 NOTE — H&P (Signed)
NAME:  [name], [name] NO.:  [id number]   MEDICAL RECORD NO.:  [id number]            PATIENT TYPE:   LOCATION:                                 FACILITY:   PHYSICIAN:  Vesta Mixer, M.D.      DATE OF BIRTH:   DATE OF ADMISSION:  01/09/2006  DATE OF DISCHARGE:                                HISTORY & PHYSICAL   Johnny [name] is an elderly gentleman with a history of coronary artery  disease.  He is status post PTCA of his right coronary artery in the past.  He is admitted now after having an abnormal stress Cardiolite study.  He is  to have a heart catheterization.   Johnny [name] has a history of silent ischemia.  He has a history of diabetes  mellitus, hyperlipidemia, and hypertension.  He has had significant coronary  artery disease but has never had anything in the way of chest discomfort.  We performed a stress Cardiolite study recently since it had been about 4  years since his previous stress test.  He was found to have evidence of  inferior wall ischemia.  He had normal left ventricular systolic function  with an ejection fraction of 56%.  He had hypokinesis of his inferior wall.  He is denies any episodes of syncope or presyncope.  He denies any chest  pain or shortness of breath.   CURRENT MEDICATIONS:  1. Lisinopril 20 mg a day.  2. Aspirin 81 mg a day.  3. Niaspan a 250 mg p.o. b.i.d.  4. Procardia XL 30 mg a day.  5. Glyburide 2.5 mg p.o. b.i.d.  6. Metformin 1 g p.o. b.i.d.  7. __________  eye drops.  8. Nitroglycerin as needed.  9. Lovastatin 20 mg p.o. b.i.d.   ALLERGIES:  None.   PAST MEDICAL HISTORY:  1. Coronary artery disease, status post PTCA and stenting of his mid right      coronary artery (4.0 x 18 mm NIR stent to the mid RCA).  He was noted      to have a 50% ostial stenosis at the time of his heart catheterization      in 2001.  2. Diabetes mellitus.  3. Hypertension.  4. Hyperlipidemia.   SOCIAL HISTORY:  The patient  quit smoking in 1967.   FAMILY HISTORY:  Unremarkable.   REVIEW OF SYSTEMS:  As noted in the HPI and is otherwise unremarkable.   PHYSICAL EXAMINATION:  GENERAL:  On exam he is an elderly gentleman in no  acute distress.  He is alert and oriented x3, and his mood and affect are  normal.  VITAL SIGNS:  His weight is 182.  His blood pressure is 142/70 with a heart  rate of 80.  HEENT: 2+ carotids.  He has no bruits, no JVD, no thyromegaly.  LUNGS:  Clear to auscultation.  HEART:  Regular rate, S1, S2, with no murmurs.  ABDOMEN:  Good bowel sounds and is nontender.  EXTREMITIES:  He has poor pulses in his right groin.  Has good pulses in his  left groin.  NEUROLOGIC:  Nonfocal.  Cranial nerves II-XII are intact, and his motor and  sensory function are intact.   Johnny [name] presents with an abnormal stress Cardiolite study.  He does not  have any episodes of angina, but he has known silent ischemia.  I have  scheduled him for a diagnostic left heart catheterization.  We will use his  left groin.  We will contemplate doing an angiogram of his right leg if we  have enough dye to give.  We have discussed the risks, benefits and options  of heart catheterization.  He stands and agrees to proceed.  All of his  other medical problems are stable.           ______________________________  Vesta Mixer, M.D.     PJN/MEDQ  D:  01/08/2006  T:  01/08/2006  Job:  [id number]   cc:   Gordy Savers, MD

## 2010-07-12 NOTE — Cardiovascular Report (Signed)
NAME:  [name], [name] NO.:  [id number]   MEDICAL RECORD NO.:  [id number]          PATIENT TYPE:  OIB   LOCATION:  NA                           FACILITY:  [facility]   PHYSICIAN:  Vesta Mixer, M.D. DATE OF BIRTH:  August 25, 1932   DATE OF PROCEDURE:  01/09/2006  DATE OF DISCHARGE:                              CARDIAC CATHETERIZATION   HISTORY OF PRESENT ILLNESS:  Johnny [name] is a 77 year old gentleman with  hypertension, hyperlipidemia and diabetes mellitus.  He recently had a  stress Cardiolite study for evaluation of ischemia.  Has a history of silent  ischemia.  The stress Cardiolite study revealed significant inferior wall  ischemia and he is referred for heart catheterization.   PROCEDURES:  Left heart catheterization with coronary angiography.   The right femoral artery was easily cannulated using modified Seldinger  technique.   HEMODYNAMIC RESULTS:  LV pressure is 116/2 with an aortic pressure of  115/57.   ANGIOGRAPHY:  Left main:  Left main has mild irregularities.   The left anterior descending artery is very heavily calcified.  There is an  ostial and proximal 50% hazy tubular stenosis.  There is a slight aneurysmal  area followed by another long 50% stenosis.  The vessel is quite tortuous  and calcified in this area.  The mid and distal LAD have minor luminal  irregularities.  The first diagonal artery has mild irregularities.   The left circumflex artery has a proximal 60-70% stenosis followed by 50%  stenosis.  The first obtuse marginal artery has mild irregularities.   The right coronary artery is large and dominant.  There is an ostial 99%  stenosis.  There was dampening of the of the catheter upon engagement.   The posterior descending artery has minor luminal irregularities.   The left ventriculogram was performed in the 30 RAO position.  It reveals  overall normal left ventricular systolic function.  There was no mitral   regurgitation.   COMPLICATIONS:  None.   CONCLUSION:  Three-vessel coronary artery disease.  His right coronary  artery is extremely tight.  He has moderate to severe disease in the LAD and  left circumflex artery.  The LAD is very heavily calcified and the proximal  and ostial LAD is not a good target for PTCA.  We will refer him to CVTS for  further evaluation.           ______________________________  Vesta Mixer, M.D.     PJN/MEDQ  D:  01/09/2006  T:  01/09/2006  Job:  [id number]   cc:   Gordy Savers, MD

## 2010-07-12 NOTE — Op Note (Signed)
Johnny [name], Johnny [name]              ACCOUNT NO.:  [id number]   MEDICAL RECORD NO.:  [id number]          PATIENT TYPE:  INP   LOCATION:  2307                         FACILITY:  [facility]   PHYSICIAN:  Evelene Croon, M.D.     DATE OF BIRTH:  [birth year]   DATE OF PROCEDURE:  01/14/2006  DATE OF DISCHARGE:                               OPERATIVE REPORT   PREOPERATIVE DIAGNOSIS:  Severe three-vessel coronary artery disease.   POSTOPERATIVE DIAGNOSIS:  Severe three-vessel coronary artery disease.   OPERATIVE PROCEDURE:  Median sternotomy, extracorporeal circulation,  coronary artery bypass graft surgery times three using a left internal  mammary artery graft to the left anterior descending coronary artery,  with a saphenous vein graft to the obtuse marginal branch of the left  circumflex coronary artery and a saphenous vein graft to the posterior  descending branch of the right coronary artery; endoscopic vein  harvesting from the right leg.   SURGEON:  Evelene Croon, MD.   ASSISTANT:  Stephanie Acre Dominick, PA-C.   ANESTHESIA:  General endotracheal.   CLINICAL HISTORY:  This patient is a 77 year old gentleman with a  history of hypertension, hyperlipidemia and diabetes, who reports a  recent history of aching pain in shoulders and elbows, as well as  decreased energy level.  He had had previous angioplasty and stenting of  his right coronary artery in December 2001.  He underwent a stress  Cardiolite exam, which showed inferior ischemia.  Cardiac  catheterization on 01/09/2006 by Dr. Elease Hashimoto showed severe 3-vessel  disease with a 99% ostial right coronary stenosis.  This was a large,  dominant vessel.  The left circumflex had 60 to 70% proximal stenosis.  The LAD was heavily calcified with an ostial and proximal 50% hazy,  tubular stenosis.  Left ventricular function was normal.  There was no  mitral regurgitation.  After review of the angiogram and examination of  the patient, it was felt  that coronary artery bypass graft surgery was  the best treatment to prevent further ischemia and infarction.  I  discussed the operative procedure with the patient and his wife,  including alternatives, benefits and risks, including but not limited to  bleeding, blood transfusion, infection, stroke, myocardial infarction,  graft failure and death.  He understood and agreed to proceed.   DESCRIPTION OF OPERATIVE PROCEDURE:  The patient was taken to the  operating room and placed on the table in supine position.  After  induction general endotracheal anesthesia, a Foley catheter was placed  in the bladder using sterile technique.  Then, the chest, abdomen and  both lower extremities were prepped and draped in the usual sterile  manner.  The chest was entered through a median sternotomy incision and  the pericardium opened in the midline.  Examination of the heart showed  good ventricular contractility.  The ascending aorta had no palpable  plaques in it.   Then, the left internal mammary artery was harvested from the chest wall  as a pedicle graft.  This was a medium-caliber vessel with excellent  blood flow through it.  At the same time,  a segment of greater saphenous  vein was harvested from the right leg using endoscopic vein harvest  technique.  This vein was medium size and good quality.   Then, the patient was heparinized, and when an adequate activated  clotting time was achieved, the distal ascending aorta was cannulated  using a 20-French aortic cannula for arterial inflow.  Venous outflow  was achieved using a 2-stage venous cannula through the right atrial  appendage.  An antegrade cardioplegia and neck cannula was inserted in  the aortic root.   The patient was placed on cardiopulmonary bypass and the distal  coronaries identified.  The LAD was a large, graftable vessel.  The  obtuse marginal branch was a large graftable vessel.  The right coronary  artery was diffusely  diseased.  There was a large posterior descending  branch and several small posterolateral branches.   Then, the aorta was crossclamped and 500 cc of cold blood antegrade  cardioplegia was administered into the aortic root with quick arrest of  the heart.  Systemic hypothermia to 28 degrees Centigrade and topical  hypothermia with ice saline was used.  A temperature probe was placed in  the septum and an insulated pad on the pericardium.   The first distal anastomosis was then performed to the obtuse marginal  branch.  The internal diameter was about 2 mm.  The conduit used was a  segment of greater saphenous vein, and the anastomosis performed in end-  to-side manner with continuous 7-0 Prolene suture.  Flow was measured  through the graft and was excellent.   A second distal anastomosis was performed of the posterior descending  coronary artery.  The internal diameter was also about 2 mm.  The  conduit used was the second segment of greater saphenous vein, and the  anastomosis performed in an end-to-side manner using continuous 7-0  Prolene suture.  Flow was measured through the graft and was excellent.   Then, the third distal anastomosis was performed to the midportion of  the left anterior descending coronary artery.  The internal diameter of  this vessel was about 1.75 mm.  The conduit used was the left internal  mammary graft, and it was brought through an opening in the left  pericardium anterior to the phrenic nerve.  It was anastomosed to the  LAD in an end-to-side manner using continuous 8-0 Prolene suture.  The  pedicle was sutured to the epicardium with 6-0 Prolene sutures.  The  patient was rewarmed to 37 degrees Centigrade.  With the crossclamp in  place, the 2 proximal vein graft anastomoses were performed to the  aortic root in end-to-side manner using continuous 6-0 Prolene suture. Then, the clamp was removed from the mammary pedicle.  There were rapid  warming of  the ventricular septum and return of spontaneous ventricular  fibrillation.  The crossclamp was removed with a time of 58 minutes, and  the patient spontaneously converted to sinus rhythm.   The proximal and distal anastomosis appeared hemostatic and allowed to  graft satisfactorily.  Graft markers were placed around the proximal  anastomoses.  Two temporary right ventricular and right atrial pacing  wires were placed and brought out through the skin.   When the patient had rewarmed to 37 degrees Centigrade, he was weaned  from cardiopulmonary bypass on no inotropic agents.  The total bypass  time was 60-70 minutes.  Cardiac function appeared excellent with a  cardiac output of 5 L per minute.  Protamine  was given and the venous  and aortic cannulae were removed without difficulty.  Hemostasis was  achieved.  Three chest tubes were placed with a tube in the posterior  pericardium, 1 in the left pleural space and 1 in the anterior  mediastinum.  The pericardium was loosely reapproximated over the heart.  The sternum was closed with #6 stainless steel wires.  The fascia was  closed with a continuous #1 Vicryl suture.  The subcutaneous tissue was  closed with continuous 2-0 Vicryl, and the skin with a 3-0 Vicryl  subcuticular closure.  The lower extremity vein harvest site was closed  in layers in a similar manner.  The sponge, needle and instrument  counts were correct as performed by the scrub nurse.  Dry sterile  dressings were applied over the incision and around the chest tubes,  which were hooked to Pleur-evac suction.  The patient remained  hemodynamically stable and was transported to the SICU in guarded, but  stable condition.      Evelene Croon, M.D.  Electronically Signed     BB/MEDQ  D:  01/14/2006  T:  01/15/2006  Job:  [id number]   cc:   Vesta Mixer, M.D.  Cardiac Cath Lab

## 2010-07-12 NOTE — Discharge Summary (Signed)
NAMECAMERIN, Johnny [name]              ACCOUNT NO.:  [id number]   MEDICAL RECORD NO.:  [id number]          PATIENT TYPE:  INP   LOCATION:  2021                         FACILITY:  [facility]   PHYSICIAN:  Evelene Croon, M.D.     DATE OF BIRTH:  [birth year]   DATE OF ADMISSION:  01/14/2006  DATE OF DISCHARGE:  01/17/2006                               DISCHARGE SUMMARY   PRIMARY DIAGNOSIS:  Severe three-vessel coronary artery disease.   SECONDARY DIAGNOSES:  1. Diabetes mellitus.  2. Hypertension.  3. Hyperlipidemia.  4. Status post vasectomy.  5. Status post tonsillectomy.  6. A history of coronary artery disease status post stenting of his      right coronary artery.   IN-HOSPITAL OPERATIONS AND PROCEDURES:  1. Coronary artery bypass grafting x3 using a left internal mammary      artery graft to left anterior descending coronary artery, saphenous      vein graft to obtuse marginal branch, saphenous vein graft to      posterior descending branch.  2. Endoscopic vein harvesting from the right leg.   THE PATIENT'S HISTORY AND PHYSICAL AND HOSPITAL COURSE:  The patient is  a 77 year old gentleman with a history of hypertension, hyperlipidemia,  and diabetes who reports a recent history of aching pain in shoulders as  well as decreased energy level.  He had a previous angioplasty and  stenting of his right coronary artery in December 2001.  The patient  underwent a stress Cardiolite exam which showed inferior ischemia.  He  was then taken to cardiac catheterization by Dr. Elease Hashimoto on January 09, 2006 which showed the circumflex had a 67% proximal stenosis.  The LAD  was heavily calcified within ostial and proximal was 50% AV tubular  stenosis.  Left ventricular function was normal.  There is no mitral  regurgitation.  Following catheterization, Dr. Laneta Simmers was consulted.  Dr. Laneta Simmers saw and evaluated the patient.  He discussed with the patient  undergoing coronary artery bypass grafting.   Risks and benefits were  discussed.  The patient acknowledges understanding and agreed to  proceed.  Surgery was scheduled for January 14, 2006.  For details of  patient's past medical history and physical exam, please see dictated  history and physical.   The patient was taken to the operating room on January 14, 2006 where  he underwent coronary bypass grafting x3 using a left internal mammary  artery graft to left anterior descending coronary artery, saphenous vein  graft to obtuse marginal, saphenous vein graft to posterior descending.  Endoscopic vein harvesting from right leg was done.  The patient  tolerated this procedure well and was transferred to the Intensive Care  Unit in stable condition.  Immediately following surgery, the patient  was seen to be hemodynamically stable.  He was extubated the evening of  surgery.  Following extubation, the patient seemed to be alert and  oriented x4.  Neuro intact.  The patient's postoperative course was  pretty much unremarkable.  Postoperative day one, the vital signs were  seen to be stable and the patient was weaned off  the drip.  Chest tubes  and lines were discontinued.  He was out of bed ambulating well.  The  patient was transferred out to 2000 postoperative day two.  During his  postoperative course, his vital signs were monitored and seen to be  stable.  He was able to be weaned off oxygen saturating greater than 90%  on room air.  He remained afebrile.  Blood sugars were monitored closely  due to the patient's history of diabetes mellitus.  He was restarted on  his home p.o. diabetic medications.  Blood sugars were seen to be  stable.  The patient's weight was stable near his preoperative weight.  No excessive edema noted.  The patient remained in normal sinus rhythm  postoperatively.  Postoperatively, her chest x-ray was stable.  Incision  was clean, dry, and intact and healing well.  The patient continued to  increase  ambulation and did well.  He was tolerating a regular diet  well.  No nausea or vomiting were noted.  Bowel movements within normal  limits prior to discharge home.   The patient was considered ready for discharge home in the next 1-2  days.   FOLLOW-UP APPOINTMENTS:  Follow-up appointment will be arranged with Dr.  Laneta Simmers.  Our office will contact the patient with this information.  The  patient will need to contact Dr. Harvie Bridge office to schedule follow-up  appointment with him in 2 weeks.  The patient will obtain a PA and  lateral chest x-ray at this appointment he will then bring with him to  Dr. Sharee Pimple appointment.   ACTIVITY:  The patient was instructed no driving until released to do  so, no lifting greater than 10 pounds.  He was told to ambulate 3-4  times per day, progress as tolerated, and continue his breathing  exercises.   INCISIONAL CARE:  The patient was told to shower, washing his incisions  using soap and water.  He is to contact the office if he develops any  drainage or opening from any of his incision sites.   DIET:  The patient was educated on diet to be low-fat, low-salt, as well  as carbohydrate-modified medium-calorie diet.   DISCHARGE MEDICATIONS:  1. Aspirin 325 mg daily.  2. Toprol XL 25 mg daily.  3. Lisinopril 20 mg daily.  4. Mevacor 20 mg b.i.d.  5. Glucophage 1000 mg b.i.d.  6. Glyburide 2.5 mg b.i.d.  7. Niacin 250 mg b.i.d.  8. Xalatan 1 drop both eyes at night.  9. Ultram 50 mg 1-2 tablets every 4-6 hours p.r.n. pain.      Theda Belfast, [place]      Evelene Croon, M.D.  Electronically Signed    KMD/MEDQ  D:  01/17/2006  T:  01/17/2006  Job:  [id number]   cc:   Vesta Mixer, M.D.

## 2010-07-12 NOTE — Cardiovascular Report (Signed)
[place]. [facility]  Patient:    Johnny [name], Johnny [name]                       MRN: [id number] Proc. Date: 02/14/00 Adm. Date:  [id number] Attending:  Koren Bound CC:         Gordy Savers, M.D.   Cardiac Catheterization  PROCEDURES: Left heart catheterization with coronary angiography with percutaneous transluminal coronary angioplasty and stenting of the right coronary artery.  DESCRIPTION OF PROCEDURE: The right femoral artery was easily cannulated using the modified Seldinger technique.  HEMODYNAMICS: The left ventricular pressure was 118/3 with an aortic pressure of 123/69.  ANGIOGRAPHY: The left main coronary artery has mild irregularities.  The left anterior descending artery has a proximal 40-50% stenosis.  This lesion is somewhat calcified and tubular.  It does not appear to obstruct blood flow.  There is a first septal branch which has a tight stenosis.  This branch is fairly small and is not a candidate for angioplasty.  The remainder of the LAD has minor luminal irregularities.  There are several small diagonal branches with mild to moderate irregularities.  The left circumflex artery is a moderate to large vessel.  It has a 50% stenosis in the proximal segment.  The obtuse marginal artery has mild irregularities between 20-25%.  The right coronary artery is large and dominant.  There is an ostial 50-60% stenosis.  The mid RCA has a 99% stenosis.  There is some mild to moderate irregularities just following this tight stenosis.  The remainder of the right coronary artery is unremarkable.  The posterior descending artery and the posterolateral segment artery are fairly normal.  LEFT VENTRICULOGRAM:  The left ventriculogram was performed in a 30 RAO position.  It reveals normal left ventricular systolic function with an EF of around 65-70%.  PERCUTANEOUS TRANSLUMINAL CORONARY ANGIOPLASTY: The 6 French catheter was traded out  for a 7 [place] catheter.  A total of 4300 units of heparin were given followed by a double bolus Integrilin drip.  The right coronary artery was engaged using a 7 [place] right four side-hole guide.  A short Patriot wire was used to wire the right coronary artery.  This was followed by a 4.0 x 15 mm Quantum Monorail.  This was positioned into the mid RCA.  It was inflated up to 4 atmospheres for 38 seconds followed by 10 atmospheres for 51 seconds.  There was significant improvement following this balloon inflation. At this point, a 4.0 x 18 mm NIR stent was placed across the mid lesion.  It was deployed at 12 atmospheres for 49 seconds.  This resulted in a smooth lumen with no significant stenoses within the stent.  There was still some mild irregularities just distal to the stent but these do not appear to be critical.  This balloon was removed and was replaced with a 4.0 x 15 mm Quantum.  We positioned the balloon in the ostium.  Two inflations were performed; 5 atmospheres for 57 seconds followed by 8 atmospheres for one minute.  This resulted in some slight improvement of the ostial lumen.  The stenosis was reduced from the 50-60% down to perhaps 30% range.  Because of the noncritical nature of this lesion, and due to some difficulty in keeping the balloon in position, we did not attempt to stent this lesion.  It does not appear to be critical and we can treat this lesion medically for now.  He is stable from the catheterization lab. DD:  02/14/00 TD:  02/15/00 Job: [id number]

## 2010-07-12 NOTE — Assessment & Plan Note (Signed)
[facility] OFFICE NOTE   Johnny [name], Johnny [name] RAJINDER MESICK                  MRN:          [id number]  DATE:04/09/2006                            DOB:          04-30-1932    A 77 year old gentleman seen today for an annual exam.  Last fall he  underwent CABG.  He has done quite well.  He has hypertension, type 2  diabetes, and dyslipidemia.  Blood sugars have been doing quite well.  Laboratory studies were reviewed.  Hemoglobin A1c was 7.2.  He feels  that more recently, however, this has been much better.  He did  temporarily increase his glyburide to 5 b.i.d. with some hypoglycemia.   FAMILY HISTORY:  Positive for cardiac and cerebrovascular disease.   EXAM:  Revealed an elderly gentleman in no acute distress.  Blood pressure was 140/60.  FUNDI, EARS, NOSE, AND THROAT:  Unremarkable.  NECK:  Revealed right carotid and supraclavicular bruit.  CHEST:  Clear.  CARDIOVASCULAR:  Normal heart sounds.  No murmurs.  Well-healed  sternotomy scar.  ABDOMEN:  Obese, soft, and nontender.  No organomegaly.  EXTERNAL GENITALIA:  Normal.  RECTAL:  Prostate +2 to 3 and benign.  Stool heme-negative.  EXTREMITIES:  Revealed the left dorsalis pedis pulse to be diminished.   IMPRESSION:  1. Coronary artery disease status post coronary artery bypass      grafting.  2. Diabetes type 2.  3. Hypertension.  4. Dyslipidemia.   DISPOSITION:  Medical regimen unchanged.  Will recheck a hemoglobin A1c  in 3 months.     Gordy Savers, MD  Electronically Signed    PFK/MedQ  DD: 04/09/2006  DT: 04/09/2006  Job #: [id number]

## 2010-07-15 ENCOUNTER — Other Ambulatory Visit: Payer: Self-pay

## 2010-07-15 NOTE — Telephone Encounter (Signed)
Refill request for lovastatin - adverse reaction per pt - denied

## 2010-09-17 ENCOUNTER — Ambulatory Visit (INDEPENDENT_AMBULATORY_CARE_PROVIDER_SITE_OTHER): Payer: Medicare Other | Admitting: Internal Medicine

## 2010-09-17 ENCOUNTER — Encounter: Payer: Self-pay | Admitting: Internal Medicine

## 2010-09-17 DIAGNOSIS — E785 Hyperlipidemia, unspecified: Secondary | ICD-10-CM

## 2010-09-17 DIAGNOSIS — I2510 Atherosclerotic heart disease of native coronary artery without angina pectoris: Secondary | ICD-10-CM

## 2010-09-17 DIAGNOSIS — E119 Type 2 diabetes mellitus without complications: Secondary | ICD-10-CM

## 2010-09-17 DIAGNOSIS — I10 Essential (primary) hypertension: Secondary | ICD-10-CM

## 2010-09-17 NOTE — Progress Notes (Signed)
Quick Note:  Spoke with pt - informed of lab and dr. Vernon Prey instructions - will pick up copy of lab and info on carbs tomorrow. KIK ______

## 2010-09-17 NOTE — Progress Notes (Signed)
  Subjective:    Patient ID: Johnny End., male    DOB: July 27, 1932, 77 y.o.   MRN: [id number]  HPI  Wt Readings from Last 3 Encounters:  09/17/10 171 lb (77.565 kg)  07/05/10 168 lb (76.204 kg)  06/18/10 169 lb (76.35 kg)    77 year old patient who is seen today for followup of his type 2 diabetes. He has done quite well. Was seen last visit for his annual exam. His hemoglobin A1c was 6.9. There's been some modest weight gain over the past few months. Denies any cardiopulmonary complaints. Review of Systems  Constitutional: Negative for fever, chills, appetite change and fatigue.  HENT: Negative for hearing loss, ear pain, congestion, sore throat, trouble swallowing, neck stiffness, dental problem, voice change and tinnitus.   Eyes: Negative for pain, discharge and visual disturbance.  Respiratory: Negative for cough, chest tightness, wheezing and stridor.   Cardiovascular: Negative for chest pain, palpitations and leg swelling.  Gastrointestinal: Negative for nausea, vomiting, abdominal pain, diarrhea, constipation, blood in stool and abdominal distention.  Genitourinary: Negative for urgency, hematuria, flank pain, discharge, difficulty urinating and genital sores.  Musculoskeletal: Negative for myalgias, back pain, joint swelling, arthralgias and gait problem.  Skin: Negative for rash.  Neurological: Negative for dizziness, syncope, speech difficulty, weakness, numbness and headaches.  Hematological: Negative for adenopathy. Does not bruise/bleed easily.  Psychiatric/Behavioral: Negative for behavioral problems and dysphoric mood. The patient is not nervous/anxious.        Objective:   Physical Exam  Constitutional: He is oriented to person, place, and time. He appears well-developed.  HENT:  Head: Normocephalic.  Right Ear: External ear normal.  Left Ear: External ear normal.  Eyes: Conjunctivae and EOM are normal.  Neck: Normal range of motion.  Cardiovascular: Normal  rate and normal heart sounds.   Pulmonary/Chest: Breath sounds normal.  Abdominal: Bowel sounds are normal.  Musculoskeletal: Normal range of motion. He exhibits no edema and no tenderness.  Neurological: He is alert and oriented to person, place, and time.  Psychiatric: He has a normal mood and affect. His behavior is normal.          Assessment & Plan:   Diabetes mellitus type 2. We'll check a hemoglobin A1c. Exercise weight loss all encouraged Hypertension stable Coronary artery disease stable Statin intolerance.   Recheck in 3 months

## 2010-09-17 NOTE — Patient Instructions (Signed)
It is important that you exercise regularly, at least 20 minutes 3 to 4 times per week.  If you develop chest pain or shortness of breath seek  medical attention.  You need to lose weight.  Consider a lower calorie diet and regular exercise.   Please check your hemoglobin A1c every 3 months   

## 2010-11-05 ENCOUNTER — Other Ambulatory Visit: Payer: Self-pay | Admitting: Internal Medicine

## 2010-12-05 ENCOUNTER — Ambulatory Visit (INDEPENDENT_AMBULATORY_CARE_PROVIDER_SITE_OTHER): Payer: Medicare Other | Admitting: Nurse Practitioner

## 2010-12-05 ENCOUNTER — Encounter: Payer: Self-pay | Admitting: Nurse Practitioner

## 2010-12-05 VITALS — BP 142/82 | HR 74 | Ht 69.0 in | Wt 170.8 lb

## 2010-12-05 DIAGNOSIS — R61 Generalized hyperhidrosis: Secondary | ICD-10-CM

## 2010-12-05 DIAGNOSIS — I2510 Atherosclerotic heart disease of native coronary artery without angina pectoris: Secondary | ICD-10-CM

## 2010-12-05 DIAGNOSIS — I10 Essential (primary) hypertension: Secondary | ICD-10-CM

## 2010-12-05 DIAGNOSIS — R6889 Other general symptoms and signs: Secondary | ICD-10-CM

## 2010-12-05 LAB — BASIC METABOLIC PANEL
BUN: 13 mg/dL (ref 6–23)
CO2: 22 mEq/L (ref 19–32)
Calcium: 9.2 mg/dL (ref 8.4–10.5)
Chloride: 100 mEq/L (ref 96–112)
Creatinine, Ser: 0.8 mg/dL (ref 0.4–1.5)
GFR: 95.63 mL/min (ref >60.00)
Glucose, Bld: 209 mg/dL — ABNORMAL HIGH (ref 70–99)
Potassium: 4.3 mEq/L (ref 3.5–5.1)
Sodium: 134 mEq/L — ABNORMAL LOW (ref 135–145)

## 2010-12-05 MED ORDER — CARVEDILOL 25 MG PO TABS: 25.0000 mg | ORAL_TABLET | Freq: Two times a day (BID) | ORAL | Status: DC

## 2010-12-05 NOTE — Assessment & Plan Note (Signed)
He is having some atypical symptoms. Will arrange for repeat stress testing. He has known silent ischemia. Not able to take statin. CABG dates back to 2007. He will keep his follow up with Dr. Elease Hashimoto for next Tuesday. Patient is agreeable to this plan and will call if any problems develop in the interim.

## 2010-12-05 NOTE — Patient Instructions (Signed)
We are going to arrange for a stress test.  I want you to check your blood pressure several times a week and bring in for Dr. Elease Hashimoto to review.  Increase your Coreg to 25 mg two times a day.   Keep your appointment with Dr. Elease Hashimoto.

## 2010-12-05 NOTE — Assessment & Plan Note (Signed)
Blood pressure still up a little. Coreg is increased to 25 mg BID. He is agreeable to monitoring at home. I have asked him to bring in a diary for review. Sounds like he has some lability in readings but has gotten lax with his blood pressure checks.

## 2010-12-05 NOTE — Progress Notes (Signed)
Johnny [name]. Date of Birth: 08-Oct-1932   History of Present Illness: Johnny [name] is seen today for a work in visit. He is seen for Dr. Elease Hashimoto. He called him earlier this morning because he was not feeling well. His wife notes that he got really pale and just looked bad. Blood pressure was over 200 systolic. No chest pain but he has never had chest pain. Blood sugar was 137. He has gotten lax with checking his blood pressure at home. His wife notes that he has been having these spells for the last several months but today was worse. He is only on Coreg for his heart/blood pressure. Last stress test was over one year ago. Not able to take any statin in any fashion. No other neuro symptoms. No palpitations. He is not short of breath.   Current Outpatient Prescriptions on File Prior to Visit  Medication Sig Dispense Refill  . ACCU-CHEK SOFTCLIX LANCETS lancets by Other route daily. Use as instructed       . aspirin 81 MG tablet Take 81 mg by mouth as directed.       . carvedilol (COREG) 12.5 MG tablet Take 12.5 mg by mouth 2 (two) times daily with a meal.        . Flaxseed, Linseed, POWD Take by mouth daily.        . Garlic 500 MG CAPS Take by mouth daily.        [name] glucose blood (ACCU-CHEK AVIVA) test strip 1 each by Other route daily. Use as instructed       . glyBURIDE (DIABETA) 5 MG tablet Take 2.5 mg by mouth 2 (two) times daily with a meal.        . Krill Oil 300 MG CAPS Take 1 capsule by mouth daily.        . Lancets Misc. (ACCU-CHEK MULTICLIX LANCET DEV) KIT USE DAILY  102 each  5  . metFORMIN (GLUCOPHAGE) 1000 MG tablet Take by mouth. 1 tablet qAM and 1/2 tablet qPM      . Multiple Vitamin (MULTIVITAMIN) tablet Take 1 tablet by mouth daily.        . Multiple Vitamins-Minerals (VISION FORMULA) TABS Take by mouth daily.        . nitroGLYCERIN (NITROSTAT) 0.4 MG SL tablet Place 0.4 mg under the tongue every 5 (five) minutes as needed.        . travoprost, benzalkonium, (TRAVATAN)  0.004 % ophthalmic solution 1 drop at bedtime. As directed         Allergies  Allergen Reactions  . Crestor (Rosuvastatin Calcium)     Muscle aches  . Lipitor (Atorvastatin Calcium)     Muscle aches  . Lisinopril     REACTION: rash ?  . Lovastatin     Muscle aches  . Pravastatin     Muscle aches  . Zocor (Simvastatin - High Dose)     Muscle aches    Past Medical History  Diagnosis Date  . CORONARY ARTERY DISEASE 07/17/2006    CABG x3   . DIABETES MELLITUS, TYPE II 07/17/2006  . HYPERLIPIDEMIA 07/17/2006  . HYPERTENSION 07/17/2006    Past Surgical History  Procedure Date  . Vasectomy   . Tonsillectomy   . Coronary artery bypass graft 01/17/2006    x3 using a left internal mammary artery graft to left anterior descending coronary artery, saphenous vein graft to obtuse marginal branch, saphenous vein graft to posterior descending branch / Endoscopic vein  harvesting from the right leg.  . Coronary angioplasty with stent placement 01/09/2006    Three-vessel CAD  His right coronary artery is extremely tight.  He has moderate to severe disease in the LAD & left circumflex artery.  The LAD is very heavily calcified and the proximal and ostial LAD is not a good target for PTCA.  We will refer him to CVTS for further evaluation  . Coronary angioplasty with stent placement 02/14/2000    EF - of around 65-70%./PTCA and stenting of his mid right  coronary artery (4.0 x 18 mm NIR stent to the mid RCA).  He was noted  to have a 50% ostial stenosis at the time of his heart catheterization     History  Smoking status  . Former Smoker  . Types: Cigarettes  . Quit date: 02/24/1965  Smokeless tobacco  . Never Used    History  Alcohol Use No    Family History  Problem Relation Age of Onset  . Heart attack Father   . Stroke Mother     Review of Systems: The review of systems is positive for significant cold intolerance. His wife notes that he aches a lot in a general fashion and is  worried about fibromyalgia.  All other systems were reviewed and are negative.  Physical Exam: BP 142/82  Pulse 74  Ht 5\' 9"  (1.753 m)  Wt 170 lb 12.8 oz (77.474 kg)  BMI 25.22 kg/m2 Patient is very pleasant and in no acute distress. Currently feels well. Skin is warm and dry. Color is normal.  HEENT is unremarkable. Normocephalic/atraumatic. PERRL. Sclera are nonicteric. Neck is supple. No masses. No JVD. Lungs are clear. Cardiac exam shows a regular rate and rhythm. Abdomen is soft. Extremities are without edema. Gait and ROM are intact. No gross neurologic deficits noted.    LABORATORY DATA: PENDING   Assessment / Plan:

## 2010-12-06 LAB — TSH: TSH: 2.46 u[IU]/mL (ref 0.35–5.50)

## 2010-12-09 ENCOUNTER — Ambulatory Visit (HOSPITAL_COMMUNITY): Payer: Medicare Other | Attending: Cardiovascular Disease | Admitting: Radiology

## 2010-12-09 DIAGNOSIS — I2510 Atherosclerotic heart disease of native coronary artery without angina pectoris: Secondary | ICD-10-CM

## 2010-12-09 DIAGNOSIS — I10 Essential (primary) hypertension: Secondary | ICD-10-CM

## 2010-12-09 DIAGNOSIS — I25810 Atherosclerosis of coronary artery bypass graft(s) without angina pectoris: Secondary | ICD-10-CM

## 2010-12-09 DIAGNOSIS — R61 Generalized hyperhidrosis: Secondary | ICD-10-CM

## 2010-12-09 MED ORDER — TECHNETIUM TC 99M TETROFOSMIN IV KIT
33.0000 | PACK | Freq: Once | INTRAVENOUS | Status: AC | PRN
  Administered 2010-12-09: 33 via INTRAVENOUS

## 2010-12-09 MED ORDER — TECHNETIUM TC 99M TETROFOSMIN IV KIT
11.0000 | PACK | Freq: Once | INTRAVENOUS | Status: AC | PRN
  Administered 2010-12-09: 11 via INTRAVENOUS

## 2010-12-09 NOTE — Progress Notes (Signed)
[facility] SITE 3 NUCLEAR MED [address] [place] [address] [phone]  Cardiology Nuclear Med Study  Johnny [name]. is a 77 y.o. male [id number] [birth year]   Nuclear Med Background Indication for Stress Test:  Evaluation for Ischemia and Graft Patency History: '09 CABG, '01Heart Catheterization, '09 Myocardial Perfusion Study and '01 Stents Cardiac Risk Factors: Family History - CAD, History of Smoking, Hypertension, Lipids and NIDDM  Symptoms:  Dizziness, Light-Headedness and Palpitations   Nuclear Pre-Procedure Caffeine/Decaff Intake:  None NPO After: 7:00pm   Lungs:  clear IV 0.9% NS with Angio Cath:  20g  IV Site: R Antecubital  IV Started by:  Stanton Kidney, EMT-P  Chest Size (in):  44 Cup Size: n/a  Height: 5\' 9"  (1.753 m)  Weight:  168 lb (76.204 kg)  BMI:  Body mass index is 24.81 kg/(m^2). Tech Comments:  CBG=ok, per patient.    Nuclear Med Study 1 or 2 day study: 1 day  Stress Test Type:  Stress  Reading MD: Olga Millers, MD  Order Authorizing Provider:  P.Nahser  Resting Radionuclide: Technetium 67m Tetrofosmin  Resting Radionuclide Dose: 11 mCi   Stress Radionuclide:  Technetium 35m Tetrofosmin  Stress Radionuclide Dose: 33 mCi           Stress Protocol Rest HR: 62 Stress HR: 139  Rest BP: 158/78 Stress BP: 203/86  Exercise Time (min): 7:46 METS: 9.70   Predicted Max HR: 144 bpm % Max HR: 96.53 bpm Rate Pressure Product: 21308   Dose of Adenosine (mg):  n/a Dose of Lexiscan: n/a mg  Dose of Atropine (mg): n/a Dose of Dobutamine: n/a mcg/kg/min (at max HR)  Stress Test Technologist: Milana Na, EMT-P  Nuclear Technologist:  Doyne Keel, CNMT     Rest Procedure:  Myocardial perfusion imaging was performed at rest 45 minutes following the intravenous administration of Technetium 97m Tetrofosmin. Rest ECG: NSR  Stress Procedure:  The patient exercised for 7:46.  The patient stopped due to fatigue and denied any  chest pain.  There were non specific ST-T wave changes, pvcs,vcuplets,and vtrigeminy.  Technetium 61m Tetrofosmin was injected at peak exercise and myocardial perfusion imaging was performed after a brief delay. Stress ECG: No significant ST segment change suggestive of ischemia.  QPS Raw Data Images:  Acquisition technically good; normal left ventricular size. Stress Images:  There is decreased uptake in the inferior wall. Rest Images:  There is decreased uptake in the inferior wall. Subtraction (SDS):  No evidence of ischemia. Transient Ischemic Dilatation (Normal <1.22):  .33 Lung/Heart Ratio (Normal <0.45):  .98  Quantitative Gated Spect Images QGS EDV:  n/a ml QGS ESV:  n/a ml QGS cine images:  Study not gated. QGS EF: Study not gated  Impression Exercise Capacity:  Fair exercise capacity. BP Response:  Hypertensive blood pressure response. Clinical Symptoms:  No chest pain. ECG Impression:  No significant ST segment change suggestive of ischemia. Comparison with Prior Nuclear Study: No images to compare  Overall Impression:  Probably normal stress nuclear study with inferior thinning but no ischemia; study not gated and therefore small prior inferior infarct cannot be excluded.   Olga Millers

## 2010-12-10 ENCOUNTER — Ambulatory Visit (INDEPENDENT_AMBULATORY_CARE_PROVIDER_SITE_OTHER): Payer: Medicare Other | Admitting: Cardiovascular Disease

## 2010-12-10 ENCOUNTER — Encounter: Payer: Self-pay | Admitting: Cardiovascular Disease

## 2010-12-10 VITALS — BP 160/80 | HR 66 | Ht 68.5 in | Wt 169.8 lb

## 2010-12-10 DIAGNOSIS — I10 Essential (primary) hypertension: Secondary | ICD-10-CM

## 2010-12-10 DIAGNOSIS — I2510 Atherosclerotic heart disease of native coronary artery without angina pectoris: Secondary | ICD-10-CM

## 2010-12-10 MED ORDER — LOSARTAN POTASSIUM 50 MG PO TABS: 50.0000 mg | ORAL_TABLET | Freq: Every day | ORAL | Status: DC

## 2010-12-10 NOTE — Assessment & Plan Note (Addendum)
His blood pressure remained slightly elevated today.    We will start  Losartan 50 mg a day.  He had a history of a  reaction to lisinopril although he took lisinopril for years before cause a rash. I do not think that he would necessarily have a reaction to losartan.

## 2010-12-10 NOTE — Patient Instructions (Signed)
Your physician recommends that you schedule a follow-up appointment in: 6 weeks with Dr. Elease Hashimoto. Your physician recommends that you return for lab work in: 6 weeks BMET with return visit. Your physician has recommended you make the following change in your medication: start Losartan 50 mg, take one tablet by mouth daily. Your Doctor recommends for you to increase exercise.

## 2010-12-10 NOTE — Progress Notes (Signed)
Johnny [name]. Date of Birth  10/26/1932 [facility] [address]    [address] [place], [place]  [address] [phone]  Fax  [phone]  History of Present Illness:  77 yo gentleman with a hx of CAD - s/p CABG, HTN,   He's had some recent episodes of dizziness. His blood pressure has also been elevated.  He felt poorly. He denies any chest pain or shortness breath.  He had lots of diaphoresis.  He called me last week complaining of not feeling well. It was difficult for him to describe exactly what his symptoms were but he did not describe any chest pain or shortness breath. He did have the sensation of flushing and also had profound diaphoresis. His blood pressure was noted to be elevated. He was seen by Lawson Fiscal here in the office. She increase his carvedilol and he presents today for further evaluation.  He's concerned about what happened last week but he's not had any recurrent episodes. He has his blood pressure still a little high. He appears to be a little anxious today.  Current Outpatient Prescriptions on File Prior to Visit  Medication Sig Dispense Refill  . ACCU-CHEK SOFTCLIX LANCETS lancets by Other route daily. Use as instructed       . aspirin 81 MG tablet Take 81 mg by mouth as directed.       . carvedilol (COREG) 25 MG tablet Take 1 tablet (25 mg total) by mouth 2 (two) times daily.  60 tablet  11  . Flaxseed, Linseed, POWD Take by mouth daily.        . Garlic 500 MG CAPS Take by mouth daily.        [name] glucose blood (ACCU-CHEK AVIVA) test strip 1 each by Other route daily. Use as instructed       . glyBURIDE (DIABETA) 5 MG tablet Take 2.5 mg by mouth 2 (two) times daily with a meal.        . Krill Oil 300 MG CAPS Take 1 capsule by mouth daily.        . Lancets Misc. (ACCU-CHEK MULTICLIX LANCET DEV) KIT USE DAILY  102 each  5  . metFORMIN (GLUCOPHAGE) 1000 MG tablet Take by mouth. 1 tablet qAM and 1/2 tablet qPM      . Multiple Vitamin (MULTIVITAMIN) tablet Take 1  tablet by mouth daily.        . Multiple Vitamins-Minerals (VISION FORMULA) TABS Take by mouth daily.        . nitroGLYCERIN (NITROSTAT) 0.4 MG SL tablet Place 0.4 mg under the tongue every 5 (five) minutes as needed.        . travoprost, benzalkonium, (TRAVATAN) 0.004 % ophthalmic solution 1 drop at bedtime. As directed        Current Facility-Administered Medications on File Prior to Visit  Medication Dose Route Frequency Provider Last Rate Last Dose  . technetium tetrofosmin (TC-MYOVIEW) injection 33 milli Curie  33 milli Curie Intravenous Once PRN Lewayne Bunting, MD   33 milli Curie at 12/09/10 1212    Allergies  Allergen Reactions  . Crestor (Rosuvastatin Calcium)     Muscle aches  . Lipitor (Atorvastatin Calcium)     Muscle aches  . Lisinopril     REACTION: rash ?  . Lovastatin     Muscle aches  . Pravastatin     Muscle aches  . Zocor (Simvastatin - High Dose)     Muscle aches    Past Medical  History  Diagnosis Date  . CORONARY ARTERY DISEASE Nov 2007    CABG x3   . DIABETES MELLITUS, TYPE II   . HYPERLIPIDEMIA     Not able to take statins.  [name] HYPERTENSION     Past Surgical History  Procedure Date  . Vasectomy   . Tonsillectomy   . Coronary artery bypass graft 01/17/2006    x3 using a left internal mammary artery graft to left anterior descending coronary artery, saphenous vein graft to obtuse marginal branch, saphenous vein graft to posterior descending branch / Endoscopic vein harvesting from the right leg.  . Coronary angioplasty with stent placement 01/09/2006    Three-vessel CAD  His right coronary artery is extremely tight.  He has moderate to severe disease in the LAD & left circumflex artery.  The LAD is very heavily calcified and the proximal and ostial LAD is not a good target for PTCA.  We will refer him to CVTS for further evaluation  . Coronary angioplasty with stent placement 02/14/2000    EF - of around 65-70%./PTCA and stenting of his mid right   coronary artery (4.0 x 18 mm NIR stent to the mid RCA).  He was noted  to have a 50% ostial stenosis at the time of his heart catheterization     History  Smoking status  . Former Smoker  . Types: Cigarettes  . Quit date: 02/24/1965  Smokeless tobacco  . Never Used    History  Alcohol Use No    Family History  Problem Relation Age of Onset  . Heart attack Father   . Stroke Mother     Reviw of Systems:  Reviewed in the HPI.  All other systems are negative.  Physical Exam: BP 192/88  Pulse 66  Ht 5' 8.5" (1.74 m)  Wt 169 lb 12.8 oz (77.021 kg)  BMI 25.44 kg/m2 BP 160 / 80 after a 10 minute delay.  The patient is alert and oriented x 3.  The mood and affect are normal.   Skin: warm and dry.  Color is normal.    HEENT:   the sclera are nonicteric.  The mucous membranes are moist.  The carotids are 2+ without bruits.  There is no thyromegaly.  There is no JVD.    Lungs: clear.  The chest wall is non tender.    Heart: regular rate with a normal S1 and S2.  There are no murmurs, gallops, or rubs. The PMI is not displaced.     Abdomen: good bowel sounds.  There is no guarding or rebound.  There is no hepatosplenomegaly or tenderness.  There are no masses.   Extremities:  no clubbing, cyanosis, or edema.  The legs are without rashes.  The distal pulses are intact.   Neuro:  Cranial nerves II - XII are intact.  Motor and sensory functions are intact.    The gait is normal.  ECG:  Assessment / Plan:

## 2010-12-10 NOTE — Assessment & Plan Note (Signed)
He has a history of coronary artery disease and coronary artery bypass grafting. None of his current symptoms sound ischemic. We performed a recent stress Myoview study which revealed good exercise capacity. He has no evidence of ischemia. His left ventricular systolic function is normal. We'll continue with the same medications. I've reassured him that his "spells" do not seem to be ischemic in origin.

## 2010-12-11 ENCOUNTER — Telehealth: Payer: Self-pay | Admitting: *Deleted

## 2010-12-11 NOTE — Telephone Encounter (Signed)
Message copied by Eugenia Pancoast on Wed Dec 11, 2010  9:52 AM ------      Message from: Rosalio Macadamia      Created: Fri Dec 06, 2010  8:53 AM       Ok to report. Labs are satisfactory. Will see what the stress test shows.

## 2010-12-11 NOTE — Telephone Encounter (Signed)
Advised patient of labs 

## 2010-12-12 ENCOUNTER — Encounter: Payer: Self-pay | Admitting: *Deleted

## 2010-12-17 ENCOUNTER — Ambulatory Visit (INDEPENDENT_AMBULATORY_CARE_PROVIDER_SITE_OTHER): Payer: Medicare Other | Admitting: Internal Medicine

## 2010-12-17 ENCOUNTER — Encounter: Payer: Self-pay | Admitting: Internal Medicine

## 2010-12-17 DIAGNOSIS — E785 Hyperlipidemia, unspecified: Secondary | ICD-10-CM

## 2010-12-17 DIAGNOSIS — E119 Type 2 diabetes mellitus without complications: Secondary | ICD-10-CM

## 2010-12-17 DIAGNOSIS — I10 Essential (primary) hypertension: Secondary | ICD-10-CM

## 2010-12-17 DIAGNOSIS — I2510 Atherosclerotic heart disease of native coronary artery without angina pectoris: Secondary | ICD-10-CM

## 2010-12-17 LAB — HEMOGLOBIN A1C: Hgb A1c MFr Bld: 6.9 % — ABNORMAL HIGH (ref 4.6–6.5)

## 2010-12-17 MED ORDER — GLIMEPIRIDE 4 MG PO TABS: ORAL_TABLET | ORAL | Status: DC

## 2010-12-17 MED ORDER — CARVEDILOL 25 MG PO TABS: ORAL_TABLET | ORAL | Status: DC

## 2010-12-17 NOTE — Progress Notes (Signed)
  Subjective:    Patient ID: Johnny End., male    DOB: 08/25/1932, 77 y.o.   MRN: [id number]  HPI  77 year old patient who is in today for followup. He's had a recent cardiology evaluation that included a negative nuclear medicine stress test. He presented with episodes of diaphoresis. There has been no documented hypoglycemia. His last hemoglobin A1c was greater than 7. Presently he is doing well without recurrence episodes of diaphoresis. He denies any active cardiopulmonary complaints. He's also had a recent eye examination. He has treated hypertension but has not done well on losartan or a higher dose of chloride. He does have a history of statin intolerance.    Review of Systems  Constitutional: Negative for fever, chills, appetite change and fatigue.  HENT: Negative for hearing loss, ear pain, congestion, sore throat, trouble swallowing, neck stiffness, dental problem, voice change and tinnitus.   Eyes: Negative for pain, discharge and visual disturbance.  Respiratory: Negative for cough, chest tightness, wheezing and stridor.   Cardiovascular: Negative for chest pain, palpitations and leg swelling.  Gastrointestinal: Negative for nausea, vomiting, abdominal pain, diarrhea, constipation, blood in stool and abdominal distention.  Genitourinary: Negative for urgency, hematuria, flank pain, discharge, difficulty urinating and genital sores.  Musculoskeletal: Negative for myalgias, back pain, joint swelling, arthralgias and gait problem.  Skin: Negative for rash.  Neurological: Negative for dizziness, syncope, speech difficulty, weakness, numbness and headaches.  Hematological: Negative for adenopathy. Does not bruise/bleed easily.  Psychiatric/Behavioral: Negative for behavioral problems and dysphoric mood. The patient is not nervous/anxious.        Objective:   Physical Exam  Constitutional: He is oriented to person, place, and time. He appears well-developed.       Blood  pressure 120/70  HENT:  Head: Normocephalic.  Right Ear: External ear normal.  Left Ear: External ear normal.  Eyes: Conjunctivae and EOM are normal.  Neck: Normal range of motion.  Cardiovascular: Normal rate and normal heart sounds.   Pulmonary/Chest: Breath sounds normal.  Abdominal: Bowel sounds are normal.  Musculoskeletal: Normal range of motion. He exhibits no edema and no tenderness.  Neurological: He is alert and oriented to person, place, and time.  Psychiatric: He has a normal mood and affect. His behavior is normal.          Assessment & Plan:   Diabetes mellitus. We'll discontinue glyburide and substitute Amaryl at a dose of 2 mg daily. We'll check a hemoglobin A1c Hypertension well controlled. We'll continue his present regimen Coronary artery disease stable status post normal recent neck or medicine stress test  Exercise encouraged Low-salt diet encouraged Recheck in 3 months or as needed

## 2010-12-17 NOTE — Patient Instructions (Signed)
Limit your sodium (Salt) intake   Please check your hemoglobin A1c every 3 months    It is important that you exercise regularly, at least 20 minutes 3 to 4 times per week.  If you develop chest pain or shortness of breath seek  medical attention.   

## 2011-01-23 ENCOUNTER — Other Ambulatory Visit: Payer: Medicare Other | Admitting: *Deleted

## 2011-01-23 ENCOUNTER — Ambulatory Visit: Payer: Medicare Other | Admitting: Cardiovascular Disease

## 2011-02-21 ENCOUNTER — Other Ambulatory Visit: Payer: Self-pay | Admitting: Internal Medicine

## 2011-03-19 ENCOUNTER — Ambulatory Visit (INDEPENDENT_AMBULATORY_CARE_PROVIDER_SITE_OTHER): Payer: Medicare Other | Admitting: Internal Medicine

## 2011-03-19 ENCOUNTER — Encounter: Payer: Self-pay | Admitting: Internal Medicine

## 2011-03-19 DIAGNOSIS — I10 Essential (primary) hypertension: Secondary | ICD-10-CM

## 2011-03-19 DIAGNOSIS — E119 Type 2 diabetes mellitus without complications: Secondary | ICD-10-CM

## 2011-03-19 DIAGNOSIS — I2510 Atherosclerotic heart disease of native coronary artery without angina pectoris: Secondary | ICD-10-CM

## 2011-03-19 DIAGNOSIS — E785 Hyperlipidemia, unspecified: Secondary | ICD-10-CM

## 2011-03-19 LAB — HEMOGLOBIN A1C: Hgb A1c MFr Bld: 6.7 % — ABNORMAL HIGH (ref 4.6–6.5)

## 2011-03-19 NOTE — Progress Notes (Signed)
Quick Note:  Mailed. ______

## 2011-03-19 NOTE — Patient Instructions (Signed)
Limit your sodium (Salt) intake  You need to lose weight.  Consider a lower calorie diet and regular exercise.   Please check your hemoglobin A1c every 3 months   

## 2011-03-19 NOTE — Progress Notes (Signed)
  Subjective:    Patient ID: Johnny [name]., male    DOB: [birth year], 78 y.o.   MRN: [id number]  HPI  78 year old patient who is seen today for followup of his type 2 diabetes. He is still well. His last hemoglobin A1c was less than 7. He has maintained a nice glycemic response. He has coronary artery disease and unfortunately a statin intolerance. This remains stable. He remains on daily aspirin. He was concerned about her recent news reports about aspirin toxicity involving the eyes He has hypertension which has been stable. Denies any cardiopulmonary complaints.     Review of Systems  Constitutional: Negative for fever, chills, appetite change and fatigue.  HENT: Negative for hearing loss, ear pain, congestion, sore throat, trouble swallowing, neck stiffness, dental problem, voice change and tinnitus.   Eyes: Negative for pain, discharge and visual disturbance.  Respiratory: Negative for cough, chest tightness, wheezing and stridor.   Cardiovascular: Negative for chest pain, palpitations and leg swelling.  Gastrointestinal: Negative for nausea, vomiting, abdominal pain, diarrhea, constipation, blood in stool and abdominal distention.  Genitourinary: Negative for urgency, hematuria, flank pain, discharge, difficulty urinating and genital sores.  Musculoskeletal: Negative for myalgias, back pain, joint swelling, arthralgias and gait problem.  Skin: Negative for rash.  Neurological: Negative for dizziness, syncope, speech difficulty, weakness, numbness and headaches.  Hematological: Negative for adenopathy. Does not bruise/bleed easily.  Psychiatric/Behavioral: Negative for behavioral problems and dysphoric mood. The patient is not nervous/anxious.        Objective:   Physical Exam  Constitutional: He is oriented to person, place, and time. He appears well-developed.  HENT:  Head: Normocephalic.  Right Ear: External ear normal.  Left Ear: External ear normal.  Eyes: Conjunctivae  and EOM are normal.  Neck: Normal range of motion.  Cardiovascular: Normal rate and normal heart sounds.   Pulmonary/Chest: Breath sounds normal.  Abdominal: Bowel sounds are normal.  Musculoskeletal: Normal range of motion. He exhibits no edema and no tenderness.  Neurological: He is alert and oriented to person, place, and time.  Psychiatric: He has a normal mood and affect. His behavior is normal.          Assessment & Plan:    Type 2 diabetes. Patient is stable Will recheck a hemoglobin A1c. Diet exercise weight loss are encouraged Coronary artery disease stable we'll continue present regimen Hypertension stable well controlled today  Recheck 3 months

## 2011-04-28 ENCOUNTER — Encounter: Payer: Self-pay | Admitting: Cardiovascular Disease

## 2011-04-28 ENCOUNTER — Ambulatory Visit (INDEPENDENT_AMBULATORY_CARE_PROVIDER_SITE_OTHER): Payer: Medicare Other | Admitting: Cardiovascular Disease

## 2011-04-28 DIAGNOSIS — I2510 Atherosclerotic heart disease of native coronary artery without angina pectoris: Secondary | ICD-10-CM

## 2011-04-28 DIAGNOSIS — I10 Essential (primary) hypertension: Secondary | ICD-10-CM

## 2011-04-28 DIAGNOSIS — E119 Type 2 diabetes mellitus without complications: Secondary | ICD-10-CM

## 2011-04-28 NOTE — Assessment & Plan Note (Signed)
[name] had some unusual sensations when I saw him in October. We performed a Myoview study which was normal. I do not think that any of his symptoms are related to coronary ischemia. I'll see him again in 6 months.

## 2011-04-28 NOTE — Patient Instructions (Signed)
Your physician wants you to follow-up in: 6 months  You will receive a reminder letter in the mail two months in advance. If you don't receive a letter, please call our office to schedule the follow-up appointment.  Your physician recommends that you return for a FASTING lipid profile: tomorrow

## 2011-04-28 NOTE — Progress Notes (Signed)
Johnny [name]. Date of Birth  1932/09/04 [facility] [address]    [address] [place], [place]  [address] [phone]  Fax  [phone]  Problem list: 1. Coronary artery disease-status post CABG in 2007 2. Diabetes mellitus 3. Hyperlipidemia 4. Hypertension  History of Present Illness:  78 yo gentleman with a hx of CAD - s/p CABG, HTN,   He's had some recent episodes of dizziness. His blood pressure has also been elevated.  He felt poorly. He denies any chest pain or shortness breath.  He had lots of diaphoresis.  He called me last week complaining of not feeling well. It was difficult for him to describe exactly what his symptoms were but he did not describe any chest pain or shortness breath. He did have the sensation of flushing and also had profound diaphoresis. His blood pressure was noted to be elevated. He was seen by Lawson Fiscal here in the office. She increase his carvedilol and he presents today for further evaluation.  He 's feeling somewhat fatigued today.  He complains of left arm and left leg numbness.  He was walking up until 4-5 weeks ago ( 50 minutes a day 5-6 days a week without problems)  His BP is up today.  He is anxious about various things.   Current Outpatient Prescriptions on File Prior to Visit  Medication Sig Dispense Refill  . ACCU-CHEK SOFTCLIX LANCETS lancets by Other route daily. Use as instructed       . aspirin 81 MG tablet Take 81 mg by mouth as directed.       . carvedilol (COREG) 25 MG tablet One half tablet twice daily  60 tablet  11  . Flaxseed, Linseed, POWD Take by mouth daily.        . Garlic 500 MG CAPS Take by mouth daily.        [name] glucose blood (ACCU-CHEK AVIVA) test strip 1 each by Other route daily. Use as instructed       . glyBURIDE (DIABETA) 5 MG tablet TAKE ONE-HALF TABLET BY MOUTH TWICE DAILY  90 tablet  3  . Krill Oil 300 MG CAPS Take 1 capsule by mouth daily.        . Lancets Misc. (ACCU-CHEK MULTICLIX LANCET DEV) KIT USE  DAILY  102 each  5  . metFORMIN (GLUCOPHAGE) 1000 MG tablet Take by mouth. 1 tablet qAM and 1/2 tablet qPM      . Multiple Vitamin (MULTIVITAMIN) tablet Take 1 tablet by mouth daily.        . Multiple Vitamins-Minerals (VISION FORMULA) TABS Take by mouth daily.        . nitroGLYCERIN (NITROSTAT) 0.4 MG SL tablet Place 0.4 mg under the tongue every 5 (five) minutes as needed.        . travoprost, benzalkonium, (TRAVATAN) 0.004 % ophthalmic solution 1 drop at bedtime. As directed         Allergies  Allergen Reactions  . Crestor (Rosuvastatin Calcium)     Muscle aches  . Lipitor (Atorvastatin Calcium)     Muscle aches  . Lisinopril     REACTION: rash ?  . Lovastatin     Muscle aches  . Pravastatin     Muscle aches  . Zocor (Simvastatin - High Dose)     Muscle aches    Past Medical History  Diagnosis Date  . CORONARY ARTERY DISEASE Nov 2007    CABG x3   . DIABETES MELLITUS, TYPE II   .  HYPERLIPIDEMIA     Not able to take statins.  [name] HYPERTENSION     Past Surgical History  Procedure Date  . Vasectomy   . Tonsillectomy   . Coronary artery bypass graft 01/17/2006    x3 using a left internal mammary artery graft to left anterior descending coronary artery, saphenous vein graft to obtuse marginal branch, saphenous vein graft to posterior descending branch / Endoscopic vein harvesting from the right leg.  . Coronary angioplasty with stent placement 01/09/2006    Three-vessel CAD  His right coronary artery is extremely tight.  He has moderate to severe disease in the LAD & left circumflex artery.  The LAD is very heavily calcified and the proximal and ostial LAD is not a good target for PTCA.  We will refer him to CVTS for further evaluation  . Coronary angioplasty with stent placement 02/14/2000    EF - of around 65-70%./PTCA and stenting of his mid right  coronary artery (4.0 x 18 mm NIR stent to the mid RCA).  He was noted  to have a 50% ostial stenosis at the time of his heart  catheterization     History  Smoking status  . Former Smoker  . Types: Cigarettes  . Quit date: 02/24/1965  Smokeless tobacco  . Never Used    History  Alcohol Use No    Family History  Problem Relation Age of Onset  . Heart attack Father   . Stroke Mother     Reviw of Systems:  Reviewed in the HPI.  All other systems are negative.  Physical Exam: BP 151/77  Pulse 68  Ht 5' 8.5" (1.74 m)  Wt 172 lb 12.8 oz (78.382 kg)  BMI 25.89 kg/m2 BP 160 / 80 after a 10 minute delay.  The patient is alert and oriented x 3.  The mood and affect are normal.   Skin: warm and dry.  Color is normal.    HEENT:   the sclera are nonicteric.  The mucous membranes are moist.  The carotids are 2+ without bruits.  There is no thyromegaly.  There is no JVD.    Lungs: clear.  The chest wall is non tender.    Heart: regular rate with a normal S1 and S2.  There are no murmurs, gallops, or rubs. The PMI is not displaced.     Abdomen: good bowel sounds.  There is no guarding or rebound.  There is no hepatosplenomegaly or tenderness.  There are no masses.   Extremities:  no clubbing, cyanosis, or edema.  The legs are without rashes.  The distal pulses are intact.   Neuro:  Cranial nerves II - XII are intact.  Motor and sensory functions are intact.    The gait is normal.  ECG: Normal sinus rhythm. He has premature atrial contractions. There are no changes from his previous tracing. Assessment / Plan:

## 2011-04-28 NOTE — Assessment & Plan Note (Signed)
[name]'s blood pressures up little bit today. He's anxious about some of the events of the past several months. I've asked him to continue with a good diet and exercise program. He was previously exercising regularly but has not been doing as much since the weather has been bad.

## 2011-04-29 ENCOUNTER — Other Ambulatory Visit (INDEPENDENT_AMBULATORY_CARE_PROVIDER_SITE_OTHER): Payer: Medicare Other

## 2011-04-29 DIAGNOSIS — I10 Essential (primary) hypertension: Secondary | ICD-10-CM

## 2011-04-29 LAB — BASIC METABOLIC PANEL
BUN: 11 mg/dL (ref 6–23)
Calcium: 9.1 mg/dL (ref 8.4–10.5)
GFR: 83.77 mL/min (ref >60.00)
Glucose, Bld: 92 mg/dL (ref 70–99)
Sodium: 132 mEq/L — ABNORMAL LOW (ref 135–145)

## 2011-05-01 ENCOUNTER — Telehealth: Payer: Self-pay | Admitting: Cardiovascular Disease

## 2011-05-01 NOTE — Telephone Encounter (Signed)
CALLED PT/ LABS MAILED

## 2011-05-01 NOTE — Telephone Encounter (Signed)
Pt got the information you left on answering machine but he has more questions and would like a copy of information mailed to him

## 2011-05-22 ENCOUNTER — Other Ambulatory Visit: Payer: Self-pay | Admitting: Internal Medicine

## 2011-05-31 ENCOUNTER — Other Ambulatory Visit: Payer: Self-pay | Admitting: Internal Medicine

## 2011-06-17 ENCOUNTER — Encounter: Payer: Self-pay | Admitting: Internal Medicine

## 2011-06-17 ENCOUNTER — Ambulatory Visit (INDEPENDENT_AMBULATORY_CARE_PROVIDER_SITE_OTHER): Payer: Medicare Other | Admitting: Internal Medicine

## 2011-06-17 VITALS — BP 116/72 | Temp 98.1°F | Wt 175.0 lb

## 2011-06-17 DIAGNOSIS — E785 Hyperlipidemia, unspecified: Secondary | ICD-10-CM

## 2011-06-17 DIAGNOSIS — E119 Type 2 diabetes mellitus without complications: Secondary | ICD-10-CM

## 2011-06-17 DIAGNOSIS — I2510 Atherosclerotic heart disease of native coronary artery without angina pectoris: Secondary | ICD-10-CM

## 2011-06-17 DIAGNOSIS — I10 Essential (primary) hypertension: Secondary | ICD-10-CM

## 2011-06-17 LAB — HEMOGLOBIN A1C: Hgb A1c MFr Bld: 6.8 % — ABNORMAL HIGH (ref 4.6–6.5)

## 2011-06-17 MED ORDER — METFORMIN HCL 1000 MG PO TABS: 1000.0000 mg | ORAL_TABLET | Freq: Two times a day (BID) | ORAL | Status: DC

## 2011-06-17 NOTE — Progress Notes (Signed)
  Subjective:    Patient ID: Johnny End., male    DOB: 1932/05/25, 78 y.o.   MRN: [id number]  HPI  78 year old patient who is in today for followup. He has a history of type 2 diabetes which has been well-controlled. He has treated hypertension and dyslipidemia. Unfortunately he is statin intolerant. He has coronary artery disease status post CABG which has been stable. He denies any cardiopulmonary complaints. Fasting blood sugar this morning 84. He does require medications refilled.    Review of Systems  Constitutional: Negative for fever, chills, appetite change and fatigue.  HENT: Negative for hearing loss, ear pain, congestion, sore throat, trouble swallowing, neck stiffness, dental problem, voice change and tinnitus.   Eyes: Negative for pain, discharge and visual disturbance.  Respiratory: Negative for cough, chest tightness, wheezing and stridor.   Cardiovascular: Negative for chest pain, palpitations and leg swelling.  Gastrointestinal: Negative for nausea, vomiting, abdominal pain, diarrhea, constipation, blood in stool and abdominal distention.  Genitourinary: Negative for urgency, hematuria, flank pain, discharge, difficulty urinating and genital sores.  Musculoskeletal: Negative for myalgias, back pain, joint swelling, arthralgias and gait problem.  Skin: Negative for rash.  Neurological: Negative for dizziness, syncope, speech difficulty, weakness, numbness and headaches.  Hematological: Negative for adenopathy. Does not bruise/bleed easily.  Psychiatric/Behavioral: Negative for behavioral problems and dysphoric mood. The patient is not nervous/anxious.        Objective:   Physical Exam  Constitutional: He is oriented to person, place, and time. He appears well-developed.  HENT:  Head: Normocephalic.  Right Ear: External ear normal.  Left Ear: External ear normal.  Eyes: Conjunctivae and EOM are normal.  Neck: Normal range of motion.  Cardiovascular: Normal rate  and normal heart sounds.   Pulmonary/Chest: Breath sounds normal.  Abdominal: Bowel sounds are normal.  Musculoskeletal: Normal range of motion. He exhibits no edema and no tenderness.  Neurological: He is alert and oriented to person, place, and time.       Diabetic foot exam performed intact to monofilament testing proprioception and vibratory sensation  Psychiatric: He has a normal mood and affect. His behavior is normal.          Assessment & Plan:   Coronary artery disease Dyslipidemia with statin intolerance Diabetes well controlled. We'll check a hemoglobin A1c Hypertension well controlled  Recheck 3 months

## 2011-06-17 NOTE — Patient Instructions (Signed)
Limit your sodium (Salt) intake    It is important that you exercise regularly, at least 20 minutes 3 to 4 times per week.  If you develop chest pain or shortness of breath seek  medical attention.   Please check your hemoglobin A1c every 3 months   

## 2011-06-18 ENCOUNTER — Other Ambulatory Visit: Payer: Self-pay

## 2011-06-18 MED ORDER — METFORMIN HCL 1000 MG PO TABS: ORAL_TABLET | ORAL | Status: DC

## 2011-09-16 ENCOUNTER — Encounter: Payer: Self-pay | Admitting: Internal Medicine

## 2011-09-16 ENCOUNTER — Ambulatory Visit (INDEPENDENT_AMBULATORY_CARE_PROVIDER_SITE_OTHER): Payer: Medicare Other | Admitting: Internal Medicine

## 2011-09-16 VITALS — BP 140/74

## 2011-09-16 DIAGNOSIS — Z0000 Encounter for general adult medical examination without abnormal findings: Secondary | ICD-10-CM

## 2011-09-16 DIAGNOSIS — E119 Type 2 diabetes mellitus without complications: Secondary | ICD-10-CM

## 2011-09-16 DIAGNOSIS — E785 Hyperlipidemia, unspecified: Secondary | ICD-10-CM

## 2011-09-16 DIAGNOSIS — I10 Essential (primary) hypertension: Secondary | ICD-10-CM

## 2011-09-16 DIAGNOSIS — I2510 Atherosclerotic heart disease of native coronary artery without angina pectoris: Secondary | ICD-10-CM

## 2011-09-16 LAB — MICROALBUMIN / CREATININE URINE RATIO
Microalb Creat Ratio: 21.9 mg/g (ref 0.0–30.0)
Microalb, Ur: 21.4 mg/dL — ABNORMAL HIGH (ref 0.0–1.9)

## 2011-09-16 LAB — LIPID PANEL
Cholesterol: 231 mg/dL — ABNORMAL HIGH (ref 0–200)
Total CHOL/HDL Ratio: 6

## 2011-09-16 LAB — HEMOGLOBIN A1C: Hgb A1c MFr Bld: 7.2 % — ABNORMAL HIGH (ref 4.6–6.5)

## 2011-09-16 LAB — LDL CHOLESTEROL, DIRECT: Direct LDL: 155.4 mg/dL

## 2011-09-16 MED ORDER — GLYBURIDE 5 MG PO TABS: 5.0000 mg | ORAL_TABLET | Freq: Every day | ORAL | Status: DC

## 2011-09-16 MED ORDER — METFORMIN HCL 1000 MG PO TABS: ORAL_TABLET | ORAL | Status: DC

## 2011-09-16 MED ORDER — CARVEDILOL 12.5 MG PO TABS: 12.5000 mg | ORAL_TABLET | Freq: Two times a day (BID) | ORAL | Status: DC

## 2011-09-16 NOTE — Progress Notes (Signed)
Patient ID: Johnny [name]., male   DOB: [birth year], 78 y.o.   MRN: [id number]  Subjective:    Patient ID: Johnny [name]., male    DOB: March 14, 1932, 78 y.o.   MRN: [id number]  Diabetes Pertinent negatives for hypoglycemia include no confusion, dizziness, headaches, nervousness/anxiousness, seizures, speech difficulty or tremors. Pertinent negatives for diabetes include no chest pain, no fatigue and no weakness.  78year-old patient who is seen today for an annual exam. He is followed by cardiology biannually for coronary artery disease. This has been stable. He has type 2 diabetes on dual therapy. No concerns or complaints today. He has dyslipidemia controlled on niacin.  1. Risk factors, based on past  M,S,F history-  patient has known coronary artery disease coronary vascular risk factors include diabetes dyslipidemia and hypertension  2.  Physical activities: Remains quite active physically he goes to a health club 3 times weekly 3.  Depression/mood: No history of depression or mood disorder  4.  Hearing: Mild hearing deficit  5.  ADL's: Independent in all aspects of daily living 6.  Fall risk: Low  7.  Home safety: No problems identified  8.  Height weight, and visual acuity; height and weight stable no change in visual acuity has a cataract extraction surgery on the left. Is followed by ophthalmology at least annually  9.  Counseling: Heart healthy diet regular exercise modest weight loss all encouraged low sodium diet encouraged 10. Lab orders based on risk factors:  11. Referral: Followup cardiology and ophthalmology as planned  12. Care plan: Heart healthy diet modest weight loss all encouraged cardiology followup encouraged  13. Cognitive assessment: Alert and oriented with normal affect. No cognitive dysfunction.       Review of Systems  Constitutional: Negative for fever, chills, activity change, appetite change and fatigue.  HENT: Negative for hearing loss, ear  pain, congestion, rhinorrhea, sneezing, mouth sores, trouble swallowing, neck pain, neck stiffness, dental problem, voice change, sinus pressure and tinnitus.   Eyes: Negative for photophobia, pain, redness and visual disturbance.  Respiratory: Negative for apnea, cough, choking, chest tightness, shortness of breath and wheezing.   Cardiovascular: Negative for chest pain, palpitations and leg swelling.  Gastrointestinal: Negative for nausea, vomiting, abdominal pain, diarrhea, constipation, blood in stool, abdominal distention, anal bleeding and rectal pain.  Genitourinary: Negative for dysuria, urgency, frequency, hematuria, flank pain, decreased urine volume, discharge, penile swelling, scrotal swelling, difficulty urinating, genital sores and testicular pain.  Musculoskeletal: Negative for myalgias, back pain, joint swelling, arthralgias and gait problem.  Skin: Negative for color change, rash and wound.  Neurological: Negative for dizziness, tremors, seizures, syncope, facial asymmetry, speech difficulty, weakness, light-headedness, numbness and headaches.  Hematological: Negative for adenopathy. Does not bruise/bleed easily.  Psychiatric/Behavioral: Negative for suicidal ideas, hallucinations, behavioral problems, confusion, disturbed wake/sleep cycle, self-injury, dysphoric mood, decreased concentration and agitation. The patient is not nervous/anxious.        Objective:   Physical Exam  Constitutional: He appears well-developed and well-nourished.  HENT:  Head: Normocephalic and atraumatic.  Right Ear: External ear normal.  Left Ear: External ear normal.  Nose: Nose normal.  Mouth/Throat: Oropharynx is clear and moist.  Eyes: Conjunctivae and EOM are normal. Pupils are equal, round, and reactive to light. No scleral icterus.  Neck: Normal range of motion. Neck supple. No JVD present. No thyromegaly present.  Cardiovascular: Normal rate, regular rhythm and normal heart sounds.  Exam  reveals no gallop and no friction rub.   No  murmur heard.      Sternotomy scar right dorsalis pedis pulse only palpable  Pulmonary/Chest: Effort normal and breath sounds normal. He exhibits no tenderness.  Abdominal: Soft. Bowel sounds are normal. He exhibits no distension and no mass. There is no tenderness.  Genitourinary: Prostate normal and penis normal.  Musculoskeletal: Normal range of motion. He exhibits no edema and no tenderness.  Lymphadenopathy:    He has no cervical adenopathy.  Neurological: He is alert. He has normal reflexes. No cranial nerve deficit. Coordination normal.  Skin: Skin is warm and dry. No rash noted.       Onychomycotic nails  Psychiatric: He has a normal mood and affect. His behavior is normal.          Assessment & Plan:   Unremarkable annual exam Coronary artery disease. Clinically stable. Type 2 diabetes. We'll check a hemoglobin A1c Dyslipidemia. We'll check a lipid profile  Lifestyle issues addressed. We'll recheck in 3 months

## 2011-09-16 NOTE — Patient Instructions (Signed)
Limit your sodium (Salt) intake    It is important that you exercise regularly, at least 20 minutes 3 to 4 times per week.  If you develop chest pain or shortness of breath seek  medical attention.  Please check your blood pressure on a regular basis.  If it is consistently greater than 150/90, please make an office appointment.   Please check your hemoglobin A1c every 3 months   

## 2011-10-28 ENCOUNTER — Encounter: Payer: Self-pay | Admitting: Cardiovascular Disease

## 2011-10-28 ENCOUNTER — Ambulatory Visit (INDEPENDENT_AMBULATORY_CARE_PROVIDER_SITE_OTHER): Payer: Medicare Other | Admitting: Cardiovascular Disease

## 2011-10-28 VITALS — BP 126/74 | HR 63 | Ht 68.5 in | Wt 172.0 lb

## 2011-10-28 DIAGNOSIS — I2510 Atherosclerotic heart disease of native coronary artery without angina pectoris: Secondary | ICD-10-CM

## 2011-10-28 DIAGNOSIS — E785 Hyperlipidemia, unspecified: Secondary | ICD-10-CM

## 2011-10-28 LAB — BASIC METABOLIC PANEL
BUN: 11 mg/dL (ref 6–23)
Creatinine, Ser: 0.9 mg/dL (ref 0.4–1.5)
GFR: 85.79 mL/min (ref >60.00)
Potassium: 4.5 mEq/L (ref 3.5–5.1)

## 2011-10-28 LAB — LDL CHOLESTEROL, DIRECT: Direct LDL: 146.3 mg/dL

## 2011-10-28 LAB — HEPATIC FUNCTION PANEL
Bilirubin, Direct: 0.1 mg/dL (ref 0.0–0.3)
Total Bilirubin: 0.8 mg/dL (ref 0.3–1.2)

## 2011-10-28 LAB — LIPID PANEL: Cholesterol: 219 mg/dL — ABNORMAL HIGH (ref 0–200)

## 2011-10-28 NOTE — Patient Instructions (Addendum)
Your physician recommends that you return for a FASTING lipid profile: today and in 6 months   Your physician wants you to follow-up in: 6 months  You will receive a reminder letter in the mail two months in advance. If you don't receive a letter, please call our office to schedule the follow-up appointment.   

## 2011-10-28 NOTE — Assessment & Plan Note (Signed)
We will check lipids today and again in 6 months when I see him again.

## 2011-10-28 NOTE — Assessment & Plan Note (Signed)
Johnny [name] is doing well.  He has no symptoms.  He has had silent ischemia in the past.  He recently had some dyspnea and dizziness.  A subsequent myoview was negative.  Continue current meds

## 2011-10-28 NOTE — Progress Notes (Signed)
Johnny [name]. Date of Birth  16-Oct-1932 [facility] [address]    [address] [place], [place]  [address] [phone]  Fax  [phone]  Problem list: 1. Coronary artery disease-status post CABG in 2007 2. Diabetes mellitus 3. Hyperlipidemia 4. Hypertension  History of Present Illness:  78 yo gentleman with a hx of CAD - s/p CABG, HTN,   He's had some recent episodes of dizziness. His blood pressure has also been elevated.  He felt poorly. He denies any chest pain or shortness breath.  He had lots of diaphoresis.  He called me last week complaining of not feeling well. It was difficult for him to describe exactly what his symptoms were but he did not describe any chest pain or shortness breath. He did have the sensation of flushing and also had profound diaphoresis. His blood pressure was noted to be elevated. He was seen by Lawson Fiscal here in the office. She increase his carvedilol and he presents today for further evaluation.  He 's feeling somewhat fatigued today.  He complains of left arm and left leg numbness.  He was walking up until 4-5 weeks ago ( 50 minutes a day 5-6 days a week without problems)  He has restarted his Aspirin and his coreg.  He has not had any angina.     Current Outpatient Prescriptions on File Prior to Visit  Medication Sig Dispense Refill  . ACCU-CHEK AVIVA PLUS test strip USE ONE EVERY DAY  100 each  4  . ACCU-CHEK SOFTCLIX LANCETS lancets by Other route daily. Use as instructed       . aspirin 81 MG tablet Take 81 mg by mouth as directed.       . Flaxseed, Linseed, POWD Take by mouth daily.        . Garlic 500 MG CAPS Take by mouth daily.        Boris Lown Oil 300 MG CAPS Take 1 capsule by mouth daily.        . Lancets Misc. (ACCU-CHEK MULTICLIX LANCET DEV) KIT USE DAILY  102 each  5  . metFORMIN (GLUCOPHAGE) 1000 MG tablet 1 tablet qAM and 1/2 tablet qPM  180 tablet  3  . Multiple Vitamin (MULTIVITAMIN) tablet Take 1 tablet by mouth daily.         . Multiple Vitamins-Minerals (VISION FORMULA) TABS Take by mouth daily.        . nitroGLYCERIN (NITROSTAT) 0.4 MG SL tablet Place 0.4 mg under the tongue every 5 (five) minutes as needed.        . travoprost, benzalkonium, (TRAVATAN) 0.004 % ophthalmic solution 1 drop at bedtime. As directed       . DISCONTD: carvedilol (COREG) 12.5 MG tablet Take 1 tablet (12.5 mg total) by mouth 2 (two) times daily with a meal.  180 tablet  6    Allergies  Allergen Reactions  . Cozaar   . Crestor (Rosuvastatin Calcium)     Muscle aches  . Lipitor (Atorvastatin Calcium)     Muscle aches  . Lisinopril     REACTION: rash ?  . Lovastatin     Muscle aches  . Pravastatin     Muscle aches  . Zocor (Simvastatin - High Dose)     Muscle aches    Past Medical History  Diagnosis Date  . CORONARY ARTERY DISEASE Nov 2007    CABG x3   . DIABETES MELLITUS, TYPE II   . HYPERLIPIDEMIA  Not able to take statins.  [name] HYPERTENSION     Past Surgical History  Procedure Date  . Vasectomy   . Tonsillectomy   . Coronary artery bypass graft 01/17/2006    x3 using a left internal mammary artery graft to left anterior descending coronary artery, saphenous vein graft to obtuse marginal branch, saphenous vein graft to posterior descending branch / Endoscopic vein harvesting from the right leg.  . Coronary angioplasty with stent placement 01/09/2006    Three-vessel CAD  His right coronary artery is extremely tight.  He has moderate to severe disease in the LAD & left circumflex artery.  The LAD is very heavily calcified and the proximal and ostial LAD is not a good target for PTCA.  We will refer him to CVTS for further evaluation  . Coronary angioplasty with stent placement 02/14/2000    EF - of around 65-70%./PTCA and stenting of his mid right  coronary artery (4.0 x 18 mm NIR stent to the mid RCA).  He was noted  to have a 50% ostial stenosis at the time of his heart catheterization     History  Smoking  status  . Former Smoker  . Types: Cigarettes  . Quit date: 02/24/1965  Smokeless tobacco  . Never Used    History  Alcohol Use No    Family History  Problem Relation Age of Onset  . Heart attack Father   . Stroke Mother     Reviw of Systems:  Reviewed in the HPI.  All other systems are negative.  Physical Exam: BP 126/74  Pulse 63  Ht 5' 8.5" (1.74 m)  Wt 172 lb (78.019 kg)  BMI 25.77 kg/m2  SpO2 94% BP 160 / 80 after a 10 minute delay.  The patient is alert and oriented x 3.  The mood and affect are normal.   Skin: warm and dry.  Color is normal.    HEENT:   the sclera are nonicteric.  The mucous membranes are moist.  The carotids are 2+ without bruits.  There is no thyromegaly.  There is no JVD.    Lungs: clear.  The chest wall is non tender.    Heart: regular rate with a normal S1 and S2.  There are no murmurs, gallops, or rubs. The PMI is not displaced.     Abdomen: good bowel sounds.  There is no guarding or rebound.  There is no hepatosplenomegaly or tenderness.  There are no masses.   Extremities:  no clubbing, cyanosis, or edema.  The legs are without rashes.  The distal pulses are intact.   Neuro:  Cranial nerves II - XII are intact.  Motor and sensory functions are intact.    The gait is normal.  ECG: Normal sinus rhythm. He has premature atrial contractions. There are no changes from his previous tracing. Assessment / Plan:

## 2011-11-03 ENCOUNTER — Telehealth: Payer: Self-pay | Admitting: Cardiovascular Disease

## 2011-11-03 NOTE — Telephone Encounter (Signed)
Pt would like his lab results that was done about a week and half ago

## 2011-11-03 NOTE — Telephone Encounter (Signed)
Lab results given

## 2011-12-02 ENCOUNTER — Other Ambulatory Visit: Payer: Self-pay | Admitting: Internal Medicine

## 2011-12-17 ENCOUNTER — Encounter: Payer: Self-pay | Admitting: Internal Medicine

## 2011-12-17 ENCOUNTER — Ambulatory Visit (INDEPENDENT_AMBULATORY_CARE_PROVIDER_SITE_OTHER): Payer: Medicare Other | Admitting: Internal Medicine

## 2011-12-17 VITALS — BP 170/80 | Temp 98.1°F | Wt 180.0 lb

## 2011-12-17 DIAGNOSIS — I2510 Atherosclerotic heart disease of native coronary artery without angina pectoris: Secondary | ICD-10-CM

## 2011-12-17 DIAGNOSIS — E119 Type 2 diabetes mellitus without complications: Secondary | ICD-10-CM

## 2011-12-17 DIAGNOSIS — E785 Hyperlipidemia, unspecified: Secondary | ICD-10-CM

## 2011-12-17 DIAGNOSIS — I10 Essential (primary) hypertension: Secondary | ICD-10-CM

## 2011-12-17 LAB — MICROALBUMIN / CREATININE URINE RATIO
Creatinine,U: 92.7 mg/dL
Microalb Creat Ratio: 19 mg/g (ref 0.0–30.0)
Microalb, Ur: 17.6 mg/dL — ABNORMAL HIGH (ref 0.0–1.9)

## 2011-12-17 LAB — HEMOGLOBIN A1C: Hgb A1c MFr Bld: 7.2 % — ABNORMAL HIGH (ref 4.6–6.5)

## 2011-12-17 NOTE — Progress Notes (Signed)
Subjective:    Patient ID: Johnny End., male    DOB: 1932/04/05, 78 y.o.   MRN: [id number]  HPI   77 year old patient who is seen for his quarterly evaluation of diabetes. He's had a fairly recent cardiology evaluation. He has CAD dyslipidemia and treated hypertension.  Sedentary but  denies any exercise associated symptoms.  Last hemoglobin A1c 7.2  Past Medical History  Diagnosis Date  . CORONARY ARTERY DISEASE Nov 2007    CABG x3   . DIABETES MELLITUS, TYPE II   . HYPERLIPIDEMIA     Not able to take statins.  [name] HYPERTENSION     History   Social History  . Marital Status: Married    Spouse Name: N/A    Number of Children: N/A  . Years of Education: N/A   Occupational History  . Not on file.   Social History Main Topics  . Smoking status: Former Smoker    Types: Cigarettes    Quit date: 02/24/1965  . Smokeless tobacco: Never Used  . Alcohol Use: No  . Drug Use: No  . Sexually Active: Not on file   Other Topics Concern  . Not on file   Social History Narrative  . No narrative on file    Past Surgical History  Procedure Date  . Vasectomy   . Tonsillectomy   . Coronary artery bypass graft 01/17/2006    x3 using a left internal mammary artery graft to left anterior descending coronary artery, saphenous vein graft to obtuse marginal branch, saphenous vein graft to posterior descending branch / Endoscopic vein harvesting from the right leg.  . Coronary angioplasty with stent placement 01/09/2006    Three-vessel CAD  His right coronary artery is extremely tight.  He has moderate to severe disease in the LAD & left circumflex artery.  The LAD is very heavily calcified and the proximal and ostial LAD is not a good target for PTCA.  We will refer him to CVTS for further evaluation  . Coronary angioplasty with stent placement 02/14/2000    EF - of around 65-70%./PTCA and stenting of his mid right  coronary artery (4.0 x 18 mm NIR stent to the mid RCA).  He was noted   to have a 50% ostial stenosis at the time of his heart catheterization     Family History  Problem Relation Age of Onset  . Heart attack Father   . Stroke Mother     Allergies  Allergen Reactions  . Cozaar   . Crestor (Rosuvastatin Calcium)     Muscle aches  . Lipitor (Atorvastatin Calcium)     Muscle aches  . Lisinopril     REACTION: rash ?  . Lovastatin     Muscle aches  . Pravastatin     Muscle aches  . Zocor (Simvastatin - High Dose)     Muscle aches    Current Outpatient Prescriptions on File Prior to Visit  Medication Sig Dispense Refill  . ACCU-CHEK AVIVA PLUS test strip USE ONE EVERY DAY  100 each  4  . aspirin 81 MG tablet Take 81 mg by mouth as directed.       . carvedilol (COREG) 12.5 MG tablet Take 12.5 mg by mouth. Taking 1 tablet in AM and 0.5 Tablet in PM      . Flaxseed, Linseed, POWD Take by mouth daily.        . Garlic 500 MG CAPS Take by mouth daily.        [name]  glyBURIDE (DIABETA) 5 MG tablet Take 5 mg by mouth. Taking 0.5 Tablet BID      . Krill Oil 300 MG CAPS Take 1 capsule by mouth daily.        . Lancets (ACCU-CHEK MULTICLIX) lancets USE DAILY  102 each  5  . Lancets Misc. (ACCU-CHEK MULTICLIX LANCET DEV) KIT USE DAILY  102 each  5  . metFORMIN (GLUCOPHAGE) 1000 MG tablet 1 tablet qAM and 1/2 tablet qPM  180 tablet  3  . Multiple Vitamin (MULTIVITAMIN) tablet Take 1 tablet by mouth daily.        . Multiple Vitamins-Minerals (VISION FORMULA) TABS Take by mouth daily.        . nitroGLYCERIN (NITROSTAT) 0.4 MG SL tablet Place 0.4 mg under the tongue every 5 (five) minutes as needed.        . travoprost, benzalkonium, (TRAVATAN) 0.004 % ophthalmic solution 1 drop at bedtime. As directed         BP 170/80  Temp 98.1 F (36.7 C) (Oral)  Wt 180 lb (81.647 kg)       Review of Systems  Constitutional: Negative for fever, chills, appetite change and fatigue.  HENT: Negative for hearing loss, ear pain, congestion, sore throat, trouble swallowing,  neck stiffness, dental problem, voice change and tinnitus.   Eyes: Negative for pain, discharge and visual disturbance.  Respiratory: Negative for cough, chest tightness, wheezing and stridor.   Cardiovascular: Negative for chest pain, palpitations and leg swelling.  Gastrointestinal: Negative for nausea, vomiting, abdominal pain, diarrhea, constipation, blood in stool and abdominal distention.  Genitourinary: Negative for urgency, hematuria, flank pain, discharge, difficulty urinating and genital sores.  Musculoskeletal: Negative for myalgias, back pain, joint swelling, arthralgias and gait problem.  Skin: Negative for rash.  Neurological: Negative for dizziness, syncope, speech difficulty, weakness, numbness and headaches.  Hematological: Negative for adenopathy. Does not bruise/bleed easily.  Psychiatric/Behavioral: Negative for behavioral problems and dysphoric mood. The patient is not nervous/anxious.        Objective:   Physical Exam  Constitutional: He is oriented to person, place, and time. He appears well-developed.  HENT:  Head: Normocephalic.  Right Ear: External ear normal.  Left Ear: External ear normal.  Eyes: Conjunctivae normal and EOM are normal.  Neck: Normal range of motion.  Cardiovascular: Normal rate and normal heart sounds.        Pedal pulses not easily palpable but feet warm and well perfused  Pulmonary/Chest: Breath sounds normal.  Abdominal: Bowel sounds are normal.  Musculoskeletal: Normal range of motion. He exhibits no edema and no tenderness.  Neurological: He is alert and oriented to person, place, and time.       Sensory exam of the feet revealed to be intact to vibratory sensation and monofilament testing  Psychiatric: He has a normal mood and affect. His behavior is normal.          Assessment & Plan:   Coronary artery disease stable Diabetes mellitus we'll check a hemoglobin A1c and urine for microalbumin Hypertension Dyslipidemia/statin  intolerance

## 2011-12-17 NOTE — Patient Instructions (Signed)
Limit your sodium (Salt) intake    It is important that you exercise regularly, at least 20 minutes 3 to 4 times per week.  If you develop chest pain or shortness of breath seek  medical attention.   Please check your hemoglobin A1c every 3 months   

## 2012-02-02 ENCOUNTER — Telehealth: Payer: Self-pay | Admitting: Internal Medicine

## 2012-02-02 NOTE — Telephone Encounter (Signed)
Noted for possible prior auth. Waiting for pt to bring in forms. Please give forms to [place]. Thanks.

## 2012-02-02 NOTE — Telephone Encounter (Signed)
Patient received letter from his pharmacy insurer notifying him that his Rx glyburide 5mg  is not covered for 2014, as the letter states that "the [employer] Academy [employer] Ger[employer] do not recommend this medication for the Medicare population."   States he has been on the two medications recommended in the letter in the past, but was taken off those by Dr. Amador Cunas.  [facility]; patient will drop the letter, with it accompanying fax authorization form, to the office.  Info to office.  krs/can

## 2012-02-03 NOTE — Telephone Encounter (Signed)
Okay to change to glipizide extended release 5 mg #90 one daily refill x4

## 2012-02-03 NOTE — Telephone Encounter (Signed)
Dr. Kirtland Bouchard - patient's insurance states they will no longer cover GLYBURIDE beginning 1/1/204. The two suggested alternatives are: GLIPIZIDE & GLIMEPIRIDE. Pt has already tried the latter of the two early 2013 but cannot remember why you changed him to GLYBURIDE. Please advise as to changing med, if appropriate. If patient cannot take either, let me know. I'll see what I can do w/insurance company.  Lupita Leash - pt uses [facility]. He would like a call back about what med he will be on.

## 2012-02-04 MED ORDER — GLIPIZIDE ER 5 MG PO TB24: 5.0000 mg | ORAL_TABLET | Freq: Every day | ORAL | Status: DC

## 2012-02-04 NOTE — Telephone Encounter (Signed)
Lupita Leash, can you change medication per Dr Kirtland Bouchard and inform the pt? Thank you.

## 2012-02-04 NOTE — Telephone Encounter (Signed)
Spoke to pt's wife told her new Rx for Glipizide ER was sent to pharmacy. Johnny [name]  Verbalized understanding.

## 2012-03-01 ENCOUNTER — Telehealth: Payer: Self-pay | Admitting: Cardiovascular Disease

## 2012-03-01 NOTE — Telephone Encounter (Signed)
New Problem:    Patient called in needing a surgical clearance for [facility] for an upcoming tendon reconstruction surgery sent in.  Please call back.

## 2012-03-01 NOTE — Telephone Encounter (Signed)
Told pt there should be no problem with clearance and we will wait to hear from ortho, pt aware.

## 2012-03-03 ENCOUNTER — Ambulatory Visit (INDEPENDENT_AMBULATORY_CARE_PROVIDER_SITE_OTHER): Payer: Medicare Other | Admitting: Internal Medicine

## 2012-03-03 ENCOUNTER — Encounter: Payer: Self-pay | Admitting: Internal Medicine

## 2012-03-03 VITALS — BP 156/80 | HR 84 | Temp 97.9°F | Resp 20 | Wt 180.0 lb

## 2012-03-03 DIAGNOSIS — I10 Essential (primary) hypertension: Secondary | ICD-10-CM

## 2012-03-03 DIAGNOSIS — E785 Hyperlipidemia, unspecified: Secondary | ICD-10-CM

## 2012-03-03 DIAGNOSIS — E119 Type 2 diabetes mellitus without complications: Secondary | ICD-10-CM

## 2012-03-03 NOTE — Progress Notes (Signed)
Subjective:    Patient ID: Johnny End., male    DOB: [birth year], 79 y.o.   MRN: [id number]  HPI  79 year old patient who has a history of coronary artery disease diabetes hypertension and dyslipidemia. He is doing quite well; he now is on glipizide which has been substituted for DiaBeta. He feels that his glycemic control is not quite as good. He has been followed closely by orthopedics do to a ruptured tendon involving his right foot. He is contemplating surgery for repair  Past Medical History  Diagnosis Date  . CORONARY ARTERY DISEASE Nov 2007    CABG x3   . DIABETES MELLITUS, TYPE II   . HYPERLIPIDEMIA     Not able to take statins.  [name] HYPERTENSION     History   Social History  . Marital Status: Married    Spouse Name: N/A    Number of Children: N/A  . Years of Education: N/A   Occupational History  . Not on file.   Social History Main Topics  . Smoking status: Former Smoker    Types: Cigarettes    Quit date: 02/24/1965  . Smokeless tobacco: Never Used  . Alcohol Use: No  . Drug Use: No  . Sexually Active: Not on file   Other Topics Concern  . Not on file   Social History Narrative  . No narrative on file    Past Surgical History  Procedure Date  . Vasectomy   . Tonsillectomy   . Coronary artery bypass graft 01/17/2006    x3 using a left internal mammary artery graft to left anterior descending coronary artery, saphenous vein graft to obtuse marginal branch, saphenous vein graft to posterior descending branch / Endoscopic vein harvesting from the right leg.  . Coronary angioplasty with stent placement 01/09/2006    Three-vessel CAD  His right coronary artery is extremely tight.  He has moderate to severe disease in the LAD & left circumflex artery.  The LAD is very heavily calcified and the proximal and ostial LAD is not a good target for PTCA.  We will refer him to CVTS for further evaluation  . Coronary angioplasty with stent placement 02/14/2000    EF  - of around 65-70%./PTCA and stenting of his mid right  coronary artery (4.0 x 18 mm NIR stent to the mid RCA).  He was noted  to have a 50% ostial stenosis at the time of his heart catheterization     Family History  Problem Relation Age of Onset  . Heart attack Father   . Stroke Mother     Allergies  Allergen Reactions  . Cozaar   . Crestor (Rosuvastatin Calcium)     Muscle aches  . Lipitor (Atorvastatin Calcium)     Muscle aches  . Lisinopril     REACTION: rash ?  . Lovastatin     Muscle aches  . Pravastatin     Muscle aches  . Zocor (Simvastatin - High Dose)     Muscle aches    Current Outpatient Prescriptions on File Prior to Visit  Medication Sig Dispense Refill  . ACCU-CHEK AVIVA PLUS test strip USE ONE EVERY DAY  100 each  4  . carvedilol (COREG) 12.5 MG tablet Take 12.5 mg by mouth. Taking 1 tablet in AM and 0.5 Tablet in PM      . Flaxseed, Linseed, POWD Take by mouth daily.        . Garlic 500 MG CAPS Take by mouth  daily.        . glipiZIDE (GLUCOTROL XL) 5 MG 24 hr tablet Take 1 tablet (5 mg total) by mouth daily.  90 tablet  4  . Krill Oil 300 MG CAPS Take 1 capsule by mouth daily.        . Lancets (ACCU-CHEK MULTICLIX) lancets USE DAILY  102 each  5  . Lancets Misc. (ACCU-CHEK MULTICLIX LANCET DEV) KIT USE DAILY  102 each  5  . metFORMIN (GLUCOPHAGE) 1000 MG tablet 1 tablet qAM and 1/2 tablet qPM  180 tablet  3  . Multiple Vitamin (MULTIVITAMIN) tablet Take 1 tablet by mouth daily.        . Multiple Vitamins-Minerals (VISION FORMULA) TABS Take by mouth daily.        . nitroGLYCERIN (NITROSTAT) 0.4 MG SL tablet Place 0.4 mg under the tongue every 5 (five) minutes as needed.        . travoprost, benzalkonium, (TRAVATAN) 0.004 % ophthalmic solution 1 drop at bedtime. As directed       . aspirin 81 MG tablet Take 81 mg by mouth as directed.         BP 156/80  Pulse 84  Temp 97.9 F (36.6 C) (Oral)  Resp 20  Wt 180 lb (81.647 kg)  SpO2  97%       Review of Systems  Constitutional: Negative for fever, chills, appetite change and fatigue.  HENT: Negative for hearing loss, ear pain, congestion, sore throat, trouble swallowing, neck stiffness, dental problem, voice change and tinnitus.   Eyes: Negative for pain, discharge and visual disturbance.  Respiratory: Negative for cough, chest tightness, wheezing and stridor.   Cardiovascular: Negative for chest pain, palpitations and leg swelling.  Gastrointestinal: Negative for nausea, vomiting, abdominal pain, diarrhea, constipation, blood in stool and abdominal distention.  Genitourinary: Negative for urgency, hematuria, flank pain, discharge, difficulty urinating and genital sores.  Musculoskeletal: Negative for myalgias, back pain, joint swelling, arthralgias and gait problem.  Skin: Negative for rash.  Neurological: Negative for dizziness, syncope, speech difficulty, weakness, numbness and headaches.  Hematological: Negative for adenopathy. Does not bruise/bleed easily.  Psychiatric/Behavioral: Negative for behavioral problems and dysphoric mood. The patient is not nervous/anxious.        Objective:   Physical Exam  Constitutional: He is oriented to person, place, and time. He appears well-developed.  HENT:  Head: Normocephalic.  Right Ear: External ear normal.  Left Ear: External ear normal.  Eyes: Conjunctivae normal and EOM are normal.  Neck: Normal range of motion.  Cardiovascular: Normal rate and normal heart sounds.   Pulmonary/Chest: Breath sounds normal.  Abdominal: Bowel sounds are normal.  Musculoskeletal: Normal range of motion. He exhibits no edema and no tenderness.  Neurological: He is alert and oriented to person, place, and time.  Psychiatric: He has a normal mood and affect. His behavior is normal.          Assessment & Plan:   Diabetes mellitus. Will check a hemoglobin A1c next visit. It has been slightly less than 3 months Hypertension  controlled Dyslipidemia Coronary artery disease  Medical regimen unchanged Orthopedic followup as planned Recheck here 3 months

## 2012-03-03 NOTE — Patient Instructions (Signed)
Please check your hemoglobin A1c every 3 months  You need to lose weight.  Consider a lower calorie diet and regular exercise.    It is important that you exercise regularly, at least 20 minutes 3 to 4 times per week.  If you develop chest pain or shortness of breath seek  medical attention.  Orthopedic followup as discussed

## 2012-03-05 NOTE — Telephone Encounter (Signed)
Pt said the ortho provider did not get fax

## 2012-03-09 ENCOUNTER — Encounter (HOSPITAL_BASED_OUTPATIENT_CLINIC_OR_DEPARTMENT_OTHER): Payer: Self-pay | Admitting: *Deleted

## 2012-03-09 ENCOUNTER — Telehealth: Payer: Self-pay | Admitting: Cardiovascular Disease

## 2012-03-09 ENCOUNTER — Encounter (HOSPITAL_BASED_OUTPATIENT_CLINIC_OR_DEPARTMENT_OTHER)
Admission: RE | Admit: 2012-03-09 | Discharge: 2012-03-09 | Disposition: A | Discharge status: Home / Self Care | Payer: Medicare Other | Source: Ambulatory Visit | Attending: Orthopedic Surgery | Admitting: Orthopedic Surgery

## 2012-03-09 DIAGNOSIS — I2510 Atherosclerotic heart disease of native coronary artery without angina pectoris: Secondary | ICD-10-CM

## 2012-03-09 LAB — BASIC METABOLIC PANEL
BUN: 12 mg/dL (ref 6–23)
Creatinine, Ser: 0.93 mg/dL (ref 0.50–1.35)
GFR calc Af Amer: >90 mL/min (ref >90)
GFR calc non Af Amer: 79 mL/min — ABNORMAL LOW (ref >90)
Glucose, Bld: 184 mg/dL — ABNORMAL HIGH (ref 70–99)

## 2012-03-09 NOTE — Telephone Encounter (Signed)
Pt having a foot repair on Thursday and they need a letter stating Dr. Elease Hashimoto is aware of surgery and it is ok the patient has this done

## 2012-03-09 NOTE — Progress Notes (Signed)
To come by for bmet States dr hewitt got cardiac clearence from dr Berdie Ogren ck ekg-3/13 Stress echo-10/12-good Has never had to take ntg cabg -07

## 2012-03-09 NOTE — Telephone Encounter (Signed)
I will forward to Dr Nahser to advise. 

## 2012-03-10 ENCOUNTER — Other Ambulatory Visit: Payer: Self-pay | Admitting: Orthopedic Surgery

## 2012-03-11 ENCOUNTER — Encounter (HOSPITAL_BASED_OUTPATIENT_CLINIC_OR_DEPARTMENT_OTHER)
Admission: RE | Disposition: A | Discharge status: Home / Self Care | Payer: Self-pay | Source: Ambulatory Visit | Attending: Orthopedic Surgery

## 2012-03-11 ENCOUNTER — Ambulatory Visit (HOSPITAL_BASED_OUTPATIENT_CLINIC_OR_DEPARTMENT_OTHER): Payer: Medicare Other | Admitting: Anesthesiology

## 2012-03-11 ENCOUNTER — Encounter (HOSPITAL_BASED_OUTPATIENT_CLINIC_OR_DEPARTMENT_OTHER): Payer: Self-pay | Admitting: Anesthesiology

## 2012-03-11 ENCOUNTER — Ambulatory Visit (HOSPITAL_BASED_OUTPATIENT_CLINIC_OR_DEPARTMENT_OTHER)
Admission: RE | Admit: 2012-03-11 | Discharge: 2012-03-11 | Disposition: A | Discharge status: Home / Self Care | Payer: Medicare Other | Source: Ambulatory Visit | Attending: Orthopedic Surgery | Admitting: Orthopedic Surgery

## 2012-03-11 ENCOUNTER — Encounter (HOSPITAL_BASED_OUTPATIENT_CLINIC_OR_DEPARTMENT_OTHER): Payer: Self-pay | Admitting: *Deleted

## 2012-03-11 DIAGNOSIS — I10 Essential (primary) hypertension: Secondary | ICD-10-CM | POA: Insufficient documentation

## 2012-03-11 DIAGNOSIS — M669 Spontaneous rupture of unspecified tendon: Secondary | ICD-10-CM

## 2012-03-11 DIAGNOSIS — E119 Type 2 diabetes mellitus without complications: Secondary | ICD-10-CM | POA: Insufficient documentation

## 2012-03-11 DIAGNOSIS — M66879 Spontaneous rupture of other tendons, unspecified ankle and foot: Secondary | ICD-10-CM | POA: Insufficient documentation

## 2012-03-11 HISTORY — PX: TENDON REPAIR: SHX5111

## 2012-03-11 HISTORY — PX: GASTROCNEMIUS RECESSION: SHX863

## 2012-03-11 LAB — GLUCOSE, CAPILLARY

## 2012-03-11 LAB — POCT HEMOGLOBIN-HEMACUE: Hemoglobin: 16.4 g/dL (ref 13.0–17.0)

## 2012-03-11 SURGERY — TENDON REPAIR
Anesthesia: Regional | Site: Ankle | Laterality: Right | Wound class: Clean

## 2012-03-11 MED ORDER — SODIUM CHLORIDE 0.9 % IV SOLN: INTRAVENOUS | Status: DC

## 2012-03-11 MED ORDER — ONDANSETRON HCL 4 MG/2ML IJ SOLN
INTRAMUSCULAR | Status: DC | PRN
  Administered 2012-03-11: 4 mg via INTRAVENOUS

## 2012-03-11 MED ORDER — 0.9 % SODIUM CHLORIDE (POUR BTL) OPTIME
TOPICAL | Status: DC | PRN
  Administered 2012-03-11: 600 mL

## 2012-03-11 MED ORDER — CHLORHEXIDINE GLUCONATE 4 % EX LIQD: 60.0000 mL | Freq: Once | CUTANEOUS | Status: DC

## 2012-03-11 MED ORDER — MIDAZOLAM HCL 2 MG/2ML IJ SOLN
1.0000 mg | INTRAMUSCULAR | Status: DC | PRN
  Administered 2012-03-11: 2 mg via INTRAVENOUS

## 2012-03-11 MED ORDER — LACTATED RINGERS IV SOLN
INTRAVENOUS | Status: DC
  Administered 2012-03-11 (×2): via INTRAVENOUS

## 2012-03-11 MED ORDER — DEXAMETHASONE SODIUM PHOSPHATE 4 MG/ML IJ SOLN
INTRAMUSCULAR | Status: DC | PRN
  Administered 2012-03-11: 5 mg via INTRAVENOUS

## 2012-03-11 MED ORDER — PROPOFOL 10 MG/ML IV BOLUS
INTRAVENOUS | Status: DC | PRN
  Administered 2012-03-11: 100 mg via INTRAVENOUS

## 2012-03-11 MED ORDER — BACITRACIN ZINC 500 UNIT/GM EX OINT
TOPICAL_OINTMENT | CUTANEOUS | Status: DC | PRN
  Administered 2012-03-11: 1 via TOPICAL

## 2012-03-11 MED ORDER — DOCUSATE SODIUM 100 MG PO CAPS: 100.0000 mg | ORAL_CAPSULE | Freq: Two times a day (BID) | ORAL | Status: DC

## 2012-03-11 MED ORDER — CEFAZOLIN SODIUM-DEXTROSE 2-3 GM-% IV SOLR
2.0000 g | INTRAVENOUS | Status: AC
  Administered 2012-03-11: 2 g via INTRAVENOUS

## 2012-03-11 MED ORDER — ASPIRIN EC 325 MG PO TBEC: 325.0000 mg | DELAYED_RELEASE_TABLET | Freq: Every day | ORAL | Status: DC

## 2012-03-11 MED ORDER — FENTANYL CITRATE 0.05 MG/ML IJ SOLN
50.0000 ug | INTRAMUSCULAR | Status: DC | PRN
  Administered 2012-03-11: 100 ug via INTRAVENOUS

## 2012-03-11 MED ORDER — SENNOSIDES 8.6 MG PO TABS: 2.0000 | ORAL_TABLET | Freq: Every day | ORAL | Status: DC

## 2012-03-11 MED ORDER — BUPIVACAINE-EPINEPHRINE PF 0.5-1:200000 % IJ SOLN
INTRAMUSCULAR | Status: DC | PRN
  Administered 2012-03-11: 30 mL

## 2012-03-11 MED ORDER — EPHEDRINE SULFATE 50 MG/ML IJ SOLN
INTRAMUSCULAR | Status: DC | PRN
  Administered 2012-03-11: 10 mg via INTRAVENOUS

## 2012-03-11 MED ORDER — OXYCODONE HCL 5 MG PO TABS: 5.0000 mg | ORAL_TABLET | ORAL | Status: DC | PRN

## 2012-03-11 SURGICAL SUPPLY — 84 items
BANDAGE ESMARK 6X9 LF (GAUZE/BANDAGES/DRESSINGS) ×1 IMPLANT
BLADE AVERAGE 25X9 (BLADE) IMPLANT
BLADE SURG 15 STRL LF DISP TIS (BLADE) ×2 IMPLANT
BLADE SURG 15 STRL SS (BLADE) ×6
BNDG CMPR 9X4 STRL LF SNTH (GAUZE/BANDAGES/DRESSINGS)
BNDG CMPR 9X6 STRL LF SNTH (GAUZE/BANDAGES/DRESSINGS) ×1
BNDG COHESIVE 4X5 TAN STRL (GAUZE/BANDAGES/DRESSINGS) ×2 IMPLANT
BNDG COHESIVE 6X5 TAN STRL LF (GAUZE/BANDAGES/DRESSINGS) ×2 IMPLANT
BNDG ESMARK 4X9 LF (GAUZE/BANDAGES/DRESSINGS) IMPLANT
BNDG ESMARK 6X9 LF (GAUZE/BANDAGES/DRESSINGS) ×2
CHLORAPREP W/TINT 26ML (MISCELLANEOUS) ×2 IMPLANT
CLOTH BEACON ORANGE TIMEOUT ST (SAFETY) ×2 IMPLANT
COVER TABLE BACK 60X90 (DRAPES) ×2 IMPLANT
CUFF TOURNIQUET SINGLE 18IN (TOURNIQUET CUFF) IMPLANT
CUFF TOURNIQUET SINGLE 34IN LL (TOURNIQUET CUFF) ×2 IMPLANT
DECANTER SPIKE VIAL GLASS SM (MISCELLANEOUS) IMPLANT
DRAPE C-ARM 42X72 X-RAY (DRAPES) IMPLANT
DRAPE C-ARMOR (DRAPES) IMPLANT
DRAPE EXTREMITY T 121X128X90 (DRAPE) ×2 IMPLANT
DRAPE INCISE IOBAN 66X45 STRL (DRAPES) IMPLANT
DRAPE OEC MINIVIEW 54X84 (DRAPES) ×1 IMPLANT
DRAPE U-SHAPE 47X51 STRL (DRAPES) ×2 IMPLANT
DRSG EMULSION OIL 3X3 NADH (GAUZE/BANDAGES/DRESSINGS) ×3 IMPLANT
DRSG PAD ABDOMINAL 8X10 ST (GAUZE/BANDAGES/DRESSINGS) ×4 IMPLANT
ELECT REM PT RETURN 9FT ADLT (ELECTROSURGICAL) ×2
ELECTRODE REM PT RTRN 9FT ADLT (ELECTROSURGICAL) ×1 IMPLANT
GLOVE BIO SURGEON STRL SZ8 (GLOVE) ×1 IMPLANT
GLOVE BIOGEL PI IND STRL 8 (GLOVE) ×1 IMPLANT
GLOVE BIOGEL PI INDICATOR 8 (GLOVE) ×1
GLOVE SKINSENSE NS SZ6.5 (GLOVE) ×1
GLOVE SKINSENSE NS SZ8.0 LF (GLOVE) ×2
GLOVE SKINSENSE STRL SZ6.5 (GLOVE) IMPLANT
GLOVE SKINSENSE STRL SZ8.0 LF (GLOVE) IMPLANT
GOWN BRE IMP PREV XXLGXLNG (GOWN DISPOSABLE) ×1 IMPLANT
GOWN PREVENTION PLUS XLARGE (GOWN DISPOSABLE) ×2 IMPLANT
GOWN PREVENTION PLUS XXLARGE (GOWN DISPOSABLE) ×3 IMPLANT
KIT BIO-TENODESIS 3X8 DISP (MISCELLANEOUS) ×2
KIT INSRT BABSR STRL DISP BTN (MISCELLANEOUS) IMPLANT
KWIRE 4.0 X .062IN (WIRE) IMPLANT
NDL HYPO 25X1 1.5 SAFETY (NEEDLE) IMPLANT
NDL SUT 6 .5 CRC .975X.05 MAYO (NEEDLE) IMPLANT
NEEDLE HYPO 22GX1.5 SAFETY (NEEDLE) IMPLANT
NEEDLE HYPO 25X1 1.5 SAFETY (NEEDLE) IMPLANT
NEEDLE MAYO TAPER (NEEDLE) ×2
NS IRRIG 1000ML POUR BTL (IV SOLUTION) ×2 IMPLANT
PACK BASIN DAY SURGERY FS (CUSTOM PROCEDURE TRAY) ×2 IMPLANT
PAD CAST 4YDX4 CTTN HI CHSV (CAST SUPPLIES) ×1 IMPLANT
PADDING CAST ABS 4INX4YD NS (CAST SUPPLIES)
PADDING CAST ABS COTTON 4X4 ST (CAST SUPPLIES) IMPLANT
PADDING CAST COTTON 4X4 STRL (CAST SUPPLIES) ×2
PADDING CAST COTTON 6X4 STRL (CAST SUPPLIES) ×2 IMPLANT
PASSER SUT SWANSON 36MM LOOP (INSTRUMENTS) IMPLANT
PENCIL BUTTON HOLSTER BLD 10FT (ELECTRODE) ×2 IMPLANT
SCOTCHCAST PLUS 4X4 WHITE (CAST SUPPLIES) ×2 IMPLANT
SCREW BIO TENODESIS 5.5 (Screw) ×1 IMPLANT
SHEET MEDIUM DRAPE 40X70 STRL (DRAPES) ×2 IMPLANT
SLEEVE SCD COMPRESS KNEE MED (MISCELLANEOUS) ×2 IMPLANT
SPLINT FAST PLASTER 5X30 (CAST SUPPLIES)
SPLINT PLASTER CAST FAST 5X30 (CAST SUPPLIES) ×20 IMPLANT
SPONGE GAUZE 4X4 12PLY (GAUZE/BANDAGES/DRESSINGS) ×2 IMPLANT
SPONGE LAP 18X18 X RAY DECT (DISPOSABLE) ×3 IMPLANT
STOCKINETTE 6  STRL (DRAPES) ×1
STOCKINETTE 6 STRL (DRAPES) ×1 IMPLANT
SUCTION FRAZIER TIP 10 FR DISP (SUCTIONS) IMPLANT
SUT ETHIBOND 0 MO6 C/R (SUTURE) ×1 IMPLANT
SUT ETHIBOND 2 OS 4 DA (SUTURE) IMPLANT
SUT ETHILON 3 0 PS 1 (SUTURE) ×3 IMPLANT
SUT FIBERWIRE 2-0 18 17.9 3/8 (SUTURE)
SUT MERSILENE 2.0 SH NDLE (SUTURE) IMPLANT
SUT MNCRL AB 3-0 PS2 18 (SUTURE) ×3 IMPLANT
SUT MNCRL AB 4-0 PS2 18 (SUTURE) IMPLANT
SUT VIC AB 0 SH 27 (SUTURE) ×3 IMPLANT
SUT VIC AB 2-0 SH 18 (SUTURE) IMPLANT
SUT VIC AB 2-0 SH 27 (SUTURE)
SUT VIC AB 2-0 SH 27XBRD (SUTURE) IMPLANT
SUT VICRYL 4-0 PS2 18IN ABS (SUTURE) IMPLANT
SUTURE FIBERWR 2-0 18 17.9 3/8 (SUTURE) IMPLANT
SYR BULB 3OZ (MISCELLANEOUS) ×2 IMPLANT
SYR CONTROL 10ML LL (SYRINGE) IMPLANT
TOWEL OR 17X24 6PK STRL BLUE (TOWEL DISPOSABLE) ×2 IMPLANT
TUBE CONNECTING 20X1/4 (TUBING) IMPLANT
UNDERPAD 30X30 INCONTINENT (UNDERPADS AND DIAPERS) ×2 IMPLANT
WATER STERILE IRR 1000ML POUR (IV SOLUTION) ×1 IMPLANT
YANKAUER SUCT BULB TIP NO VENT (SUCTIONS) IMPLANT

## 2012-03-11 NOTE — Anesthesia Postprocedure Evaluation (Signed)
  Anesthesia Post-op Note  Patient: Johnny [name].  Procedure(s) Performed: Procedure(s) (LRB) with comments: TENDON REPAIR (Right) - Right Tibialis Tendon Repair With Gastrocnemius Recession; Possible PLANTARIS AUTOGRAFT  GASTROCNEMIUS SLIDE (Right)  Patient Location: PACU  Anesthesia Type:GA combined with regional for post-op pain  Level of Consciousness: awake, alert  and oriented  Airway and Oxygen Therapy: Patient Spontanous Breathing and Patient connected to face mask oxygen  Post-op Pain: none  Post-op Assessment: Post-op Vital signs reviewed  Post-op Vital Signs: Reviewed  Complications: No apparent anesthesia complications

## 2012-03-11 NOTE — Brief Op Note (Addendum)
=  03/11/2012  11:11 AM  PATIENT:  Johnny [name]  79 y.o. male  PRE-OPERATIVE DIAGNOSIS:  Right Anterior Tibialis Tendon Rupture and Tight Heel Cord  POST-OPERATIVE DIAGNOSIS:  Right Anterior Tibialis Tendon Rupture and Tight Heel Cord  Procedure(s): 1.  Right gastrocnemius recession 2.  Deep tendon transfer of EHL to the medial cuneiform 3.  Autograft of plantaris tendon to augment the tibialis anterior repair 4.  fluoro  SURGEON:  Toni Arthurs, MD  ASSISTANT: n/a  ANESTHESIA:   General, regional  EBL:  minimal   TOURNIQUET:   Total Tourniquet Time Documented: Thigh (Right) - 28 minutes  COMPLICATIONS:  None apparent  DISPOSITION:  Extubated, awake and stable to recovery.  DICTATION ID:  [id number]

## 2012-03-11 NOTE — H&P (Signed)
Johnny [name]. is an 79 y.o. male.   Chief Complaint: right tibialis anterior rupture HPI: 79 y/o male with right anterior ankle pain since just before christmas.  MRI shows a rupture of the tibialis anterior tendon.  He presents now for repair as well as gastroc recession v. Tendo achilles lengthening.    Past Medical History  Diagnosis Date  . CORONARY ARTERY DISEASE Nov 2007    CABG x3   . DIABETES MELLITUS, TYPE II   . HYPERLIPIDEMIA     Not able to take statins.  [name] HYPERTENSION   . Arthritis     Past Surgical History  Procedure Date  . Vasectomy   . Tonsillectomy   . Coronary artery bypass graft 01/17/2006    x3 using a left internal mammary artery graft to left anterior descending coronary artery, saphenous vein graft to obtuse marginal branch, saphenous vein graft to posterior descending branch / Endoscopic vein harvesting from the right leg.  . Coronary angioplasty with stent placement 01/09/2006    Three-vessel CAD  His right coronary artery is extremely tight.  He has moderate to severe disease in the LAD & left circumflex artery.  The LAD is very heavily calcified and the proximal and ostial LAD is not a good target for PTCA.  We will refer him to CVTS for further evaluation  . Coronary angioplasty with stent placement 02/14/2000    EF - of around 65-70%./PTCA and stenting of his mid right  coronary artery (4.0 x 18 mm NIR stent to the mid RCA).  He was noted  to have a 50% ostial stenosis at the time of his heart catheterization   . Tonsillectomy     Family History  Problem Relation Age of Onset  . Heart attack Father   . Stroke Mother    Social History:  reports that he quit smoking about 47 years ago. His smoking use included Cigarettes. He has never used smokeless tobacco. He reports that he does not drink alcohol or use illicit drugs.  Allergies:  Allergies  Allergen Reactions  . Cozaar   . Crestor (Rosuvastatin Calcium)     Muscle aches  . Lipitor  (Atorvastatin Calcium)     Muscle aches  . Lisinopril     REACTION: rash ?  . Lovastatin     Muscle aches  . Pravastatin     Muscle aches  . Zocor (Simvastatin - High Dose)     Muscle aches    Medications Prior to Admission  Medication Sig Dispense Refill  . ACCU-CHEK AVIVA PLUS test strip USE ONE EVERY DAY  100 each  4  . aspirin 81 MG tablet Take 81 mg by mouth as directed.       . carvedilol (COREG) 12.5 MG tablet Take 12.5 mg by mouth. Taking 1 tablet in AM and 0.5 Tablet in PM      . Flaxseed, Linseed, POWD Take by mouth daily.        . Garlic 500 MG CAPS Take by mouth daily.        [name] glipiZIDE (GLUCOTROL XL) 5 MG 24 hr tablet Take 1 tablet (5 mg total) by mouth daily.  90 tablet  4  . Krill Oil 300 MG CAPS Take 1 capsule by mouth daily.        . Lancets (ACCU-CHEK MULTICLIX) lancets USE DAILY  102 each  5  . Lancets Misc. (ACCU-CHEK MULTICLIX LANCET DEV) KIT USE DAILY  102 each  5  .  metFORMIN (GLUCOPHAGE) 1000 MG tablet 1,000 mg 2 (two) times daily with a meal. 1 tablet qAM and 1/2 tablet qPM      . Multiple Vitamin (MULTIVITAMIN) tablet Take 1 tablet by mouth daily.        . Multiple Vitamins-Minerals (VISION FORMULA) TABS Take by mouth daily.        . nitroGLYCERIN (NITROSTAT) 0.4 MG SL tablet Place 0.4 mg under the tongue every 5 (five) minutes as needed.        . travoprost, benzalkonium, (TRAVATAN) 0.004 % ophthalmic solution 1 drop at bedtime. As directed         Results for orders placed during the hospital encounter of March 26, 2012 (from the past 48 hour(s))  BASIC METABOLIC PANEL     Status: Abnormal   Collection Time   03/09/12 12:00 PM      Component Value Range Comment   Sodium 135  135 - 145 mEq/L    Potassium 4.4  3.5 - 5.1 mEq/L    Chloride 97  96 - 112 mEq/L    CO2 25  19 - 32 mEq/L    Glucose, Bld 184 (*) 70 - 99 mg/dL    BUN 12  6 - 23 mg/dL    Creatinine, Ser 1.61  0.50 - 1.35 mg/dL    Calcium 09.6  8.4 - 10.5 mg/dL    GFR calc non Af Amer 79 (*) >90  mL/min    GFR calc Af Amer >90  >90 mL/min    No results found.  ROS  No recent f/c/n/v/wt loss  Blood pressure 179/72, pulse 70, temperature 98.4 F (36.9 C), temperature source Oral, resp. rate 20, height 5' 8.5" (1.74 m), weight 78.926 kg (174 lb), SpO2 96.00%. Physical Exam  wn wd male in nad.  A and O x 4.  Mood and affect normal.  EOMI.  Respirations unlabored.  Steppage gait on right.  Right ankle with healthy skin. 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  2+ dp and pt pulses.  Feels LT dorsally.  5/5 strength in PF.  Tight heelcord.    Assessment/Plan Right tibialis anterior rupture and tight heelcord - to OR for repair and achilles lengthening or gastroc recession.  The risks and benefits of the alternative treatment options have been discussed in detail.  The patient wishes to proceed with surgery and specifically understands risks of bleeding, infection, nerve damage, blood clots, need for additional surgery, amputation and death.   Toni Arthurs 2012-03-26, 7:25 AM

## 2012-03-11 NOTE — Anesthesia Procedure Notes (Addendum)
Anesthesia Regional Block:  Popliteal block  Pre-Anesthetic Checklist: ,, timeout performed, Correct Patient, Correct Site, Correct Laterality, Correct Procedure, Correct Position, site marked, Risks and benefits discussed,  Surgical consent,  Pre-op evaluation,  At surgeon's request and post-op pain management  Laterality: Right and Lower  Prep: chloraprep       Needles:  Injection technique: Single-shot  Needle Type: Echogenic Needle     Needle Length: 9cm  Needle Gauge: 21    Additional Needles:  Procedures: ultrasound guided (picture in chart) Popliteal block Narrative:  Start time: 03/11/2012 8:40 AM End time: 03/11/2012 8:46 AM Injection made incrementally with aspirations every 5 mL.  Performed by: Personally  Anesthesiologist: Sheldon Silvan, MD  Femoral nerve block Procedure Name: LMA Insertion Date/Time: 03/11/2012 9:06 AM Performed by: Burna Cash Pre-anesthesia Checklist: Patient identified, Emergency Drugs available, Suction available and Patient being monitored Patient Re-evaluated:Patient Re-evaluated prior to inductionOxygen Delivery Method: Circle System Utilized Preoxygenation: Pre-oxygenation with 100% oxygen Intubation Type: IV induction Ventilation: Mask ventilation without difficulty LMA: LMA inserted LMA Size: 5.0 Number of attempts: 1 Airway Equipment and Method: bite block Placement Confirmation: positive ETCO2 Tube secured with: Tape Dental Injury: Teeth and Oropharynx as per pre-operative assessment

## 2012-03-11 NOTE — Transfer of Care (Signed)
Immediate Anesthesia Transfer of Care Note  Patient: Johnny [name].  Procedure(s) Performed: Procedure(s) (LRB) with comments: TENDON REPAIR (Right) - Right Tibialis Tendon Repair With Gastrocnemius Recession; Possible PLANTARIS AUTOGRAFT  GASTROCNEMIUS SLIDE (Right)  Patient Location: PACU  Anesthesia Type:GA combined with regional for post-op pain  Level of Consciousness: sedated  Airway & Oxygen Therapy: Patient Spontanous Breathing and Patient connected to face mask oxygen  Post-op Assessment: Report given to PACU RN and Post -op Vital signs reviewed and stable  Post vital signs: Reviewed and stable  Complications: No apparent anesthesia complications

## 2012-03-11 NOTE — Progress Notes (Signed)
Assisted Dr. Crews with right, ultrasound guided, popliteal/saphenous block. Side rails up, monitors on throughout procedure. See vital signs in flow sheet. Tolerated Procedure well. 

## 2012-03-11 NOTE — Anesthesia Preprocedure Evaluation (Signed)
Anesthesia Evaluation  Patient identified by MRN, date of birth, ID band Patient awake    Reviewed: Allergy & Precautions, H&P , NPO status , Patient's Chart, lab work & pertinent test results, reviewed documented beta blocker date and time   Airway Mallampati: I TM Distance: >3 FB Neck ROM: Full    Dental  (+) Dental Advisory Given   Pulmonary  breath sounds clear to auscultation        Cardiovascular hypertension, Pt. on medications + CAD Rhythm:Regular Rate:Normal     Neuro/Psych    GI/Hepatic   Endo/Other  diabetes, Well Controlled, Type 2, Oral Hypoglycemic Agents  Renal/GU      Musculoskeletal   Abdominal   Peds  Hematology   Anesthesia Other Findings   Reproductive/Obstetrics                           Anesthesia Physical Anesthesia Plan  ASA: III  Anesthesia Plan: General   Post-op Pain Management:    Induction: Intravenous  Airway Management Planned: LMA  Additional Equipment:   Intra-op Plan:   Post-operative Plan: Extubation in OR  Informed Consent: I have reviewed the patients History and Physical, chart, labs and discussed the procedure including the risks, benefits and alternatives for the proposed anesthesia with the patient or authorized representative who has indicated his/her understanding and acceptance.   Dental advisory given  Plan Discussed with: CRNA, Anesthesiologist and Surgeon  Anesthesia Plan Comments:         Anesthesia Quick Evaluation

## 2012-03-12 ENCOUNTER — Encounter (HOSPITAL_BASED_OUTPATIENT_CLINIC_OR_DEPARTMENT_OTHER): Payer: Self-pay | Admitting: Orthopedic Surgery

## 2012-03-12 NOTE — Op Note (Signed)
NAME:  YOUSIF, EDELSON NO.:  [id number]  MEDICAL RECORD NO.:  [id number]  LOCATION:                                 FACILITY:  PHYSICIAN:  Toni Arthurs, MD             DATE OF BIRTH:  DATE OF PROCEDURE:  03/11/2012 DATE OF DISCHARGE:                              OPERATIVE REPORT   PREOPERATIVE DIAGNOSES: 1. Right tibialis anterior rupture. 2. Right tight heel cord.  POSTOPERATIVE DIAGNOSES: 1. Right tibialis anterior rupture. 2. Right tight heel cord.  PROCEDURE: 1. Right gastrocnemius recession. 2. Harvest of autograft plantaris tendon to augment the tibialis     anterior repair. 3. Deep tendon transfer of the extensor hallucis longus to the medial     cuneiform. 4. Intraoperative interpretation of fluoroscopic imaging.  SURGEON:  Toni Arthurs, MD  ANESTHESIA:  General, regional.  ESTIMATED BLOOD LOSS:  Minimal.  TOURNIQUET TIME:  28 minutes at 300 mmHg.  COMPLICATIONS:  None apparent.  DISPOSITION:  Extubated, awake, and stable to recovery.  INDICATION FOR PROCEDURE:  Patient is a 79 year old male with past medical history significant for type 2 diabetes.  Approximately 3 weeks ago, he was walking in his basement when he felt a pop in the front of his ankle.  He denies any trauma.  He had anterior ankle pain and difficulty walking.  He had an MRI which showed a rupture of his tibialis anterior tendon.  He presents now for operative treatment of this condition.  He understands the risks and benefits, the alternative treatment options and elects surgical treatment.  He specifically understands the risks of bleeding, infection, nerve damage, blood clots, need for additional surgery, amputation, and death.  PROCEDURE IN DETAIL:  After preoperative consent was obtained and the correct operative site was identified, the patient was brought to the operating room and placed supine on the operating table.  General anesthesia was induced.  Preoperative  antibiotics were administered. Surgical time-out was taken.  The right lower extremity was prepped and draped in standard sterile fashion with tourniquet around the thigh. The extremity was exsanguinated.  The tourniquet was inflated to 250 mmHg.  The patient's gastrocnemius was noted to be quite tight with Silfverskiold testing.  The decision was made to proceed with gastrocnemius recession.  A longitudinal incision was made along the medial aspect of the calf.  Sharp dissection was carried down through skin and subcutaneous tissue.  Superficial fascia was incised and the plantaris tendon was identified.  The tendon was mobilized proximally and distally.  An open-ended tendon stripper was placed over the tendon and passed in line with the leg up the plantaris tendon.  The tendon was released from its muscular attachments and pulled out through the incision.  The tendon stripper was then passed distally along the course of the tendon releasing it from its insertion at the medial Achilles. The tendon was just passed off the field where it was maintained in a moist gauze.  The gastrocnemius tendon was then identified.  It was freed superficially and deeply from the surrounding tissues.  It was transected from medial to lateral, and in its entirety.  With  the gastrocnemius tendon release, the ankle would dorsiflex approximately 20 degrees with the knee extended.  The wound was irrigated copiously. Inverted simple sutures of 3-0 Monocryl were used to close the subcutaneous tissue and a running 3-0 nylon was used to close the skin incision.  Attention was then turned to the anterior aspect of the ankle where a longitudinal incision was made over the tibialis anterior tendon.  Sharp dissection was carried down through the skin and subcutaneous tissue. The extensor retinaculum was incised.  The ruptured end of the tibialis anterior was identified.  The proximal end was cleared of all  adhesions. The tibialis anterior muscle was noted to still have some spring with gentle traction.  The tib ant tendon sheath was traced distally to the medial cuneiform.  The tendon was noted to be avulsed from the bone with essentially no remnant of tendon remaining at the cuneiform.  The ends of the tendon was trimmed of all of the mucinous tissue back to a level of healthy tibialis anterior tendon.  With the ankle maximally dorsiflexed, there was a gap of several centimeters from the tibialis anterior tendon reaching its insertion at the medial cuneiform.  The decision was made then to proceed with transfer of the extensor hallucis longus tendon into the medial cuneiform as an ankle dorsiflexor.  Attention was then turned to the dorsal aspect of the hallux.  A longitudinal incision was made over the proximal phalanx.  The extensor hallucis longus tendon was identified.  It was sewn to the extensor hallucis brevis tendon with multiple horizontal mattress sutures of 3-0 Ethibond.  This tenodesis was performed with the interphalangeal joint held in maximal dorsiflexion.  The EHL tendon was then transected just proximal from the tenodesis site.  The EHL was then identified at the more proximal incision and dissected free.  The cut end of the EHL was then pulled out through the proximal incision.  The tendon was doubled back on itself due to excess length.  The doubled over area of tendon was then sutured with a whipstitch of FiberWire.  The plantaris tendon was then woven into the remaining aspect of the EHL tendon as it was tenodesed to the tibialis anterior tendon proximally.  This was done with a running 0 Ethibond.  This created a tenodesed tibialis anterior and EHL proximal tendon that was roughly the same thickness as the tibialis anterior had been distally.  A guide pin was then inserted into the medial cuneiform.  AP and lateral fluoroscopic images confirmed appropriate  positioning of this guide pin. A 5.5-mm reamer was then advanced over the guide pin.  I then used to ream a hole through the medial cuneiform.  The guide pin was then used to pull the whipstitch the ends of the EHL and tibialis anterior down into the hole in the medial cuneiform.  The ankle was positioned in dorsiflexion and appropriate tension was placed on the whipstitched end of the tendon.  A 5.5 mm x 15 mm Arthrex Bio-Tenodesis Screw was then inserted into the tunnel and advanced until it was flushed with the dorsal cortex of the medial cuneiform.  It was noted to have excellent purchase.  There was no gapping or slippage noted at the tendon repair site.  The wound was irrigated copiously.  The extensor retinaculum was then repaired over the tendon repair using figure-of-eight sutures of 0 Vicryl.  Subcutaneous tissue was approximated with inverted simple sutures of 3-0 Monocryl and a running 3-0 nylon was used  to close the skin incision.  Sterile dressings were applied followed by a well-padded short-leg splint holding the ankle in slight dorsiflexion.  Tourniquet had been released early in the case at approximately 25 minutes.  This was due to the venous congestion.  Hemostasis was achieved prior to closure.  The patient was then awakened from anesthesia and transported to recovery room in stable condition.  FOLLOWUP PLAN:  The patient will be nonweightbearing for the next 2 weeks.  He will follow up with me in clinic for suture removal and conversion to a weightbearing cast.     Toni Arthurs, MD     JH/MEDQ  D:  03/11/2012  T:  03/12/2012  Job:  [id number]

## 2012-03-18 ENCOUNTER — Ambulatory Visit: Payer: Medicare Other | Admitting: Internal Medicine

## 2012-07-01 ENCOUNTER — Ambulatory Visit (INDEPENDENT_AMBULATORY_CARE_PROVIDER_SITE_OTHER): Payer: Medicare Other | Admitting: Internal Medicine

## 2012-07-01 ENCOUNTER — Encounter: Payer: Self-pay | Admitting: Internal Medicine

## 2012-07-01 VITALS — BP 132/74 | HR 72 | Temp 98.5°F | Resp 20 | Wt 169.0 lb

## 2012-07-01 DIAGNOSIS — I2510 Atherosclerotic heart disease of native coronary artery without angina pectoris: Secondary | ICD-10-CM

## 2012-07-01 DIAGNOSIS — E785 Hyperlipidemia, unspecified: Secondary | ICD-10-CM

## 2012-07-01 DIAGNOSIS — E119 Type 2 diabetes mellitus without complications: Secondary | ICD-10-CM

## 2012-07-01 DIAGNOSIS — I10 Essential (primary) hypertension: Secondary | ICD-10-CM

## 2012-07-01 NOTE — Progress Notes (Signed)
Subjective:    Patient ID: Johnny End., male    DOB: Mar 04, 1932, 79 y.o.   MRN: [id number]  HPI 79 year old patient who is seen today for followup of his type 2 diabetes. He is on glipizide and metformin. He maintains  nice  glycemic control. He has coronary artery disease which has been stable and also a history of hypertension. He has dyslipidemia but has been intolerant of statin therapy. Since his last visit here he had uneventful right foot surgery.   Past Medical History  Diagnosis Date  . CORONARY ARTERY DISEASE Nov 2007    CABG x3   . DIABETES MELLITUS, TYPE II   . HYPERLIPIDEMIA     Not able to take statins.  [name] HYPERTENSION   . Arthritis     History   Social History  . Marital Status: Married    Spouse Name: N/A    Number of Children: N/A  . Years of Education: N/A   Occupational History  . Not on file.   Social History Main Topics  . Smoking status: Former Smoker    Types: Cigarettes    Quit date: 02/24/1965  . Smokeless tobacco: Never Used  . Alcohol Use: No  . Drug Use: No  . Sexually Active: Not on file   Other Topics Concern  . Not on file   Social History Narrative  . No narrative on file    Past Surgical History  Procedure Laterality Date  . Vasectomy    . Tonsillectomy    . Coronary artery bypass graft  01/17/2006    x3 using a left internal mammary artery graft to left anterior descending coronary artery, saphenous vein graft to obtuse marginal branch, saphenous vein graft to posterior descending branch / Endoscopic vein harvesting from the right leg.  . Coronary angioplasty with stent placement  01/09/2006    Three-vessel CAD  His right coronary artery is extremely tight.  He has moderate to severe disease in the LAD & left circumflex artery.  The LAD is very heavily calcified and the proximal and ostial LAD is not a good target for PTCA.  We will refer him to CVTS for further evaluation  . Coronary angioplasty with stent placement   02/14/2000    EF - of around 65-70%./PTCA and stenting of his mid right  coronary artery (4.0 x 18 mm NIR stent to the mid RCA).  He was noted  to have a 50% ostial stenosis at the time of his heart catheterization   . Tonsillectomy    . Tendon repair  03/11/2012    Procedure: TENDON REPAIR;  Surgeon: Toni Arthurs, MD;  Location: [facility];  Service: Orthopedics;  Laterality: Right;  Right Tibialis Tendon Repair With Gastrocnemius Recession; Possible PLANTARIS AUTOGRAFT   . Gastrocnemius recession  03/11/2012    Procedure: GASTROCNEMIUS SLIDE;  Surgeon: Toni Arthurs, MD;  Location: [facility];  Service: Orthopedics;  Laterality: Right;    Family History  Problem Relation Age of Onset  . Heart attack Father   . Stroke Mother     Allergies  Allergen Reactions  . Cozaar   . Ace Inhibitors   . Crestor (Rosuvastatin Calcium)     Muscle aches  . Lipitor (Atorvastatin Calcium)     Muscle aches  . Lisinopril     REACTION: rash ?  . Lovastatin     Muscle aches  . Pravastatin     Muscle aches  . Zocor (Simvastatin - High  Dose)     Muscle aches  . Latex Rash    Current Outpatient Prescriptions on File Prior to Visit  Medication Sig Dispense Refill  . ACCU-CHEK AVIVA PLUS test strip USE ONE EVERY DAY  100 each  4  . aspirin 81 MG tablet Take 81 mg by mouth as directed.       . carvedilol (COREG) 12.5 MG tablet Take 12.5 mg by mouth. Taking 1 tablet in AM and 0.5 Tablet in PM      . docusate sodium (COLACE) 100 MG capsule Take 1 capsule (100 mg total) by mouth 2 (two) times daily. While taking narcotic pain medicine.  30 capsule  0  . Flaxseed, Linseed, POWD Take by mouth daily.        [name] glipiZIDE (GLUCOTROL XL) 5 MG 24 hr tablet Take 1 tablet (5 mg total) by mouth daily.  90 tablet  4  . Krill Oil 300 MG CAPS Take 1 capsule by mouth daily.        . Lancets (ACCU-CHEK MULTICLIX) lancets USE DAILY  102 each  5  . Lancets Misc. (ACCU-CHEK MULTICLIX LANCET DEV)  KIT USE DAILY  102 each  5  . metFORMIN (GLUCOPHAGE) 1000 MG tablet 1,000 mg 2 (two) times daily with a meal. 1 tablet qAM and 1/2 tablet qPM      . Multiple Vitamins-Minerals (VISION FORMULA) TABS Take by mouth daily.        . nitroGLYCERIN (NITROSTAT) 0.4 MG SL tablet Place 0.4 mg under the tongue every 5 (five) minutes as needed.        . travoprost, benzalkonium, (TRAVATAN) 0.004 % ophthalmic solution 1 drop at bedtime. As directed        No current facility-administered medications on file prior to visit.    BP 132/74  Pulse 72  Temp(Src) 98.5 F (36.9 C) (Oral)  Resp 20  Wt 169 lb (76.658 kg)  BMI 25.32 kg/m2  SpO2 97%     Review of Systems  Constitutional: Negative for fever, chills, appetite change and fatigue.  HENT: Negative for hearing loss, ear pain, congestion, sore throat, trouble swallowing, neck stiffness, dental problem, voice change and tinnitus.   Eyes: Negative for pain, discharge and visual disturbance.  Respiratory: Negative for cough, chest tightness, wheezing and stridor.   Cardiovascular: Negative for chest pain, palpitations and leg swelling.  Gastrointestinal: Negative for nausea, vomiting, abdominal pain, diarrhea, constipation, blood in stool and abdominal distention.  Genitourinary: Negative for urgency, hematuria, flank pain, discharge, difficulty urinating and genital sores.  Musculoskeletal: Negative for myalgias, back pain, joint swelling, arthralgias and gait problem.  Skin: Negative for rash.  Neurological: Negative for dizziness, syncope, speech difficulty, weakness, numbness and headaches.  Hematological: Negative for adenopathy. Does not bruise/bleed easily.  Psychiatric/Behavioral: Negative for behavioral problems and dysphoric mood. The patient is not nervous/anxious.        Objective:   Physical Exam  Constitutional: He is oriented to person, place, and time. He appears well-developed.  HENT:  Head: Normocephalic.  Right Ear:  External ear normal.  Left Ear: External ear normal.  Eyes: Conjunctivae and EOM are normal.  Neck: Normal range of motion.  Cardiovascular: Normal rate and normal heart sounds.   Feet warm but pedal pulses absent  Pulmonary/Chest: Breath sounds normal.  Abdominal: Bowel sounds are normal.  Musculoskeletal: Normal range of motion. He exhibits no edema and no tenderness.  Neurological: He is alert and oriented to person, place, and time.  Skin:  Well-healed surgical scar right ankle and foot  Psychiatric: He has a normal mood and affect. His behavior is normal.          Assessment & Plan:  Diabetes mellitus. Appears to be under good control. We'll check a hemoglobin A1c Hypertension well controlled. We'll continue present regimen Coronary artery disease. Asymptomatic  CPX 4 months

## 2012-07-01 NOTE — Patient Instructions (Signed)
Limit your sodium (Salt) intake   Please check your hemoglobin A1c every 3 months   

## 2012-07-02 ENCOUNTER — Telehealth: Payer: Self-pay | Admitting: *Deleted

## 2012-07-02 NOTE — Telephone Encounter (Signed)
Pt called and said his papers from  Yesterday says to return for CPX in 4 days. Told pt needs to return in 4 months for complete physical exam. Pt verbalized understanding and stated made appt in Sept and is not taking Colace anymore. Told pt okay will remove from med list.

## 2012-07-09 ENCOUNTER — Other Ambulatory Visit: Payer: Self-pay | Admitting: Internal Medicine

## 2012-07-29 ENCOUNTER — Other Ambulatory Visit: Payer: Self-pay | Admitting: Internal Medicine

## 2012-08-18 ENCOUNTER — Other Ambulatory Visit (INDEPENDENT_AMBULATORY_CARE_PROVIDER_SITE_OTHER): Payer: Medicare Other

## 2012-08-18 ENCOUNTER — Other Ambulatory Visit: Payer: Self-pay | Admitting: *Deleted

## 2012-08-18 DIAGNOSIS — E785 Hyperlipidemia, unspecified: Secondary | ICD-10-CM

## 2012-08-18 DIAGNOSIS — I2510 Atherosclerotic heart disease of native coronary artery without angina pectoris: Secondary | ICD-10-CM

## 2012-08-18 LAB — BASIC METABOLIC PANEL
BUN: 12 mg/dL (ref 6–23)
CO2: 25 mEq/L (ref 19–32)
Chloride: 104 mEq/L (ref 96–112)
Creatinine, Ser: 0.9 mg/dL (ref 0.4–1.5)
Glucose, Bld: 107 mg/dL — ABNORMAL HIGH (ref 70–99)
Potassium: 5.3 mEq/L — ABNORMAL HIGH (ref 3.5–5.1)

## 2012-08-18 LAB — HEPATIC FUNCTION PANEL
Albumin: 4 g/dL (ref 3.5–5.2)
Alkaline Phosphatase: 51 U/L (ref 39–117)
Bilirubin, Direct: 0 mg/dL (ref 0.0–0.3)
Total Bilirubin: 0.7 mg/dL (ref 0.3–1.2)

## 2012-08-18 LAB — LIPID PANEL
Total CHOL/HDL Ratio: 6
VLDL: 25.2 mg/dL (ref 0.0–40.0)

## 2012-08-18 NOTE — Progress Notes (Signed)
Fasting labs ordered

## 2012-08-25 ENCOUNTER — Encounter: Payer: Self-pay | Admitting: Cardiovascular Disease

## 2012-08-25 ENCOUNTER — Ambulatory Visit (INDEPENDENT_AMBULATORY_CARE_PROVIDER_SITE_OTHER): Payer: Medicare Other | Admitting: Cardiovascular Disease

## 2012-08-25 VITALS — BP 140/60 | HR 66 | Ht 68.5 in | Wt 162.0 lb

## 2012-08-25 DIAGNOSIS — E785 Hyperlipidemia, unspecified: Secondary | ICD-10-CM

## 2012-08-25 DIAGNOSIS — I10 Essential (primary) hypertension: Secondary | ICD-10-CM

## 2012-08-25 DIAGNOSIS — I2510 Atherosclerotic heart disease of native coronary artery without angina pectoris: Secondary | ICD-10-CM

## 2012-08-25 NOTE — Assessment & Plan Note (Signed)
Continue to follow his fasting lipid levels, liver enzymes, and electrolytes.

## 2012-08-25 NOTE — Assessment & Plan Note (Signed)
Johnny [name] continues to do well. He's not had any episodes of chest pain. We'll continue with same medications.

## 2012-08-25 NOTE — Patient Instructions (Addendum)
Your physician wants you to follow-up in: 6 months  You will receive a reminder letter in the mail two months in advance. If you don't receive a letter, please call our office to schedule the follow-up appointment.  You may request to see Dr Elease Hashimoto in [place] office if more convenient.  Your physician recommends that you return for a FASTING lipid profile: 6 months

## 2012-08-25 NOTE — Progress Notes (Signed)
Johnny [name]. Date of Birth  1932/09/23 [facility] [address]    [address] [place], [place]  [address] [phone]  Fax  [phone]  Problem list: 1. Coronary artery disease-status post CABG in 2007 2. Diabetes mellitus 3. Hyperlipidemia 4. Hypertension  History of Present Illness:  79 yo gentleman with a hx of CAD - s/p CABG, HTN,   He's had some recent episodes of dizziness. His blood pressure has also been elevated.  He felt poorly. He denies any chest pain or shortness breath.  He had lots of diaphoresis.  He called me last week complaining of not feeling well. It was difficult for him to describe exactly what his symptoms were but he did not describe any chest pain or shortness breath. He did have the sensation of flushing and also had profound diaphoresis. His blood pressure was noted to be elevated. He was seen by Lawson Fiscal here in the office. She increase his carvedilol and he presents today for further evaluation.  He 's feeling somewhat fatigued today.  He complains of left arm and left leg numbness.  He was walking up until 4-5 weeks ago ( 50 minutes a day 5-6 days a week without problems)  He has restarted his Aspirin and his coreg.  He has not had any angina.    August 25, 2012:  Johnny [name] ruptured a tendon in his right leg and is still having problems.  No CP    Current Outpatient Prescriptions on File Prior to Visit  Medication Sig Dispense Refill  . ACCU-CHEK AVIVA PLUS test strip USE ONE EVERY DAY  100 each  12  . aspirin 81 MG tablet Take 81 mg by mouth as directed.       . carvedilol (COREG) 12.5 MG tablet Take 12.5 mg by mouth. Taking 1 tablet in AM and 0.5 Tablet in PM      . Flaxseed, Linseed, POWD Take by mouth daily.        [name] glipiZIDE (GLUCOTROL XL) 5 MG 24 hr tablet Take 1 tablet (5 mg total) by mouth daily.  90 tablet  4  . Krill Oil 300 MG CAPS Take 1 capsule by mouth daily.        . Lancets (ACCU-CHEK MULTICLIX) lancets USE DAILY  102 each  5  .  Lancets Misc. (ACCU-CHEK MULTICLIX LANCET DEV) KIT USE DAILY  102 each  5  . metFORMIN (GLUCOPHAGE) 1000 MG tablet 1,000 mg 2 (two) times daily with a meal. 1 tablet qAM and 1/2 tablet qPM      . Multiple Vitamins-Minerals (VISION FORMULA) TABS Take by mouth daily.        [name] NITROSTAT 0.4 MG SL tablet DISSOLVE 1 TABLET UNDER THE TONGUE AS NEEDED FOR CHEST PAIN  25 tablet  0  . travoprost, benzalkonium, (TRAVATAN) 0.004 % ophthalmic solution 1 drop at bedtime. As directed        No current facility-administered medications on file prior to visit.    Allergies  Allergen Reactions  . Cozaar   . Ace Inhibitors   . Crestor (Rosuvastatin Calcium)     Muscle aches  . Lipitor (Atorvastatin Calcium)     Muscle aches  . Lisinopril     REACTION: rash ?  . Lovastatin     Muscle aches  . Pravastatin     Muscle aches  . Zocor (Simvastatin - High Dose)     Muscle aches  . Latex Rash    Past Medical History  Diagnosis Date  . CORONARY ARTERY DISEASE Nov 2007    CABG x3   . DIABETES MELLITUS, TYPE II   . HYPERLIPIDEMIA     Not able to take statins.  [name] HYPERTENSION   . Arthritis     Past Surgical History  Procedure Laterality Date  . Vasectomy    . Tonsillectomy    . Coronary artery bypass graft  01/17/2006    x3 using a left internal mammary artery graft to left anterior descending coronary artery, saphenous vein graft to obtuse marginal branch, saphenous vein graft to posterior descending branch / Endoscopic vein harvesting from the right leg.  . Coronary angioplasty with stent placement  01/09/2006    Three-vessel CAD  His right coronary artery is extremely tight.  He has moderate to severe disease in the LAD & left circumflex artery.  The LAD is very heavily calcified and the proximal and ostial LAD is not a good target for PTCA.  We will refer him to CVTS for further evaluation  . Coronary angioplasty with stent placement  02/14/2000    EF - of around 65-70%./PTCA and stenting of  his mid right  coronary artery (4.0 x 18 mm NIR stent to the mid RCA).  He was noted  to have a 50% ostial stenosis at the time of his heart catheterization   . Tonsillectomy    . Tendon repair  03/11/2012    Procedure: TENDON REPAIR;  Surgeon: Toni Arthurs, MD;  Location: [facility];  Service: Orthopedics;  Laterality: Right;  Right Tibialis Tendon Repair With Gastrocnemius Recession; Possible PLANTARIS AUTOGRAFT   . Gastrocnemius recession  03/11/2012    Procedure: GASTROCNEMIUS SLIDE;  Surgeon: Toni Arthurs, MD;  Location: [facility];  Service: Orthopedics;  Laterality: Right;    History  Smoking status  . Former Smoker  . Types: Cigarettes  . Quit date: 02/24/1965  Smokeless tobacco  . Never Used    History  Alcohol Use No    Family History  Problem Relation Age of Onset  . Heart attack Father   . Stroke Mother     Reviw of Systems:  Reviewed in the HPI.  All other systems are negative.  Physical Exam: BP 140/60  Pulse 66  Ht 5' 8.5" (1.74 m)  Wt 162 lb (73.483 kg)  BMI 24.27 kg/m2   The patient is alert and oriented x 3.  The mood and affect are normal.   Skin: warm and dry.  Color is normal.    HEENT:   the sclera are nonicteric.  The mucous membranes are moist.  The carotids are 2+ without bruits.  There is no thyromegaly.  There is no JVD.    Lungs: clear.  The chest wall is non tender.    Heart: regular rate with a normal S1 and S2.  There are no murmurs, gallops, or rubs. The PMI is not displaced.     Abdomen: good bowel sounds.  There is no guarding or rebound.  There is no hepatosplenomegaly or tenderness.  There are no masses.   Extremities:  no clubbing, cyanosis, or edema.  The legs are without rashes.  The distal pulses are intact.   Neuro:  Cranial nerves II - XII are intact.  Motor and sensory functions are intact.    The gait is normal.  ECG: August 25, 2012:  NSR at 36. Normal ECG Assessment / Plan:

## 2012-08-25 NOTE — Assessment & Plan Note (Signed)
His blood pressure remains mildly elevated. I've asked him to make she is not eating any extra salt. We'll have him measure his blood pressure on a regular basis.

## 2012-11-02 ENCOUNTER — Encounter: Payer: Medicare Other | Admitting: Internal Medicine

## 2012-11-05 ENCOUNTER — Encounter: Payer: Self-pay | Admitting: Internal Medicine

## 2012-11-05 ENCOUNTER — Ambulatory Visit (INDEPENDENT_AMBULATORY_CARE_PROVIDER_SITE_OTHER): Payer: Medicare Other | Admitting: Internal Medicine

## 2012-11-05 VITALS — BP 140/80 | HR 60 | Temp 98.4°F | Resp 20 | Ht 66.0 in | Wt 160.0 lb

## 2012-11-05 DIAGNOSIS — E119 Type 2 diabetes mellitus without complications: Secondary | ICD-10-CM

## 2012-11-05 DIAGNOSIS — I10 Essential (primary) hypertension: Secondary | ICD-10-CM

## 2012-11-05 DIAGNOSIS — E785 Hyperlipidemia, unspecified: Secondary | ICD-10-CM

## 2012-11-05 DIAGNOSIS — I2510 Atherosclerotic heart disease of native coronary artery without angina pectoris: Secondary | ICD-10-CM

## 2012-11-05 DIAGNOSIS — Z0000 Encounter for general adult medical examination without abnormal findings: Secondary | ICD-10-CM

## 2012-11-05 LAB — MICROALBUMIN / CREATININE URINE RATIO
Creatinine,U: 140.1 mg/dL
Microalb Creat Ratio: 27.5 mg/g (ref 0.0–30.0)

## 2012-11-05 NOTE — Patient Instructions (Addendum)
Resume metformin at a dose of 1000 mg twice daily Hold the glipizide but resume if  blood sugars become excessively elevated   Please check your hemoglobin A1c every 3 months    It is important that you exercise regularly, at least 20 minutes 3 to 4 times per week.  If you develop chest pain or shortness of breath seek  medical attention.  Limit your sodium (Salt) intake

## 2012-11-05 NOTE — Progress Notes (Signed)
Subjective:    Patient ID: Johnny End., male    DOB: [birth year], 79 y.o.   MRN: [id number]  HPI    Review of Systems     Objective:   Physical Exam        Assessment & Plan:  Patient ID: Johnny [name]., male   DOB: 1932/05/09, 79 y.o.   MRN: [id number]  Subjective:    Patient ID: Johnny End., male    DOB: 1933-01-09, 79 y.o.   MRN: [id number]  Diabetes Pertinent negatives for hypoglycemia include no confusion, dizziness, headaches, nervousness/anxiousness, seizures, speech difficulty or tremors. Pertinent negatives for diabetes include no chest pain, no fatigue and no weakness.  79 year-old patient who is seen today for an annual exam. He is followed by cardiology biannually for coronary artery disease. This has been stable. He has type 2 diabetes on dual therapy. No concerns or complaints today. He has dyslipidemia controlled on niacin.  Has a history of statin intolerance  Due to concerns of hypoglycemia he is down titrated metformin to 500 mg daily. He remains on glipizide  1. Risk factors, based on past  M,S,F history-  patient has known coronary artery disease coronary vascular risk factors include diabetes dyslipidemia and hypertension  2.  Physical activities: Remains quite active physically he goes to a health club 3 times weekly 3.  Depression/mood: No history of depression or mood disorder  4.  Hearing: Mild hearing deficit  5.  ADL's: Independent in all aspects of daily living 6.  Fall risk: Low  7.  Home safety: No problems identified  8.  Height weight, and visual acuity; height and weight stable no change in visual acuity has a cataract extraction surgery on the left. Is followed by ophthalmology at least annually  9.  Counseling: Heart healthy diet regular exercise modest weight loss all encouraged low sodium diet encouraged 10. Lab orders based on risk factors:  11. Referral: Followup cardiology and ophthalmology as planned  12. Care  plan: Heart healthy diet modest weight loss all encouraged cardiology followup encouraged  13. Cognitive assessment: Alert and oriented with normal affect. No cognitive dysfunction.       Review of Systems  Constitutional: Negative for fever, chills, activity change, appetite change and fatigue.  HENT: Negative for hearing loss, ear pain, congestion, rhinorrhea, sneezing, mouth sores, trouble swallowing, neck pain, neck stiffness, dental problem, voice change, sinus pressure and tinnitus.   Eyes: Negative for photophobia, pain, redness and visual disturbance.  Respiratory: Negative for apnea, cough, choking, chest tightness, shortness of breath and wheezing.   Cardiovascular: Negative for chest pain, palpitations and leg swelling.  Gastrointestinal: Negative for nausea, vomiting, abdominal pain, diarrhea, constipation, blood in stool, abdominal distention, anal bleeding and rectal pain.  Genitourinary: Negative for dysuria, urgency, frequency, hematuria, flank pain, decreased urine volume, discharge, penile swelling, scrotal swelling, difficulty urinating, genital sores and testicular pain.  Musculoskeletal: Negative for myalgias, back pain, joint swelling, arthralgias and gait problem.  Skin: Negative for color change, rash and wound.  Neurological: Negative for dizziness, tremors, seizures, syncope, facial asymmetry, speech difficulty, weakness, light-headedness, numbness and headaches.  Hematological: Negative for adenopathy. Does not bruise/bleed easily.  Psychiatric/Behavioral: Negative for suicidal ideas, hallucinations, behavioral problems, confusion, disturbed wake/sleep cycle, self-injury, dysphoric mood, decreased concentration and agitation. The patient is not nervous/anxious.        Objective:   Physical Exam  Constitutional: He appears well-developed and well-nourished.  HENT:  Head: Normocephalic and atraumatic.  Right Ear: External ear normal.  Left Ear: External ear  normal.  Nose: Nose normal.  Mouth/Throat: Oropharynx is clear and moist.  Eyes: Conjunctivae and EOM are normal. Pupils are equal, round, and reactive to light. No scleral icterus.  Neck: Normal range of motion. Neck supple. No JVD present. No thyromegaly present.  Cardiovascular: Normal rate, regular rhythm and normal heart sounds.  Exam reveals no gallop and no friction rub.   No murmur heard.      Sternotomy scar right dorsalis pedis pulse only palpable  Pulmonary/Chest: Effort normal and breath sounds normal. He exhibits no tenderness.  Abdominal: Soft. Bowel sounds are normal. He exhibits no distension and no mass. There is no tenderness.  Genitourinary: Prostate normal and penis normal.  Musculoskeletal: Normal range of motion. He exhibits no edema and no tenderness.  Lymphadenopathy:    He has no cervical adenopathy.  Neurological: He is alert. He has normal reflexes. No cranial nerve deficit. Coordination normal.  Skin: Skin is warm and dry. No rash noted.       Onychomycotic nails  Psychiatric: He has a normal mood and affect. His behavior is normal.          Assessment & Plan:   Unremarkable annual exam Coronary artery disease. Clinically stable. Type 2 diabetes. We'll check a hemoglobin A1c and urine for microalbumin Dyslipidemia.  Lipid profile done in the spring  Lifestyle issues addressed. We'll recheck in 3 months

## 2012-11-10 ENCOUNTER — Telehealth: Payer: Self-pay | Admitting: Internal Medicine

## 2012-11-10 NOTE — Telephone Encounter (Signed)
Pt would like you to please call him to explain his lab results. He stopped by today, and picked up a copy, and he would like to speak with a nurse to determine the details. Please assist.

## 2012-11-11 NOTE — Telephone Encounter (Signed)
Spoke to pt told him labs were okay Hemoglobin A1c 6.6 and other lab all right. Pt concerned about labs that were done by Cardiologist. Told pt if he likes can make an appt to discuss with Dr.K. Pt verbalized understanding.

## 2012-11-15 ENCOUNTER — Other Ambulatory Visit: Payer: Self-pay | Admitting: Internal Medicine

## 2012-11-17 MED ORDER — METFORMIN HCL 1000 MG PO TABS: ORAL_TABLET | ORAL | Status: DC

## 2012-11-17 NOTE — Addendum Note (Signed)
Addended by: Jimmye Norman on: 11/17/2012 08:11 AM   Modules accepted: Orders

## 2012-11-29 ENCOUNTER — Other Ambulatory Visit: Payer: Self-pay | Admitting: Internal Medicine

## 2012-12-24 ENCOUNTER — Telehealth: Payer: Self-pay | Admitting: Internal Medicine

## 2012-12-24 NOTE — Telephone Encounter (Signed)
Pt has sore throat. Pt had a procedure done ?endo. Pt would like appt today. Can I work pt in? Pt decline to see another provider

## 2012-12-24 NOTE — Telephone Encounter (Signed)
Pt states he had surgery and called [facility]. They told him he had a tube down his throat and that is probably the reason his throat is sore. Disregard appt

## 2013-02-01 ENCOUNTER — Ambulatory Visit (INDEPENDENT_AMBULATORY_CARE_PROVIDER_SITE_OTHER): Payer: Medicare Other | Admitting: Cardiovascular Disease

## 2013-02-01 ENCOUNTER — Encounter: Payer: Self-pay | Admitting: Cardiovascular Disease

## 2013-02-01 VITALS — BP 164/72 | HR 65 | Ht 66.0 in | Wt 147.5 lb

## 2013-02-01 DIAGNOSIS — R634 Abnormal weight loss: Secondary | ICD-10-CM

## 2013-02-01 DIAGNOSIS — I2510 Atherosclerotic heart disease of native coronary artery without angina pectoris: Secondary | ICD-10-CM

## 2013-02-01 NOTE — Progress Notes (Signed)
Johnny [name]. Date of Birth  1932-05-30 Johnny [name] [address]    [address] [place], [place]  [address] [phone]  Fax  [phone]  Problem list: 1. Coronary artery disease-status post CABG in 2007 2. Diabetes mellitus 3. Hyperlipidemia 4. Hypertension  History of Present Illness:  79 yo gentleman with a hx of CAD - s/p CABG, HTN,   He's had some recent episodes of dizziness. His blood pressure has also been elevated.  He felt poorly. He denies any chest pain or shortness breath.  He had lots of diaphoresis.  He called me last week complaining of not feeling well. It was difficult for him to describe exactly what his symptoms were but he did not describe any chest pain or shortness breath. He did have the sensation of flushing and also had profound diaphoresis. His blood pressure was noted to be elevated. He was seen by Johnny [name] here in the office. She increase his carvedilol and he presents today for further evaluation.  He 's feeling somewhat fatigued today.  He complains of left arm and left leg numbness.  He was walking up until 4-5 weeks ago ( 50 minutes a day 5-6 days a week without problems)  He has restarted his Aspirin and his coreg.  He has not had any angina.    August 25, 2012:  Johnny [name] ruptured a tendon in his right leg and is still having problems.  No CP   Dec. 9, 2014: Johnny [name] was found to have a superficial DVT. He's been on Lovenox for the past 7 days.   He has had a kidney stone.   He also has had a ruptured tendon in his right leg earlier this year.    He has not as active.  His wife states that he is tired.    He has lost 40 lbs this past 6 months.   I suspect that these 2 complaints are related.   He has chills, no fevers, no sweats. He is eating OK - he quit drinking sodas which may be contributing to this weight loss. His BP is high here but his wife states that his BP is normal and even low at home.     Current Outpatient Prescriptions on File Prior  to Visit  Medication Sig Dispense Refill  . ACCU-CHEK AVIVA PLUS test strip USE ONE EVERY DAY  100 each  12  . aspirin 81 MG tablet Take 81 mg by mouth as directed.       . carvedilol (COREG) 12.5 MG tablet Take 12.5 mg by mouth. Taking 1 tablet in AM and 1Tablet in PM      . Flaxseed, Linseed, POWD Take by mouth daily.        . Lancets (ACCU-CHEK MULTICLIX) lancets USE DAILY  102 each  5  . NITROSTAT 0.4 MG SL tablet DISSOLVE 1 TABLET UNDER THE TONGUE AS NEEDED FOR CHEST PAIN  25 tablet  0  . travoprost, benzalkonium, (TRAVATAN) 0.004 % ophthalmic solution 1 drop at bedtime. As directed        No current facility-administered medications on file prior to visit.    Allergies  Allergen Reactions  . Cozaar   . Ace Inhibitors   . Crestor [Rosuvastatin Calcium]     Muscle aches  . Lipitor [Atorvastatin Calcium]     Muscle aches  . Lisinopril     REACTION: rash ?  . Lovastatin     Muscle aches  . Pravastatin  Muscle aches  . Zocor [Simvastatin - High Dose]     Muscle aches  . Latex Rash    Past Medical History  Diagnosis Date  . CORONARY ARTERY DISEASE Nov 2007    CABG x3   . DIABETES MELLITUS, TYPE II   . HYPERLIPIDEMIA     Not able to take statins.  [name] HYPERTENSION   . Arthritis   . History of kidney stones     Past Surgical History  Procedure Laterality Date  . Vasectomy    . Tonsillectomy    . Coronary artery bypass graft  01/17/2006    x3 using a left internal mammary artery graft to left anterior descending coronary artery, saphenous vein graft to obtuse marginal branch, saphenous vein graft to posterior descending branch / Endoscopic vein harvesting from the right leg.  . Coronary angioplasty with stent placement  01/09/2006    Three-vessel CAD  His right coronary artery is extremely tight.  He has moderate to severe disease in the LAD & left circumflex artery.  The LAD is very heavily calcified and the proximal and ostial LAD is not a good target for PTCA.  We  will refer him to CVTS for further evaluation  . Coronary angioplasty with stent placement  02/14/2000    EF - of around 65-70%./PTCA and stenting of his mid right  coronary artery (4.0 x 18 mm NIR stent to the mid RCA).  He was noted  to have a 50% ostial stenosis at the time of his heart catheterization   . Tonsillectomy    . Tendon repair  03/11/2012    Procedure: TENDON REPAIR;  Surgeon: Toni Arthurs, MD;  Location: [facility];  Service: Orthopedics;  Laterality: Right;  Right Tibialis Tendon Repair With Gastrocnemius Recession; Possible PLANTARIS AUTOGRAFT   . Gastrocnemius recession  03/11/2012    Procedure: GASTROCNEMIUS SLIDE;  Surgeon: Toni Arthurs, MD;  Location: [facility];  Service: Orthopedics;  Laterality: Right;    History  Smoking status  . Former Smoker  . Types: Cigarettes  . Quit date: 02/24/1965  Smokeless tobacco  . Never Used    History  Alcohol Use No    Family History  Problem Relation Age of Onset  . Heart attack Father   . Stroke Mother     Reviw of Systems:  Reviewed in the HPI.  All other systems are negative.  Physical Exam: BP 164/72  Pulse 65  Ht 5\' 6"  (1.676 m)  Wt 147 lb 8 oz (66.906 kg)  BMI 23.82 kg/m2   The patient is alert and oriented x 3.  The mood and affect are normal.   Skin: warm and dry.  Color is normal.    HEENT:   the sclera are nonicteric.  The mucous membranes are moist.  The carotids are 2+ without bruits.  There is no thyromegaly.  There is no JVD.    Lungs: clear.  The chest wall is non tender.    Heart: regular rate with a normal S1 and S2.  There are no murmurs, gallops, or rubs. The PMI is not displaced.     Abdomen: good bowel sounds.  There is no guarding or rebound.  There is no hepatosplenomegaly or tenderness.  There are no masses.   Extremities:  no clubbing, cyanosis, or edema.  The legs are without rashes.  The distal pulses are intact.   Neuro:  Cranial nerves II - XII are  intact.  Motor and sensory  functions are intact.    The gait is normal.  ECG: August 25, 2012:  NSR at 42. Normal ECG Assessment / Plan:

## 2013-02-01 NOTE — Assessment & Plan Note (Signed)
Johnny [name] not having any cardiac complaints. He does complain of persistent fatigue. He also has lost 40 pounds over the past 6 months. He does note that he stopped drinking sodas last year but is difficult to think that eliminating  soft drinks would result in a 40 pound weight loss.  Im Concerned that his fatigue and his weight loss are related.  From a cardiac standpoint we should continue with his same medications. He'll be seen his primary medical doctor later this week.  Think that he needs basic labs including a TSH. He also has had a kidney stone removed. He may need a CT scan. His abdomen feels fairly benign today. He did have significant urinary bleeding following removal of his kidney stone

## 2013-02-01 NOTE — Patient Instructions (Signed)
Your physician recommends that you continue on your current medications as directed. Please refer to the Current Medication list given to you today.  Your physician recommends that you schedule a follow-up appointment in: 6 months  

## 2013-02-01 NOTE — Assessment & Plan Note (Signed)
He has lost 40 pounds over the past several months. He does not that he stop drinking regular Coke but it is unlikely that this is responsible for the whole weight loss. He also is having lots of fatigue. Asked him to review these findings with Dr.[email or web address],PETER Homero Fellers, MD

## 2013-02-02 ENCOUNTER — Other Ambulatory Visit: Payer: Self-pay | Admitting: Internal Medicine

## 2013-02-03 ENCOUNTER — Ambulatory Visit (INDEPENDENT_AMBULATORY_CARE_PROVIDER_SITE_OTHER)
Admission: RE | Admit: 2013-02-03 | Discharge: 2013-02-03 | Disposition: A | Discharge status: Home / Self Care | Payer: Medicare Other | Source: Ambulatory Visit | Attending: Internal Medicine | Admitting: Internal Medicine

## 2013-02-03 ENCOUNTER — Encounter: Payer: Self-pay | Admitting: Internal Medicine

## 2013-02-03 ENCOUNTER — Telehealth: Payer: Self-pay | Admitting: Internal Medicine

## 2013-02-03 ENCOUNTER — Ambulatory Visit (INDEPENDENT_AMBULATORY_CARE_PROVIDER_SITE_OTHER): Payer: Medicare Other | Admitting: Internal Medicine

## 2013-02-03 VITALS — BP 142/76 | HR 71 | Temp 98.3°F | Resp 20 | Wt 148.0 lb

## 2013-02-03 DIAGNOSIS — R634 Abnormal weight loss: Secondary | ICD-10-CM

## 2013-02-03 DIAGNOSIS — E785 Hyperlipidemia, unspecified: Secondary | ICD-10-CM

## 2013-02-03 DIAGNOSIS — I10 Essential (primary) hypertension: Secondary | ICD-10-CM

## 2013-02-03 DIAGNOSIS — E119 Type 2 diabetes mellitus without complications: Secondary | ICD-10-CM

## 2013-02-03 DIAGNOSIS — I2510 Atherosclerotic heart disease of native coronary artery without angina pectoris: Secondary | ICD-10-CM

## 2013-02-03 LAB — COMPREHENSIVE METABOLIC PANEL
ALT: 15 U/L (ref 0–53)
Albumin: 3.9 g/dL (ref 3.5–5.2)
Alkaline Phosphatase: 61 U/L (ref 39–117)
CO2: 26 mEq/L (ref 19–32)
Calcium: 9.8 mg/dL (ref 8.4–10.5)
GFR: 78.5 mL/min (ref >60.00)
Sodium: 136 mEq/L (ref 135–145)
Total Bilirubin: 0.7 mg/dL (ref 0.3–1.2)
Total Protein: 7 g/dL (ref 6.0–8.3)

## 2013-02-03 LAB — CBC WITH DIFFERENTIAL/PLATELET
Basophils Absolute: 0 10*3/uL (ref 0.0–0.1)
HCT: 38.2 % — ABNORMAL LOW (ref 39.0–52.0)
Lymphs Abs: 2 10*3/uL (ref 0.7–4.0)
Monocytes Absolute: 0.8 10*3/uL (ref 0.1–1.0)
Monocytes Relative: 9.5 % (ref 3.0–12.0)
Neutrophils Relative %: 62.8 % (ref 43.0–77.0)
Platelets: 452 10*3/uL — ABNORMAL HIGH (ref 150.0–400.0)
RDW: 13.3 % (ref 11.5–14.6)

## 2013-02-03 LAB — HEMOGLOBIN A1C: Hgb A1c MFr Bld: 6.8 % — ABNORMAL HIGH (ref 4.6–6.5)

## 2013-02-03 LAB — TSH: TSH: 2.47 u[IU]/mL (ref 0.35–5.50)

## 2013-02-03 LAB — SEDIMENTATION RATE: Sed Rate: 31 mm/hr — ABNORMAL HIGH (ref 0–22)

## 2013-02-03 NOTE — Telephone Encounter (Signed)
Pt would like a copy of his Lab results when they are available.  States "ok to leave message on vm and he will come pick up the copy".

## 2013-02-03 NOTE — Patient Instructions (Addendum)
Limit your sodium (Salt) intake  Return in one year for follow-upm  Return slides to check stools for hidden blood

## 2013-02-03 NOTE — Progress Notes (Signed)
Pre-visit discussion using our clinic review tool. No additional management support is needed unless otherwise documented below in the visit note.  

## 2013-02-03 NOTE — Progress Notes (Signed)
Subjective:    Patient ID: Johnny End., male    DOB: 1932-09-12, 79 y.o.   MRN: [id number]  HPI  Wt Readings from Last 3 Encounters:  02/03/13 148 lb (67.132 kg)  02/01/13 147 lb 8 oz (66.906 kg)  11/05/12 160 lb (72.38 kg)   79 year old patient who has a history of type 2 diabetes. He is followed by cardiology for coronary artery disease. He has hypertension and dyslipidemia. He was seen 3 months ago and over this period of time he has developed much more fatigued anorexia and weight loss. Denies any change in his bowel habits. His cardiac status seems fairly stable and was seen by cardiology 2 days ago. Last glycemic control.  He has changed his diet and no longer drinks soft drinks but clearly has had a decrease in appetite Past Medical History  Diagnosis Date  . CORONARY ARTERY DISEASE Nov 2007    CABG x3   . DIABETES MELLITUS, TYPE II   . HYPERLIPIDEMIA     Not able to take statins.  [name] HYPERTENSION   . Arthritis   . History of kidney stones     History   Social History  . Marital Status: Married    Spouse Name: N/A    Number of Children: N/A  . Years of Education: N/A   Occupational History  . Not on file.   Social History Main Topics  . Smoking status: Former Smoker    Types: Cigarettes    Quit date: 02/24/1965  . Smokeless tobacco: Never Used  . Alcohol Use: No  . Drug Use: No  . Sexual Activity: Not on file   Other Topics Concern  . Not on file   Social History Narrative  . No narrative on file    Past Surgical History  Procedure Laterality Date  . Vasectomy    . Tonsillectomy    . Coronary artery bypass graft  01/17/2006    x3 using a left internal mammary artery graft to left anterior descending coronary artery, saphenous vein graft to obtuse marginal branch, saphenous vein graft to posterior descending branch / Endoscopic vein harvesting from the right leg.  . Coronary angioplasty with stent placement  01/09/2006    Three-vessel CAD   His right coronary artery is extremely tight.  He has moderate to severe disease in the LAD & left circumflex artery.  The LAD is very heavily calcified and the proximal and ostial LAD is not a good target for PTCA.  We will refer him to CVTS for further evaluation  . Coronary angioplasty with stent placement  02/14/2000    EF - of around 65-70%./PTCA and stenting of his mid right  coronary artery (4.0 x 18 mm NIR stent to the mid RCA).  He was noted  to have a 50% ostial stenosis at the time of his heart catheterization   . Tonsillectomy    . Tendon repair  03/11/2012    Procedure: TENDON REPAIR;  Surgeon: Toni Arthurs, MD;  Location: [facility];  Service: Orthopedics;  Laterality: Right;  Right Tibialis Tendon Repair With Gastrocnemius Recession; Possible PLANTARIS AUTOGRAFT   . Gastrocnemius recession  03/11/2012    Procedure: GASTROCNEMIUS SLIDE;  Surgeon: Toni Arthurs, MD;  Location: [facility];  Service: Orthopedics;  Laterality: Right;    Family History  Problem Relation Age of Onset  . Heart attack Father   . Stroke Mother     Allergies  Allergen Reactions  .  Cozaar   . Ace Inhibitors   . Crestor [Rosuvastatin Calcium]     Muscle aches  . Lipitor [Atorvastatin Calcium]     Muscle aches  . Lisinopril     REACTION: rash ?  . Lovastatin     Muscle aches  . Pravastatin     Muscle aches  . Zocor [Simvastatin - High Dose]     Muscle aches  . Latex Rash    Current Outpatient Prescriptions on File Prior to Visit  Medication Sig Dispense Refill  . ACCU-CHEK AVIVA PLUS test strip USE ONE EVERY DAY  100 each  12  . aspirin 81 MG tablet Take 81 mg by mouth as directed.       . carvedilol (COREG) 12.5 MG tablet Take 12.5 mg by mouth. Taking 1 tablet in AM and 1Tablet in PM      . Enoxaparin Sodium (LOVENOX IJ) Inject as directed. As directed.      . Flaxseed, Linseed, POWD Take by mouth daily.        . Lancets (ACCU-CHEK MULTICLIX) lancets 1 each by  Other route daily as needed for other.  102 each  3  . metFORMIN (GLUCOPHAGE) 1000 MG tablet Take 1,000 mg by mouth 2 (two) times daily with a meal.       . NITROSTAT 0.4 MG SL tablet DISSOLVE 1 TABLET UNDER THE TONGUE AS NEEDED FOR CHEST PAIN  25 tablet  0  . travoprost, benzalkonium, (TRAVATAN) 0.004 % ophthalmic solution 1 drop at bedtime. As directed        No current facility-administered medications on file prior to visit.    BP 142/76  Pulse 71  Temp(Src) 98.3 F (36.8 C) (Oral)  Resp 20  Wt 148 lb (67.132 kg)  SpO2 97%     Review of Systems  Constitutional: Positive for activity change, appetite change, fatigue and unexpected weight change. Negative for fever and chills.  HENT: Negative for congestion, dental problem, ear pain, hearing loss, sore throat, tinnitus, trouble swallowing and voice change.   Eyes: Negative for pain, discharge and visual disturbance.  Respiratory: Negative for cough, chest tightness, wheezing and stridor.   Cardiovascular: Negative for chest pain, palpitations and leg swelling.  Gastrointestinal: Negative for nausea, vomiting, abdominal pain, diarrhea, constipation, blood in stool and abdominal distention.  Genitourinary: Negative for urgency, hematuria, flank pain, discharge, difficulty urinating and genital sores.  Musculoskeletal: Negative for arthralgias, back pain, gait problem, joint swelling, myalgias and neck stiffness.  Skin: Negative for rash.  Neurological: Negative for dizziness, syncope, speech difficulty, weakness, numbness and headaches.  Hematological: Negative for adenopathy. Does not bruise/bleed easily.  Psychiatric/Behavioral: Negative for behavioral problems and dysphoric mood. The patient is not nervous/anxious.        Objective:   Physical Exam  Constitutional: He is oriented to person, place, and time. He appears well-developed.  HENT:  Head: Normocephalic.  Right Ear: External ear normal.  Left Ear: External ear  normal.  Eyes: Conjunctivae and EOM are normal.  Neck: Normal range of motion.  Cardiovascular: Normal rate and normal heart sounds.   Pulmonary/Chest: Breath sounds normal.  Abdominal: Bowel sounds are normal.  No organomegaly  Musculoskeletal: Normal range of motion. He exhibits no edema and no tenderness.  Neurological: He is alert and oriented to person, place, and time.  Psychiatric: He has a normal mood and affect. His behavior is normal.          Assessment & Plan:   Fatigue weight loss.  We'll check a chest x-ray and screening lab. Will consider abdominal CT scan. Fecal blood testing  Recheck 4 weeks or sooner if any clinical change Diabetes mellitus check hemoglobin A1c Coronary artery disease Hypertension

## 2013-02-04 ENCOUNTER — Ambulatory Visit: Payer: Medicare Other | Admitting: Internal Medicine

## 2013-02-04 NOTE — Telephone Encounter (Signed)
Left message on voicemail to call office.  

## 2013-02-04 NOTE — Telephone Encounter (Signed)
Pt called back told him lab results but Dr. Amador Cunas has not reviewed them he will be back on 12/22 and I will have him review them and get back to him. Pt verbalized understanding and would like copy of labs and chest x-ray once reviewed.

## 2013-02-07 ENCOUNTER — Other Ambulatory Visit (INDEPENDENT_AMBULATORY_CARE_PROVIDER_SITE_OTHER): Payer: Medicare Other

## 2013-02-07 DIAGNOSIS — D649 Anemia, unspecified: Secondary | ICD-10-CM

## 2013-02-07 LAB — HEMOCCULT GUIAC POC 1CARD (OFFICE)
Card #2 Fecal Occult Blod, POC: NEGATIVE
Fecal Occult Blood, POC: NEGATIVE

## 2013-02-22 NOTE — Telephone Encounter (Signed)
Copy of labs mailed to pt.

## 2013-02-22 NOTE — Telephone Encounter (Signed)
Labs were discussed with the patient by phone. Patient should have an appointment next month for followup

## 2013-02-22 NOTE — Telephone Encounter (Signed)
Dr. Kirtland Bouchard, please review labs done on 12/11 and advise if any recommendations.

## 2013-02-24 DIAGNOSIS — N200 Calculus of kidney: Secondary | ICD-10-CM

## 2013-02-24 HISTORY — DX: Calculus of kidney: N20.0

## 2013-03-08 ENCOUNTER — Ambulatory Visit (INDEPENDENT_AMBULATORY_CARE_PROVIDER_SITE_OTHER): Payer: Medicare Other | Admitting: Internal Medicine

## 2013-03-08 ENCOUNTER — Encounter: Payer: Self-pay | Admitting: Internal Medicine

## 2013-03-08 VITALS — BP 110/60 | HR 75 | Temp 97.4°F | Resp 18 | Ht 66.0 in | Wt 147.0 lb

## 2013-03-08 DIAGNOSIS — E119 Type 2 diabetes mellitus without complications: Secondary | ICD-10-CM

## 2013-03-08 DIAGNOSIS — I10 Essential (primary) hypertension: Secondary | ICD-10-CM

## 2013-03-08 DIAGNOSIS — I2510 Atherosclerotic heart disease of native coronary artery without angina pectoris: Secondary | ICD-10-CM

## 2013-03-08 MED ORDER — CARVEDILOL 12.5 MG PO TABS: ORAL_TABLET | ORAL | Status: DC

## 2013-03-08 NOTE — Progress Notes (Signed)
Subjective:    Patient ID: Johnny Peek., male    DOB: 10-23-1932, 80 y.o.   MRN: [id number]  HPI Wt Readings from Last 3 Encounters:  03/08/13 147 lb (66.679 kg)  02/03/13 148 lb (67.132 kg)  02/01/13 147 lb 8 oz (66.51 kg)    80 year old patient who is seen today in followup.  The patient has had some dizziness and orthostatic weakness. He fell yesterday striking his head.  He continues to be weak. He is also followed at [facility]. Apparently lower extremity venous Doppler evaluation revealed superficial phlebitis. He was treated with Lovenox for 30 days. He has type 2 diabetes and a history of weight loss. Weight more recently has been stable.  Past Medical History  Diagnosis Date  . CORONARY ARTERY DISEASE Nov 2007    CABG x3   . DIABETES MELLITUS, TYPE II   . HYPERLIPIDEMIA     Not able to take statins.  [name] HYPERTENSION   . Arthritis   . History of kidney stones     History   Social History  . Marital Status: Married    Spouse Name: N/A    Number of Children: N/A  . Years of Education: N/A   Occupational History  . Not on file.   Social History Main Topics  . Smoking status: Former Smoker    Types: Cigarettes    Quit date: 02/24/1965  . Smokeless tobacco: Never Used  . Alcohol Use: No  . Drug Use: No  . Sexual Activity: Not on file   Other Topics Concern  . Not on file   Social History Narrative  . No narrative on file    Past Surgical History  Procedure Laterality Date  . Vasectomy    . Tonsillectomy    . Coronary artery bypass graft  01/17/2006    x3 using a left internal mammary artery graft to left anterior descending coronary artery, saphenous vein graft to obtuse marginal branch, saphenous vein graft to posterior descending branch / Endoscopic vein harvesting from the right leg.  . Coronary angioplasty with stent placement  01/09/2006    Three-vessel CAD  His right coronary artery is extremely tight.  He has moderate to severe  disease in the LAD & left circumflex artery.  The LAD is very heavily calcified and the proximal and ostial LAD is not a good target for PTCA.  We will refer him to CVTS for further evaluation  . Coronary angioplasty with stent placement  02/14/2000    EF - of around 65-70%./PTCA and stenting of his mid right  coronary artery (4.0 x 18 mm NIR stent to the mid RCA).  He was noted  to have a 50% ostial stenosis at the time of his heart catheterization   . Tonsillectomy    . Tendon repair  03/11/2012    Procedure: TENDON REPAIR;  Surgeon: Wylene Simmer, MD;  Location: [facility];  Service: Orthopedics;  Laterality: Right;  Right Tibialis Tendon Repair With Gastrocnemius Recession; Possible PLANTARIS AUTOGRAFT   . Gastrocnemius recession  03/11/2012    Procedure: GASTROCNEMIUS SLIDE;  Surgeon: Wylene Simmer, MD;  Location: [facility];  Service: Orthopedics;  Laterality: Right;    Family History  Problem Relation Age of Onset  . Heart attack Father   . Stroke Mother     Allergies  Allergen Reactions  . Cozaar   . Ace Inhibitors   . Crestor [Rosuvastatin Calcium]     Muscle  aches  . Lipitor [Atorvastatin Calcium]     Muscle aches  . Lisinopril     REACTION: rash ?  . Lovastatin     Muscle aches  . Pravastatin     Muscle aches  . Zocor [Simvastatin - High Dose]     Muscle aches  . Latex Rash    Current Outpatient Prescriptions on File Prior to Visit  Medication Sig Dispense Refill  . ACCU-CHEK AVIVA PLUS test strip USE ONE EVERY DAY  100 each  12  . aspirin 81 MG tablet Take 81 mg by mouth as directed.       . Flaxseed, Linseed, POWD Take by mouth daily.        . Lancets (ACCU-CHEK MULTICLIX) lancets 1 each by Other route daily as needed for other.  102 each  3  . metFORMIN (GLUCOPHAGE) 1000 MG tablet Take 1,000 mg by mouth 2 (two) times daily with a meal.       . NITROSTAT 0.4 MG SL tablet DISSOLVE 1 TABLET UNDER THE TONGUE AS NEEDED FOR CHEST PAIN  25  tablet  0  . travoprost, benzalkonium, (TRAVATAN) 0.004 % ophthalmic solution 1 drop at bedtime. As directed        No current facility-administered medications on file prior to visit.    BP 110/60  Pulse 75  Temp(Src) 97.4 F (36.3 C) (Oral)  Resp 18  Ht 5\' 6"  (1.676 m)  Wt 147 lb (66.679 kg)  BMI 23.74 kg/m2  SpO2 98%      Review of Systems  Constitutional: Positive for activity change and fatigue. Negative for fever, chills and appetite change.  HENT: Negative for congestion, dental problem, ear pain, hearing loss, sore throat, tinnitus, trouble swallowing and voice change.   Eyes: Negative for pain, discharge and visual disturbance.  Respiratory: Negative for cough, chest tightness, wheezing and stridor.   Cardiovascular: Negative for chest pain, palpitations and leg swelling.  Gastrointestinal: Negative for nausea, vomiting, abdominal pain, diarrhea, constipation, blood in stool and abdominal distention.  Genitourinary: Negative for urgency, hematuria, flank pain, discharge, difficulty urinating and genital sores.  Musculoskeletal: Negative for arthralgias, back pain, gait problem, joint swelling, myalgias and neck stiffness.  Skin: Negative for rash.  Neurological: Positive for dizziness. Negative for syncope, speech difficulty, weakness, numbness and headaches.  Hematological: Negative for adenopathy. Does not bruise/bleed easily.  Psychiatric/Behavioral: Negative for behavioral problems and dysphoric mood. The patient is not nervous/anxious.        Objective:   Physical Exam  Constitutional: He is oriented to person, place, and time. He appears well-developed.  Blood pressure 110/60 sitting and standing  HENT:  Head: Normocephalic.  Right Ear: External ear normal.  Left Ear: External ear normal.  Eyes: Conjunctivae and EOM are normal.  Neck: Normal range of motion.  Cardiovascular: Normal rate and normal heart sounds.   Pulmonary/Chest: Breath sounds normal.    Abdominal: Bowel sounds are normal.  Musculoskeletal: Normal range of motion. He exhibits edema. He exhibits no tenderness.  Slight edema distal to the left knee  Neurological: He is alert and oriented to person, place, and time.  Psychiatric: He has a normal mood and affect. His behavior is normal.          Assessment & Plan:   Hypotension. We'll decrease Coreg to 6.25 mg twice daily. Recheck one month  Diabetes mellitus stable History weight loss. He appears to have stabilized

## 2013-03-08 NOTE — Progress Notes (Signed)
Pre-visit discussion using our clinic review tool. No additional management support is needed unless otherwise documented below in the visit note.  

## 2013-03-08 NOTE — Patient Instructions (Signed)
Please check your blood pressure on a regular basis.  If it is consistently greater than 150/90, please make an office appointment.  Return in one month for follow-up

## 2013-03-09 ENCOUNTER — Telehealth: Payer: Self-pay

## 2013-03-09 NOTE — Telephone Encounter (Signed)
Relevant patient education mailed to patient.  

## 2013-03-29 ENCOUNTER — Telehealth: Payer: Self-pay | Admitting: Internal Medicine

## 2013-03-29 NOTE — Telephone Encounter (Signed)
Relevant patient education mailed to patient.  

## 2013-04-05 ENCOUNTER — Ambulatory Visit: Payer: Medicare Other | Admitting: Internal Medicine

## 2013-07-01 ENCOUNTER — Emergency Department (HOSPITAL_COMMUNITY): Payer: Medicare Other

## 2013-07-01 ENCOUNTER — Encounter (HOSPITAL_COMMUNITY): Payer: Self-pay | Admitting: Emergency Medicine

## 2013-07-01 ENCOUNTER — Emergency Department (HOSPITAL_COMMUNITY)
Admission: EM | Admit: 2013-07-01 | Discharge: 2013-07-02 | Payer: Medicare Other | Attending: Emergency Medicine | Admitting: Emergency Medicine

## 2013-07-01 DIAGNOSIS — Y9389 Activity, other specified: Secondary | ICD-10-CM | POA: Insufficient documentation

## 2013-07-01 DIAGNOSIS — I2510 Atherosclerotic heart disease of native coronary artery without angina pectoris: Secondary | ICD-10-CM | POA: Insufficient documentation

## 2013-07-01 DIAGNOSIS — Z79899 Other long term (current) drug therapy: Secondary | ICD-10-CM | POA: Insufficient documentation

## 2013-07-01 DIAGNOSIS — E119 Type 2 diabetes mellitus without complications: Secondary | ICD-10-CM | POA: Insufficient documentation

## 2013-07-01 DIAGNOSIS — E785 Hyperlipidemia, unspecified: Secondary | ICD-10-CM | POA: Insufficient documentation

## 2013-07-01 DIAGNOSIS — N9989 Other postprocedural complications and disorders of genitourinary system: Secondary | ICD-10-CM | POA: Insufficient documentation

## 2013-07-01 DIAGNOSIS — W010XXA Fall on same level from slipping, tripping and stumbling without subsequent striking against object, initial encounter: Secondary | ICD-10-CM | POA: Insufficient documentation

## 2013-07-01 DIAGNOSIS — Z87442 Personal history of urinary calculi: Secondary | ICD-10-CM | POA: Insufficient documentation

## 2013-07-01 DIAGNOSIS — K661 Hemoperitoneum: Secondary | ICD-10-CM

## 2013-07-01 DIAGNOSIS — Z7982 Long term (current) use of aspirin: Secondary | ICD-10-CM | POA: Insufficient documentation

## 2013-07-01 DIAGNOSIS — Z9861 Coronary angioplasty status: Secondary | ICD-10-CM | POA: Insufficient documentation

## 2013-07-01 DIAGNOSIS — I10 Essential (primary) hypertension: Secondary | ICD-10-CM | POA: Insufficient documentation

## 2013-07-01 DIAGNOSIS — Z91040 Latex allergy status: Secondary | ICD-10-CM | POA: Insufficient documentation

## 2013-07-01 DIAGNOSIS — Y833 Surgical operation with formation of external stoma as the cause of abnormal reaction of the patient, or of later complication, without mention of misadventure at the time of the procedure: Secondary | ICD-10-CM | POA: Insufficient documentation

## 2013-07-01 DIAGNOSIS — M129 Arthropathy, unspecified: Secondary | ICD-10-CM | POA: Insufficient documentation

## 2013-07-01 DIAGNOSIS — Y92009 Unspecified place in unspecified non-institutional (private) residence as the place of occurrence of the external cause: Secondary | ICD-10-CM | POA: Insufficient documentation

## 2013-07-01 DIAGNOSIS — S36899A Unspecified injury of other intra-abdominal organs, initial encounter: Secondary | ICD-10-CM | POA: Insufficient documentation

## 2013-07-01 DIAGNOSIS — IMO0002 Reserved for concepts with insufficient information to code with codable children: Secondary | ICD-10-CM | POA: Insufficient documentation

## 2013-07-01 DIAGNOSIS — N99528 Other complication of other external stoma of urinary tract: Secondary | ICD-10-CM

## 2013-07-01 DIAGNOSIS — Z87891 Personal history of nicotine dependence: Secondary | ICD-10-CM | POA: Insufficient documentation

## 2013-07-01 LAB — BASIC METABOLIC PANEL
BUN: 15 mg/dL (ref 6–23)
CO2: 19 mEq/L (ref 19–32)
Calcium: 9.4 mg/dL (ref 8.4–10.5)
Chloride: 97 mEq/L (ref 96–112)
Creatinine, Ser: 1.14 mg/dL (ref 0.50–1.35)
GFR calc Af Amer: 69 mL/min — ABNORMAL LOW (ref >90)
GFR, EST NON AFRICAN AMERICAN: 59 mL/min — AB (ref >90)
GLUCOSE: 155 mg/dL — AB (ref 70–99)
POTASSIUM: 4.6 meq/L (ref 3.7–5.3)
Sodium: 129 mEq/L — ABNORMAL LOW (ref 137–147)

## 2013-07-01 LAB — CBC
HCT: 32.7 % — ABNORMAL LOW (ref 39.0–52.0)
HEMOGLOBIN: 11.3 g/dL — AB (ref 13.0–17.0)
MCH: 30.1 pg (ref 26.0–34.0)
MCHC: 34.6 g/dL (ref 30.0–36.0)
MCV: 87.2 fL (ref 78.0–100.0)
Platelets: 275 10*3/uL (ref 150–400)
RBC: 3.75 MIL/uL — ABNORMAL LOW (ref 4.22–5.81)
RDW: 14.2 % (ref 11.5–15.5)
WBC: 15.1 10*3/uL — ABNORMAL HIGH (ref 4.0–10.5)

## 2013-07-01 LAB — PROTIME-INR
INR: 1.09 (ref 0.00–1.49)
Prothrombin Time: 13.9 seconds (ref 11.6–15.2)

## 2013-07-01 MED ORDER — IOHEXOL 300 MG/ML  SOLN
100.0000 mL | Freq: Once | INTRAMUSCULAR | Status: AC | PRN
  Administered 2013-07-01: 100 mL via INTRAVENOUS

## 2013-07-01 NOTE — ED Notes (Signed)
Bed: WA21 Expected date:  Expected time:  Means of arrival:  Comments: EMS M bleeding? Released from [place] today

## 2013-07-01 NOTE — ED Notes (Signed)
Patient comes in via EMS - the patient reports that he is bleeding around his newly changed nephrostomy tube. The patient is bleeding around the site where the sutures are and no output through the tub to the drainage bag. Large amount of blood clots in the tube and unable to pull any fluid back. The patient is alert and oriented, denies any other symptoms.

## 2013-07-01 NOTE — ED Provider Notes (Signed)
CSN: [id number]     Arrival date & time 07/01/13  2112 History   First MD Initiated Contact with Patient 07/01/13 2128     Chief Complaint  Patient presents with  . Hematuria     (Consider location/radiation/quality/duration/timing/severity/associated sxs/prior Treatment) HPI Comments: Flank bleeding for past hour. Just released from [facility] after Nephrostomy tube change (smaller to large tube).  Patient is a 80 y.o. male presenting with flank pain. The history is provided by the patient.  Flank Pain This is a recurrent problem. The current episode started less than 1 hour ago. The problem occurs constantly. The problem has not changed since onset.Pertinent negatives include no chest pain, no abdominal pain and no shortness of breath. Nothing aggravates the symptoms. Nothing relieves the symptoms.    Past Medical History  Diagnosis Date  . CORONARY ARTERY DISEASE Nov 2007    CABG x3   . DIABETES MELLITUS, TYPE II   . HYPERLIPIDEMIA     Not able to take statins.  [name] HYPERTENSION   . Arthritis   . History of kidney stones    Past Surgical History  Procedure Laterality Date  . Vasectomy    . Tonsillectomy    . Coronary artery bypass graft  01/17/2006    x3 using a left internal mammary artery graft to left anterior descending coronary artery, saphenous vein graft to obtuse marginal branch, saphenous vein graft to posterior descending branch / Endoscopic vein harvesting from the right leg.  . Coronary angioplasty with stent placement  01/09/2006    Three-vessel CAD  His right coronary artery is extremely tight.  He has moderate to severe disease in the LAD & left circumflex artery.  The LAD is very heavily calcified and the proximal and ostial LAD is not a good target for PTCA.  We will refer him to CVTS for further evaluation  . Coronary angioplasty with stent placement  02/14/2000    EF - of around 65-70%./PTCA and stenting of his mid right  coronary artery (4.0 x 18 mm NIR stent  to the mid RCA).  He was noted  to have a 50% ostial stenosis at the time of his heart catheterization   . Tonsillectomy    . Tendon repair  03/11/2012    Procedure: TENDON REPAIR;  Surgeon: Wylene Simmer, MD;  Location: [facility];  Service: Orthopedics;  Laterality: Right;  Right Tibialis Tendon Repair With Gastrocnemius Recession; Possible PLANTARIS AUTOGRAFT   . Gastrocnemius recession  03/11/2012    Procedure: GASTROCNEMIUS SLIDE;  Surgeon: Wylene Simmer, MD;  Location: [facility];  Service: Orthopedics;  Laterality: Right;   Family History  Problem Relation Age of Onset  . Heart attack Father   . Stroke Mother    History  Substance Use Topics  . Smoking status: Former Smoker    Types: Cigarettes    Quit date: 02/24/1965  . Smokeless tobacco: Never Used  . Alcohol Use: No    Review of Systems  Constitutional: Negative for fever and chills.  Respiratory: Negative for cough and shortness of breath.   Cardiovascular: Negative for chest pain.  Gastrointestinal: Negative for nausea, vomiting, abdominal pain and diarrhea.  Genitourinary: Positive for flank pain.  All other systems reviewed and are negative.     Allergies  Cozaar; Ace inhibitors; Crestor; Lipitor; Lisinopril; Lovastatin; Pravastatin; Zocor; and Latex  Home Medications   Prior to Admission medications   Medication Sig Start Date End Date Taking? Authorizing Provider  aspirin 81  MG tablet Take 81 mg by mouth daily.    Yes Historical Provider, MD  carvedilol (COREG) 12.5 MG tablet Take 6.25 mg by mouth 2 (two) times daily with a meal.   Yes Historical Provider, MD  Flaxseed, Linseed, POWD Take by mouth daily.     Yes Historical Provider, MD  metFORMIN (GLUCOPHAGE) 500 MG tablet Take 500 mg by mouth daily with breakfast.   Yes Historical Provider, MD  nitroGLYCERIN (NITROSTAT) 0.4 MG SL tablet Place 0.4 mg under the tongue every 5 (five) minutes as needed for chest pain.   Yes Historical  Provider, MD  travoprost, benzalkonium, (TRAVATAN) 0.004 % ophthalmic solution Place 1 drop into both eyes at bedtime.    Yes Historical Provider, MD   BP 146/68  Pulse 99  Temp(Src) 98.2 F (36.8 C) (Oral)  Resp 22  SpO2 98% Physical Exam  Nursing note and vitals reviewed. Constitutional: He is oriented to person, place, and time. He appears well-developed and well-nourished. No distress.  HENT:  Head: Normocephalic and atraumatic.  Mouth/Throat: Oropharynx is clear and moist. No oropharyngeal exudate.  Eyes: EOM are normal. Pupils are equal, round, and reactive to light. Right eye exhibits no discharge. Left eye exhibits no discharge.  Neck: Normal range of motion. Neck supple.  Cardiovascular: Normal rate and regular rhythm.  Exam reveals no friction rub.   No murmur heard. Pulmonary/Chest: Effort normal and breath sounds normal. No respiratory distress. He has no wheezes. He has no rales.  Abdominal: Soft. He exhibits no distension. There is tenderness (mild L flank pain on palpation. ). There is no rebound.  L flank nephrostomy - 68 French - no bleeding at the site  Musculoskeletal: Normal range of motion. He exhibits no edema.  Neurological: He is alert and oriented to person, place, and time. No cranial nerve deficit. He exhibits normal muscle tone.  Skin: No rash noted. He is not diaphoretic.    ED Course  Procedures (including critical care time) Labs Review Labs Reviewed  CBC - Abnormal; Notable for the following:    WBC 15.1 (*)    RBC 3.75 (*)    Hemoglobin 11.3 (*)    HCT 32.7 (*)    All other components within normal limits  PROTIME-INR  BASIC METABOLIC PANEL    Imaging Review Ct Abdomen Pelvis W Contrast  07/01/2013   CLINICAL DATA:  Bleeding around nephrostomy tube. Hematuria. HEMATURIA IV contrast only  EXAM: CT ABDOMEN AND PELVIS WITH CONTRAST  TECHNIQUE: Multidetector CT imaging of the abdomen and pelvis was performed using the standard protocol following  bolus administration of intravenous contrast.  CONTRAST:  139mL OMNIPAQUE IOHEXOL 300 MG/ML  SOLN  COMPARISON:  None.  FINDINGS: Bones: Sclerosis of both femoral heads is favored to be degenerative rather than AVN. Lumbar spondylosis. No aggressive osseous lesions. Bilateral hip osteoarthritis. Anterolisthesis of L4 on L5. Sclerotic lesion in the right anterior superior iliac spine probably represents a bone island. Consider correlation with PSA. Right femoral neck fracture, probably healed with residual cortical irregularity.  Lung Bases: Small bilateral pleural effusions, left-greater-than-right. Left-greater-than-right basilar atelectasis. Median sternotomy, presumably for CABG. Coronary artery atherosclerosis is present.  Liver:  Old granulomatous disease.  Spleen:  Normal.  Gallbladder:  Calcified gallstones.  Common bile duct:  Normal.  Pancreas:  Atrophic.  Adrenal glands: Thickening of the left adrenal gland, most compatible with hyperplasia.  Kidneys: Normal enhancement of the right kidney with normal delayed excretion of contrast. Normal opacification of the proximal right ureter.  No right ureteral stone.  The left kidney shows a nephrostomy tube with hematoma in the left retroperitoneum measuring 8 cm x 4 cm on axial imaging. There is gas within the hematoma which is probably postprocedural however superimposed infection cannot be excluded. The nephrostomy tube terminates in the left renal pelvis. There is a double-J left ureteral stent which terminates in the urinary bladder. Foley catheter is also noted in the urinary bladder. There is a delayed nephrogram on the left with patchy delayed enhancement. Left ureteral stent proximal loop is not reconstituted in is present in the proximal left ureter. Gas is present in the retroperitoneum along the proximal left ureter.  Left kidney shows nonobstructing calculi within the collecting system. There is a small stone near the UPJ. This may be outside of the  ureter and ureteral laceration/tear cannot be excluded.  Stomach: Patulous gastroesophageal junction with small hiatal hernia. No inflammatory changes of the stomach.  Small bowel: The duodenum appears within normal limits. Small uncomplicated duodenal diverticulum. No small bowel obstruction.  Colon: Colonic diverticulosis. Anterior displacement of the descending colon by retroperitoneal hematoma.  Pelvic Genitourinary: Urinary bladder is collapsed. Prostatomegaly. Foley catheter in the urinary bladder. Mural thickening of the urinary bladder. Retroperitoneal blood tracks into the left anatomic pelvis.  Vasculature: Atherosclerosis.  Body Wall: Stranding is present around the left nephrostomy tube. This extends into the subcutaneous fat. Mild anasarca changes.  IMPRESSION: 1. Left nephrostomy tube with left retroperitoneal hematoma. Small amount of gas in the hematoma makes superimposed infection impossible to exclude. No discrete abscess. 2. Delayed left nephrogram with striated delayed nephrogram. The cause for lack of excretion is unclear. No contrast is being excreted through the left nephrostomy tube. 3. Nonobstructing left renal collecting system calculi. 4. Calculus anterior to the proximal left ureter. This raises the possibility of ureteral perforation with a stone in the retroperitoneum. 5. Left ureteral stent proximal loop is not reconstituted and the loop is in the mid to proximal left ureter. Gas tracks along the ureter at the loop again raising the possibility of ureteral laceration or transection. 6. Right subcapital femoral neck fracture, probably old. 7. Other incidental findings noted above.   Electronically Signed   By: Dereck Ligas M.D.   On: 07/01/2013 23:56     EKG Interpretation None      MDM   Final diagnoses:  Retroperitoneal hematoma  Nephrostomy complication    80 year old male presents with bleeding from his left nephrostomy tube site. Patient was released from the [facility]  hospital today, 2 days postop from a left nephrostomy tube exchange, going from a smaller tube to a large bore, 22 [place], left-sided nephrostomy tube. He slipped and fell at home onto concrete landing on his right side. An hour later he noticed blood coming from the nephrostomy tube site on the left. EMS noticed lots of blood and had multiple ABD pads soaked with blood. He's had 2 months of multiple urologic admissions. He was recently at [facility] prior to going to the [place] for a left-sided nephrostomy tube insertion for a left proximal ureter obstruction. Currently he has a Foley in his penis and his left nephrostomy tube. He's had mild hematuria from both, but both worsened with her hematuria since the bleeding began coming from the left nephrostomy tube site. He denies any fever, nausea, vomiting, diarrhea, abdominal pain. He has mild left flank pain. Is not currently on any blood thinners. On arrival, no bleeding from the skin site of the left nephrostomy tube.  He does have bright red blood in his left nephrostomy to bag and his penile catheter bag. We removed a lot of clot from the connection of his left nephrostomy tube to the draining bag. We cannot flush his left nephrostomy tubU  whatsoever. It is not draining at this time. I am concerned he is clotted off his left nephrostomy tube. I spoke with radiology who recommended a CT with IV contrast for evaluation of his left kidney and collecting system. Labs show hemoglobin of 11.3. Urology here states he should go to [facility] since his care initiated there. Urology at [facility] requests medical admission since he doesn't have any acute surgical needs. Dr. Madalyn Rob with the hospitalist service will admit.  I have reviewed all labs and imaging and considered them in my medical decision making.   Osvaldo Shipper, MD 07/02/13 [phone]

## 2013-07-02 MED ORDER — PIPERACILLIN-TAZOBACTAM 3.375 G IVPB 30 MIN
3.3750 g | Freq: Once | INTRAVENOUS | Status: AC
  Administered 2013-07-02: 3.375 g via INTRAVENOUS
  Filled 2013-07-02: qty 50

## 2013-07-02 MED ORDER — OXYCODONE-ACETAMINOPHEN 5-325 MG PO TABS
1.0000 | ORAL_TABLET | Freq: Once | ORAL | Status: AC
  Administered 2013-07-02: 1 via ORAL
  Filled 2013-07-02: qty 1

## 2013-08-02 ENCOUNTER — Ambulatory Visit (INDEPENDENT_AMBULATORY_CARE_PROVIDER_SITE_OTHER): Payer: Medicare Other | Admitting: Cardiovascular Disease

## 2013-08-02 ENCOUNTER — Encounter: Payer: Self-pay | Admitting: Cardiovascular Disease

## 2013-08-02 VITALS — BP 148/60 | HR 77 | Ht 66.0 in | Wt 142.5 lb

## 2013-08-02 DIAGNOSIS — I2510 Atherosclerotic heart disease of native coronary artery without angina pectoris: Secondary | ICD-10-CM

## 2013-08-02 NOTE — Progress Notes (Signed)
Johnny [name]. Date of Birth  March 04, 1932 [facility] [address]    [place], [place]  [address] [phone]  Fax  [phone]  Problem list: 1. Coronary artery disease-status post CABG in 2007 2. Diabetes mellitus 3. Hyperlipidemia 4. Hypertension  History of Present Illness:  80 yo gentleman with a hx of CAD - s/p CABG, HTN,   He's had some recent episodes of dizziness. His blood pressure has also been elevated.  He felt poorly. He denies any chest pain or shortness breath.  He had lots of diaphoresis.  He called me last week complaining of not feeling well. It was difficult for him to describe exactly what his symptoms were but he did not describe any chest pain or shortness breath. He did have the sensation of flushing and also had profound diaphoresis. His blood pressure was noted to be elevated. He was seen by Johnny [name] here in the office. She increase his carvedilol and he presents today for further evaluation.  He 's feeling somewhat fatigued today.  He complains of left arm and left leg numbness.  He was walking up until 4-5 weeks ago ( 50 minutes a day 5-6 days a week without problems)  He has restarted his Aspirin and his coreg.  He has not had any angina.    August 25, 2012:  Johnny [name] ruptured a tendon in his right leg and is still having problems.  No CP   Dec. 9, 2014: Johnny [name] was found to have a superficial DVT. He's been on Lovenox for the past 7 days.   He has had a kidney stone.   He also has had a ruptured tendon in his right leg earlier this year.    He has not as active.  His wife states that he is tired.    He has lost 40 lbs this past 6 months.   I suspect that these 2 complaints are related.   He has chills, no fevers, no sweats. He is eating OK - he quit drinking sodas which may be contributing to this weight loss. His BP is high here but his wife states that his BP is normal and even low at home.    August 02, 2013:  Johnny [name] has continued to have lots of  issues - kidney stones, fracture of hip,    .  No cardiac issues.   He's been going to the [facility]. He's had an echocardiogram which revealed normal left and her systolic function with an EF of 55%. He had mild aortic sclerosis. He has trace mitral regurgitation and mild tricuspid regurgitation.  He also has had a pharmacological stress Myoview study at the [place] which was unremarkable.    Current Outpatient Prescriptions on File Prior to Visit  Medication Sig Dispense Refill  . Flaxseed, Linseed, POWD Take by mouth daily.        . metFORMIN (GLUCOPHAGE) 500 MG tablet Take 500 mg by mouth 2 (two) times daily with a meal.       . nitroGLYCERIN (NITROSTAT) 0.4 MG SL tablet Place 0.4 mg under the tongue every 5 (five) minutes as needed for chest pain.      [name] travoprost, benzalkonium, (TRAVATAN) 0.004 % ophthalmic solution Place 1 drop into both eyes at bedtime.        No current facility-administered medications on file prior to visit.    Allergies  Allergen Reactions  . Cozaar   . Ace Inhibitors   . Crestor [employer]  Calcium]     Muscle aches  . Lipitor [Atorvastatin Calcium]     Muscle aches  . Lisinopril     REACTION: rash ?  . Lovastatin     Muscle aches  . Pravastatin     Muscle aches  . Zocor [Simvastatin - High Dose]     Muscle aches  . Latex Rash    Past Medical History  Diagnosis Date  . CORONARY ARTERY DISEASE Nov 2007    CABG x3   . DIABETES MELLITUS, TYPE II   . HYPERLIPIDEMIA     Not able to take statins.  [name] HYPERTENSION   . Arthritis   . History of kidney stones   . Hip fracture, right   . Kidney stone 2015    Past Surgical History  Procedure Laterality Date  . Vasectomy    . Tonsillectomy    . Coronary artery bypass graft  01/17/2006    x3 using a left internal mammary artery graft to left anterior descending coronary artery, saphenous vein graft to obtuse marginal branch, saphenous vein graft to posterior descending branch / Endoscopic  vein harvesting from the right leg.  . Coronary angioplasty with stent placement  01/09/2006    Three-vessel CAD  His right coronary artery is extremely tight.  He has moderate to severe disease in the LAD & left circumflex artery.  The LAD is very heavily calcified and the proximal and ostial LAD is not a good target for PTCA.  We will refer him to CVTS for further evaluation  . Coronary angioplasty with stent placement  02/14/2000    EF - of around 65-70%./PTCA and stenting of his mid right  coronary artery (4.0 x 18 mm NIR stent to the mid RCA).  He was noted  to have a 50% ostial stenosis at the time of his heart catheterization   . Tonsillectomy    . Tendon repair  03/11/2012    Procedure: TENDON REPAIR;  Surgeon: Wylene Simmer, MD;  Location: [place];  Service: Orthopedics;  Laterality: Right;  Right Tibialis Tendon Repair With Gastrocnemius Recession; Possible PLANTARIS AUTOGRAFT   . Gastrocnemius recession  03/11/2012    Procedure: GASTROCNEMIUS SLIDE;  Surgeon: Wylene Simmer, MD;  Location: [place];  Service: Orthopedics;  Laterality: Right;  . Hip fracture surgery      right    History  Smoking status  . Former Smoker  . Types: Cigarettes  . Quit date: 02/24/1965  Smokeless tobacco  . Never Used    History  Alcohol Use No    Family History  Problem Relation Age of Onset  . Heart attack Father   . Stroke Mother     Reviw of Systems:  Reviewed in the HPI.  All other systems are negative.  Physical Exam: BP 148/60  Pulse 77  Ht 5\' 6"  (1.676 m)  Wt 142 lb 8 oz (64.638 kg)  BMI 23.01 kg/m2   The patient is alert and oriented x 3.  The mood and affect are normal.   Skin: warm and dry.  Color is normal.    HEENT:   the sclera are nonicteric.  The mucous membranes are moist.  The carotids are 2+ without bruits.  There is no thyromegaly.  There is no JVD.    Lungs: clear.  The chest wall is non tender.    Heart: regular rate with a  normal S1 and S2.  There are no murmurs, gallops, or rubs. The PMI  is not displaced.     Abdomen: good bowel sounds.  There is no guarding or rebound.  There is no hepatosplenomegaly or tenderness.  There are no masses.   Extremities:  no clubbing, cyanosis, or edema.  The legs are without rashes.  The distal pulses are intact.   Neuro:  Cranial nerves II - XII are intact.  Motor and sensory functions are intact.    The gait is normal.  ECG: August 02, 2013:  Normal sinus rhythm  with PVCs.  Assessment / Plan:

## 2013-08-02 NOTE — Patient Instructions (Signed)
Your physician recommends that you continue on your current medications as directed. Please refer to the Current Medication list given to you today.  Your physician wants you to follow-up in: 6 months. You will receive a reminder letter in the mail two months in advance. If you don't receive a letter, please call our office to schedule the follow-up appointment.  

## 2013-08-02 NOTE — Assessment & Plan Note (Signed)
Johnny [name] is doing well from a cardiac standpoint.   He continues to have lots of other medical issues including a hip fracture, anemia, and generalized weakness. In addition, he's lost about 50 pounds over the past year. His weight loss has not been explained. He's had a colonoscopy.  He's had an echocardiogram which did not reveal any evidence of endocarditis. His left ventricle systolic function is normal. He's had a stress Myoview study which is normal.  My standpoint we will continue the same medications. I've encouraged him to try get out and exercise as much as possible. He still covering from his hip fracture.

## 2013-08-03 ENCOUNTER — Telehealth: Payer: Self-pay

## 2013-08-03 ENCOUNTER — Encounter: Payer: Self-pay | Admitting: Cardiovascular Disease

## 2013-08-03 NOTE — Telephone Encounter (Signed)
Pt states he is going to fax a report from the [place], and wants to talk to you about it. Please call.

## 2013-08-04 NOTE — Telephone Encounter (Signed)
Patient wanted to know if he should send his stress test results from the [place] I said that would be fine and gave him the fax number

## 2014-01-27 ENCOUNTER — Encounter: Payer: Self-pay | Admitting: Cardiovascular Disease

## 2014-01-27 ENCOUNTER — Ambulatory Visit (INDEPENDENT_AMBULATORY_CARE_PROVIDER_SITE_OTHER): Payer: Medicare Other | Admitting: Cardiovascular Disease

## 2014-01-27 VITALS — BP 130/70 | HR 56 | Ht 66.0 in | Wt 151.2 lb

## 2014-01-27 DIAGNOSIS — E785 Hyperlipidemia, unspecified: Secondary | ICD-10-CM

## 2014-01-27 DIAGNOSIS — I10 Essential (primary) hypertension: Secondary | ICD-10-CM

## 2014-01-27 DIAGNOSIS — I25709 Atherosclerosis of coronary artery bypass graft(s), unspecified, with unspecified angina pectoris: Secondary | ICD-10-CM

## 2014-01-27 NOTE — Assessment & Plan Note (Signed)
Stable, Will check labs at his next visit

## 2014-01-27 NOTE — Assessment & Plan Note (Signed)
Stable, no angia

## 2014-01-27 NOTE — Progress Notes (Signed)
Johnny [name]. Date of Birth  March 04, 1932 [facility] [address]    [place], [place]  [address] [phone]  Fax  [phone]  Problem list: 1. Coronary artery disease-status post CABG in 2007 2. Diabetes mellitus 3. Hyperlipidemia 4. Hypertension  History of Present Illness:  80 yo gentleman with a hx of CAD - s/p CABG, HTN,   He's had some recent episodes of dizziness. His blood pressure has also been elevated.  He felt poorly. He denies any chest pain or shortness breath.  He had lots of diaphoresis.  He called me last week complaining of not feeling well. It was difficult for him to describe exactly what his symptoms were but he did not describe any chest pain or shortness breath. He did have the sensation of flushing and also had profound diaphoresis. His blood pressure was noted to be elevated. He was seen by [name] here in the office. She increase his carvedilol and he presents today for further evaluation.  He 's feeling somewhat fatigued today.  He complains of left arm and left leg numbness.  He was walking up until 4-5 weeks ago ( 50 minutes a day 5-6 days a week without problems)  He has restarted his Aspirin and his coreg.  He has not had any angina.    August 25, 2012:  Johnny [name] ruptured a tendon in his right leg and is still having problems.  No CP   Dec. 9, 2014: Johnny [name] was found to have a superficial DVT. He's been on Lovenox for the past 7 days.   He has had a kidney stone.   He also has had a ruptured tendon in his right leg earlier this year.    He has not as active.  His wife states that he is tired.    He has lost 40 lbs this past 6 months.   I suspect that these 2 complaints are related.   He has chills, no fevers, no sweats. He is eating OK - he quit drinking sodas which may be contributing to this weight loss. His BP is high here but his wife states that his BP is normal and even low at home.    August 02, 2013:  Johnny [name] has continued to have lots of  issues - kidney stones, fracture of hip,    .  No cardiac issues.   He's been going to the [facility]. He's had an echocardiogram which revealed normal left and her systolic function with an EF of 55%. He had mild aortic sclerosis. He has trace mitral regurgitation and mild tricuspid regurgitation.  He also has had a pharmacological stress Myoview study at the [place] which was unremarkable.   Dec. 4, 2015:   Johnny [name] is a 80 y.o. man who I seen for CAD and HTN.  He also has a history of hyperlipidemia and diabetes mellitus. Everything seems to be going much better. Walking daily on a treadmill .  Feels well on the treadmill.  Had a carotid duplex that showed mild irreg.     Current Outpatient Prescriptions on File Prior to Visit  Medication Sig Dispense Refill  . carvedilol (COREG) 6.25 MG tablet Take 6.25 mg by mouth 2 (two) times daily with a meal.    . ferrous sulfate (IRON SUPPLEMENT) 325 (65 FE) MG tablet Take 325 mg by mouth daily with breakfast.    . Flaxseed, Linseed, POWD Take by mouth daily.      . metFORMIN (GLUCOPHAGE)  500 MG tablet Take 500 mg by mouth 2 (two) times daily with a meal.     . nitroGLYCERIN (NITROSTAT) 0.4 MG SL tablet Place 0.4 mg under the tongue every 5 (five) minutes as needed for chest pain.    [name] travoprost, benzalkonium, (TRAVATAN) 0.004 % ophthalmic solution Place 1 drop into both eyes at bedtime.      No current facility-administered medications on file prior to visit.    Allergies  Allergen Reactions  . Cozaar   . Ace Inhibitors   . Crestor [Rosuvastatin Calcium]     Muscle aches  . Lipitor [Atorvastatin Calcium]     Muscle aches  . Lisinopril     REACTION: rash ?  . Lovastatin     Muscle aches  . Pravastatin     Muscle aches  . Zocor [Simvastatin - High Dose]     Muscle aches  . Latex Rash    Past Medical History  Diagnosis Date  . CORONARY ARTERY DISEASE Nov 2007    CABG x3   . DIABETES MELLITUS, TYPE II   . HYPERLIPIDEMIA      Not able to take statins.  [name] HYPERTENSION   . Arthritis   . History of kidney stones   . Hip fracture, right   . Kidney stone 2015    Past Surgical History  Procedure Laterality Date  . Vasectomy    . Tonsillectomy    . Coronary artery bypass graft  01/17/2006    x3 using a left internal mammary artery graft to left anterior descending coronary artery, saphenous vein graft to obtuse marginal branch, saphenous vein graft to posterior descending branch / Endoscopic vein harvesting from the right leg.  . Coronary angioplasty with stent placement  01/09/2006    Three-vessel CAD  His right coronary artery is extremely tight.  He has moderate to severe disease in the LAD & left circumflex artery.  The LAD is very heavily calcified and the proximal and ostial LAD is not a good target for PTCA.  We will refer him to CVTS for further evaluation  . Coronary angioplasty with stent placement  02/14/2000    EF - of around 65-70%./PTCA and stenting of his mid right  coronary artery (4.0 x 18 mm NIR stent to the mid RCA).  He was noted  to have a 50% ostial stenosis at the time of his heart catheterization   . Tonsillectomy    . Tendon repair  03/11/2012    Procedure: TENDON REPAIR;  Surgeon: Wylene Simmer, MD;  Location: [place];  Service: Orthopedics;  Laterality: Right;  Right Tibialis Tendon Repair With Gastrocnemius Recession; Possible PLANTARIS AUTOGRAFT   . Gastrocnemius recession  03/11/2012    Procedure: GASTROCNEMIUS SLIDE;  Surgeon: Wylene Simmer, MD;  Location: [place];  Service: Orthopedics;  Laterality: Right;  . Hip fracture surgery      right    History  Smoking status  . Former Smoker  . Types: Cigarettes  . Quit date: 02/24/1965  Smokeless tobacco  . Never Used    History  Alcohol Use No    Family History  Problem Relation Age of Onset  . Heart attack Father   . Stroke Mother     Reviw of Systems:  Reviewed in the HPI.  All other systems  are negative.  Physical Exam: BP 170/68 mmHg  Pulse 56  Ht 5\' 6"  (1.676 m)  Wt 151 lb 4 oz (68.607 kg)  BMI 24.42  kg/m2   The patient is alert and oriented x 3.  The mood and affect are normal.   Skin: warm and dry.  Color is normal.    HEENT:   the sclera are nonicteric.  The mucous membranes are moist.  The carotids are 2+ without bruits.  There is no thyromegaly.  There is no JVD.    Lungs: clear.  The chest wall is non tender.    Heart: regular rate with a normal S1 and S2.  There are no murmurs, gallops, or rubs. The PMI is not displaced.     Abdomen: good bowel sounds.  There is no guarding or rebound.  There is no hepatosplenomegaly or tenderness.  There are no masses.   Extremities:  no clubbing, cyanosis, or edema.  The legs are without rashes.  The distal pulses are intact.   Neuro:  Cranial nerves II - XII are intact.  Motor and sensory functions are intact.    The gait is normal.  ECG: 01/27/2014: Sinus pain cardiac 56. No ST or T wave changes.  Assessment / Plan:

## 2014-01-27 NOTE — Patient Instructions (Addendum)
Your physician recommends that you continue on your current medications as directed. Please refer to the Current Medication list given to you today.  Your physician wants you to follow-up in: 6 months.  You will receive a reminder letter in the mail two months in advance. If you don't receive a letter, please call our office to schedule the follow-up appointment. We will draw fasting labs at that time:  BMET, lipid, liver

## 2014-01-27 NOTE — Assessment & Plan Note (Signed)
BP is ok 

## 2014-07-10 ENCOUNTER — Other Ambulatory Visit (INDEPENDENT_AMBULATORY_CARE_PROVIDER_SITE_OTHER): Payer: Medicare Other | Admitting: *Deleted

## 2014-07-10 DIAGNOSIS — I10 Essential (primary) hypertension: Secondary | ICD-10-CM

## 2014-07-10 DIAGNOSIS — E785 Hyperlipidemia, unspecified: Secondary | ICD-10-CM

## 2014-07-10 DIAGNOSIS — I25709 Atherosclerosis of coronary artery bypass graft(s), unspecified, with unspecified angina pectoris: Secondary | ICD-10-CM

## 2014-07-11 ENCOUNTER — Telehealth: Payer: Self-pay | Admitting: Cardiovascular Disease

## 2014-07-11 LAB — BASIC METABOLIC PANEL
BUN/Creatinine Ratio: 16 (ref 10–22)
BUN: 16 mg/dL (ref 8–27)
CHLORIDE: 99 mmol/L (ref 97–108)
CO2: 22 mmol/L (ref 18–29)
Calcium: 10.6 mg/dL — ABNORMAL HIGH (ref 8.6–10.2)
Creatinine, Ser: 0.97 mg/dL (ref 0.76–1.27)
GFR, EST AFRICAN AMERICAN: 85 mL/min/{1.73_m2} (ref >59)
GFR, EST NON AFRICAN AMERICAN: 73 mL/min/{1.73_m2} (ref >59)
GLUCOSE: 116 mg/dL — AB (ref 65–99)
POTASSIUM: 5.5 mmol/L — AB (ref 3.5–5.2)
SODIUM: 137 mmol/L (ref 134–144)

## 2014-07-11 LAB — HEPATIC FUNCTION PANEL
ALBUMIN: 4.1 g/dL (ref 3.5–4.7)
ALT: 12 IU/L (ref 0–44)
AST: 11 IU/L (ref 0–40)
Alkaline Phosphatase: 99 IU/L (ref 39–117)
BILIRUBIN TOTAL: 0.3 mg/dL (ref 0.0–1.2)
Bilirubin, Direct: 0.1 mg/dL (ref 0.00–0.40)
Total Protein: 6.9 g/dL (ref 6.0–8.5)

## 2014-07-11 LAB — LIPID PANEL
CHOL/HDL RATIO: 8.1 ratio — AB (ref 0.0–5.0)
CHOLESTEROL TOTAL: 252 mg/dL — AB (ref 100–199)
HDL: 31 mg/dL — ABNORMAL LOW (ref >39)
LDL CALC: 158 mg/dL — AB (ref 0–99)
Triglycerides: 316 mg/dL — ABNORMAL HIGH (ref 0–149)
VLDL Cholesterol Cal: 63 mg/dL — ABNORMAL HIGH (ref 5–40)

## 2014-07-11 NOTE — Telephone Encounter (Signed)
Patient called back for his lab results. Per Dr. Acie Fredrickson, potassium is high and patient should decrease his intake of foods that are high in potassium. Patient verbalized understanding. Patient requested a copy to be mailed. Checked mailing address and will send copy to patient.

## 2014-07-11 NOTE — Telephone Encounter (Signed)
New problem ° ° °Pt returning your call. °

## 2014-07-13 ENCOUNTER — Encounter: Payer: Self-pay | Admitting: Cardiovascular Disease

## 2014-07-13 ENCOUNTER — Ambulatory Visit (INDEPENDENT_AMBULATORY_CARE_PROVIDER_SITE_OTHER): Payer: Medicare Other | Admitting: Cardiovascular Disease

## 2014-07-13 VITALS — BP 148/80 | HR 59 | Ht 66.0 in | Wt 153.5 lb

## 2014-07-13 DIAGNOSIS — I25709 Atherosclerosis of coronary artery bypass graft(s), unspecified, with unspecified angina pectoris: Secondary | ICD-10-CM

## 2014-07-13 DIAGNOSIS — I10 Essential (primary) hypertension: Secondary | ICD-10-CM

## 2014-07-13 DIAGNOSIS — E785 Hyperlipidemia, unspecified: Secondary | ICD-10-CM

## 2014-07-13 MED ORDER — NITROGLYCERIN 0.4 MG SL SUBL: 0.4000 mg | SUBLINGUAL_TABLET | SUBLINGUAL | Status: DC | PRN

## 2014-07-13 NOTE — Progress Notes (Signed)
Cardiology Office Note   Date:  07/13/2014   ID:  [name]., DOB May 05, 1932, MRN [id number]  PCP:  [name], MD  Cardiologist:   Thayer Headings, MD   Chief Complaint  Patient presents with  . other    6 month follow up. Meds reviewed by the patient verbally. "doing well."   1. Coronary artery disease-status post CABG in 2007 2. Diabetes mellitus 3. Hyperlipidemia 4. Hypertension   History of Present Illness:  81 yo gentleman with a hx of CAD - s/p CABG, HTN,   He's had some recent episodes of dizziness. His blood pressure has also been elevated. He felt poorly. He denies any chest pain or shortness breath. He had lots of diaphoresis. He called me last week complaining of not feeling well. It was difficult for him to describe exactly what his symptoms were but he did not describe any chest pain or shortness breath. He did have the sensation of flushing and also had profound diaphoresis. His blood pressure was noted to be elevated. He was seen by [name] here in the office. She increase his carvedilol and he presents today for further evaluation.  He 's feeling somewhat fatigued today. He complains of left arm and left leg numbness. He was walking up until 4-5 weeks ago ( 50 minutes a day 5-6 days a week without problems)  He has restarted his Aspirin and his coreg. He has not had any angina.   August 25, 2012:  [name] ruptured a tendon in his right leg and is still having problems. No CP   Dec. 9, 2014: [name] was found to have a superficial DVT. He's been on Lovenox for the past 7 days. He has had a kidney stone. He also has had a ruptured tendon in his right leg earlier this year. He has not as active.  His wife states that he is tired. He has lost 40 lbs this past 6 months. I suspect that these 2 complaints are related. He has chills, no fevers, no sweats. He is eating OK - he quit drinking sodas which may be contributing to this weight  loss. His BP is high here but his wife states that his BP is normal and even low at home.   August 02, 2013:  [name] has continued to have lots of issues - kidney stones, fracture of hip, . No cardiac issues.  He's been going to the [facility]. He's had an echocardiogram which revealed normal left and her systolic function with an EF of 55%. He had mild aortic sclerosis. He has trace mitral regurgitation and mild tricuspid regurgitation. He also has had a pharmacological stress Myoview study at the [place] which was unremarkable.   Dec. 4, 2015:  [name] is a 81 y.o. man who I seen for CAD and HTN. He also has a history of hyperlipidemia and diabetes mellitus. Everything seems to be going much better. Walking daily on a treadmill . Feels well on the treadmill.  Had a carotid duplex that showed mild irreg.    Jul 13, 2014:  Johnny [name]. is a 81 y.o. male who presents for follow up of his CAD He is doing well.  No CP Was seen with his wife.  Memory seems to be challenged at times Wants to start going back to the [facility] office.    Past Medical History  Diagnosis Date  . CORONARY ARTERY DISEASE Nov 2007    CABG x3   .  DIABETES MELLITUS, TYPE II   . HYPERLIPIDEMIA     Not able to take statins.  [name] HYPERTENSION   . Arthritis   . History of kidney stones   . Hip fracture, right   . Kidney stone 2015    Past Surgical History  Procedure Laterality Date  . Vasectomy    . Tonsillectomy    . Coronary artery bypass graft  01/17/2006    x3 using a left internal mammary artery graft to left anterior descending coronary artery, saphenous vein graft to obtuse marginal branch, saphenous vein graft to posterior descending branch / Endoscopic vein harvesting from the right leg.  . Coronary angioplasty with stent placement  01/09/2006    Three-vessel CAD  His right coronary artery is extremely tight.  He has moderate to severe disease in the LAD & left circumflex artery.  The LAD is very  heavily calcified and the proximal and ostial LAD is not a good target for PTCA.  We will refer him to CVTS for further evaluation  . Coronary angioplasty with stent placement  02/14/2000    EF - of around 65-70%./PTCA and stenting of his mid right  coronary artery (4.0 x 18 mm NIR stent to the mid RCA).  He was noted  to have a 50% ostial stenosis at the time of his heart catheterization   . Tonsillectomy    . Tendon repair  03/11/2012    Procedure: TENDON REPAIR;  Surgeon: Wylene Simmer, MD;  Location: [place];  Service: Orthopedics;  Laterality: Right;  Right Tibialis Tendon Repair With Gastrocnemius Recession; Possible PLANTARIS AUTOGRAFT   . Gastrocnemius recession  03/11/2012    Procedure: GASTROCNEMIUS SLIDE;  Surgeon: Wylene Simmer, MD;  Location: [place];  Service: Orthopedics;  Laterality: Right;  . Hip fracture surgery      right     Current Outpatient Prescriptions  Medication Sig Dispense Refill  . carvedilol (COREG) 6.25 MG tablet Take 6.25 mg by mouth 2 (two) times daily with a meal.    . Flaxseed, Linseed, POWD Take by mouth daily.      . metFORMIN (GLUCOPHAGE) 500 MG tablet Take 500 mg by mouth 2 (two) times daily with a meal.     . nitroGLYCERIN (NITROSTAT) 0.4 MG SL tablet Place 0.4 mg under the tongue every 5 (five) minutes as needed for chest pain.    . tamsulosin (FLOMAX) 0.4 MG CAPS capsule Take 0.4 mg by mouth daily.    . travoprost, benzalkonium, (TRAVATAN) 0.004 % ophthalmic solution Place 1 drop into both eyes at bedtime.      No current facility-administered medications for this visit.    Allergies:   Cozaar; Ace inhibitors; Crestor; Lipitor; Lisinopril; Lovastatin; Pravastatin; Zocor; and Latex    Social History:  The patient  reports that he quit smoking about 49 years ago. His smoking use included Cigarettes. He has never used smokeless tobacco. He reports that he does not drink alcohol or use illicit drugs.   Family History:   The patient's family history includes Heart attack in his father; Stroke in his mother.    ROS:  Please see the history of present illness.    Review of Systems: Constitutional:  denies fever, chills, diaphoresis, appetite change and fatigue.  HEENT: denies photophobia, eye pain, redness, hearing loss, ear pain, congestion, sore throat, rhinorrhea, sneezing, neck pain, neck stiffness and tinnitus.  Respiratory: denies SOB, DOE, cough, chest tightness, and wheezing.  Cardiovascular: denies chest pain, palpitations  and leg swelling.  Gastrointestinal: denies nausea, vomiting, abdominal pain, diarrhea, constipation, blood in stool.  Genitourinary: denies dysuria, urgency, frequency, hematuria, flank pain and difficulty urinating.  Musculoskeletal: denies  myalgias, back pain, joint swelling, arthralgias and gait problem.   Skin: denies pallor, rash and wound.  Neurological: denies dizziness, seizures, syncope, weakness, light-headedness, numbness and headaches.   Hematological: denies adenopathy, easy bruising, personal or family bleeding history.  Psychiatric/ Behavioral: denies suicidal ideation, mood changes, confusion, nervousness, sleep disturbance and agitation.       All other systems are reviewed and negative.    PHYSICAL EXAM: VS:  BP 148/80 mmHg  Pulse 59  Ht 5\' 6"  (1.676 m)  Wt 69.627 kg (153 lb 8 oz)  BMI 24.79 kg/m2 , BMI Body mass index is 24.79 kg/(m^2). GEN: Well nourished, well developed, in no acute distress HEENT: normal Neck: no JVD, carotid bruits, or masses Cardiac: RRR; no murmurs, rubs, or gallops,no edema  Respiratory:  clear to auscultation bilaterally, normal work of breathing GI: soft, nontender, nondistended, + BS MS: no deformity or atrophy Skin: warm and dry, no rash Neuro:  Strength and sensation are intact Psych: normal   EKG:  EKG is ordered today. The ekg ordered today demonstrates ;  Sinus brady at 59/  Otherwise normal    Recent  Labs: 07/10/2014: ALT 12; BUN 16; Creatinine 0.97; Potassium 5.5*; Sodium 137    Lipid Panel    Component Value Date/Time   CHOL 252* 07/10/2014 0920   CHOL 233* 08/18/2012 0916   TRIG 316* 07/10/2014 0920   HDL 31* 07/10/2014 0920   HDL 37.30* 08/18/2012 0916   CHOLHDL 8.1* 07/10/2014 0920   CHOLHDL 6 08/18/2012 0916   VLDL 25.2 08/18/2012 0916   LDLCALC 158* 07/10/2014 0920   LDLCALC 93 10/31/2008 0914   LDLDIRECT 167.1 08/18/2012 0916      Wt Readings from Last 3 Encounters:  07/13/14 69.627 kg (153 lb 8 oz)  01/27/14 68.607 kg (151 lb 4 oz)  08/02/13 64.638 kg (142 lb 8 oz)      Other studies Reviewed: Additional studies/ records that were reviewed today include: . Review of the above records demonstrates:    ASSESSMENT AND PLAN:  1. Coronary artery disease-status post CABG in 2007 - he's not having any episodes of angina. Continue current medications. 2. Diabetes mellitus - further management from his internal medicine doctor. 3. Hyperlipidemia - his last lipid levels drawn several days ago were slightly elevated. He was not fasting for the blood draw. Will have him fast for his next blood draw in 6 months. 4. Hypertension - blood pressure levels overall  Have been okay.   Current medicines are reviewed at length with the patient today.  The patient does not have concerns regarding medicines.  The following changes have been made:  no change  Labs/ tests ordered today include:  Orders Placed This Encounter  Procedures  . EKG 12-Lead     Disposition:   FU with me in 6 months .  Will get fasting labs at that visit.  He wants to be followed up in the [place] office.       Ruari Mudgett, Wonda Cheng, MD  07/13/2014 11:55 AM    [facility] [place], [place], [place]  [address] Phone: [phone]; Fax: [phone]   [facility]  [address] [place], [place]  [address] [phone]    Fax [phone]

## 2014-07-13 NOTE — Patient Instructions (Addendum)
Medication Instructions:  Continue current medications  Labwork: Fasting labs in 6 months:  Lipid, Liver, BMET  Testing/Procedures: None  Follow-Up: Your physician wants you to follow-up in: 6 months.  You will receive a reminder letter in the mail two months in advance.  If you don't receive a letter, please call our office to schedule the follow-up appointment.

## 2015-01-02 ENCOUNTER — Other Ambulatory Visit (INDEPENDENT_AMBULATORY_CARE_PROVIDER_SITE_OTHER): Payer: Medicare Other | Admitting: *Deleted

## 2015-01-02 DIAGNOSIS — I10 Essential (primary) hypertension: Secondary | ICD-10-CM | POA: Diagnosis not present

## 2015-01-02 LAB — HEPATIC FUNCTION PANEL
ALT: 12 U/L (ref 9–46)
AST: 15 U/L (ref 10–35)
Albumin: 3.8 g/dL (ref 3.6–5.1)
Alkaline Phosphatase: 84 U/L (ref 40–115)
BILIRUBIN INDIRECT: 0.6 mg/dL (ref 0.2–1.2)
Bilirubin, Direct: 0.1 mg/dL (ref <=0.2)
TOTAL PROTEIN: 6.7 g/dL (ref 6.1–8.1)
Total Bilirubin: 0.7 mg/dL (ref 0.2–1.2)

## 2015-01-02 LAB — BASIC METABOLIC PANEL
BUN: 16 mg/dL (ref 7–25)
CALCIUM: 10.3 mg/dL (ref 8.6–10.3)
CHLORIDE: 101 mmol/L (ref 98–110)
CO2: 25 mmol/L (ref 20–31)
Creat: 1.03 mg/dL (ref 0.70–1.11)
Glucose, Bld: 106 mg/dL — ABNORMAL HIGH (ref 65–99)
POTASSIUM: 4.7 mmol/L (ref 3.5–5.3)
SODIUM: 135 mmol/L (ref 135–146)

## 2015-01-02 LAB — LIPID PANEL
Cholesterol: 224 mg/dL — ABNORMAL HIGH (ref 125–200)
HDL: 36 mg/dL — AB (ref >=40)
LDL CALC: 159 mg/dL — AB (ref <130)
Total CHOL/HDL Ratio: 6.2 Ratio — ABNORMAL HIGH (ref <=5.0)
Triglycerides: 144 mg/dL (ref <150)
VLDL: 29 mg/dL (ref <30)

## 2015-01-09 ENCOUNTER — Ambulatory Visit (INDEPENDENT_AMBULATORY_CARE_PROVIDER_SITE_OTHER): Payer: Medicare Other | Admitting: Cardiovascular Disease

## 2015-01-09 ENCOUNTER — Encounter: Payer: Self-pay | Admitting: Cardiovascular Disease

## 2015-01-09 ENCOUNTER — Ambulatory Visit: Payer: Medicare Other | Admitting: Cardiovascular Disease

## 2015-01-09 VITALS — BP 158/78 | HR 60 | Ht 66.0 in | Wt 152.8 lb

## 2015-01-09 DIAGNOSIS — E785 Hyperlipidemia, unspecified: Secondary | ICD-10-CM | POA: Diagnosis not present

## 2015-01-09 DIAGNOSIS — I25709 Atherosclerosis of coronary artery bypass graft(s), unspecified, with unspecified angina pectoris: Secondary | ICD-10-CM | POA: Diagnosis not present

## 2015-01-09 NOTE — Patient Instructions (Addendum)

## 2015-01-09 NOTE — Progress Notes (Signed)
Cardiology Office Note   Date:  01/09/2015   ID:  Johnny Peek., DOB 06-05-1932, MRN [id number]  PCP:  [name], MD  Cardiologist:   Thayer Headings, MD   Chief Complaint  Patient presents with  . Follow-up    cad   1. Coronary artery disease-status post CABG in 2007 2. Diabetes mellitus 3. Hyperlipidemia 4. Hypertension   History of Present Illness:  81 yo gentleman with a hx of CAD - s/p CABG, HTN,   He's had some recent episodes of dizziness. His blood pressure has also been elevated. He felt poorly. He denies any chest pain or shortness breath. He had lots of diaphoresis. He called me last week complaining of not feeling well. It was difficult for him to describe exactly what his symptoms were but he did not describe any chest pain or shortness breath. He did have the sensation of flushing and also had profound diaphoresis. His blood pressure was noted to be elevated. He was seen by [name] here in the office. She increase his carvedilol and he presents today for further evaluation.  He 's feeling somewhat fatigued today. He complains of left arm and left leg numbness. He was walking up until 4-5 weeks ago ( 50 minutes a day 5-6 days a week without problems)  He has restarted his Aspirin and his coreg. He has not had any angina.   August 25, 2012:  Johnny [name] ruptured a tendon in his right leg and is still having problems. No CP   Dec. 9, 2014: Johnny [name] was found to have a superficial DVT. He's been on Lovenox for the past 7 days. He has had a kidney stone. He also has had a ruptured tendon in his right leg earlier this year. He has not as active.  His wife states that he is tired. He has lost 40 lbs this past 6 months. I suspect that these 2 complaints are related. He has chills, no fevers, no sweats. He is eating OK - he quit drinking sodas which may be contributing to this weight loss. His BP is high here but his wife states that his BP is normal and  even low at home.   August 02, 2013:  Johnny [name] has continued to have lots of issues - kidney stones, fracture of hip, . No cardiac issues.  He's been going to the [facility]. He's had an echocardiogram which revealed normal left and her systolic function with an EF of 55%. He had mild aortic sclerosis. He has trace mitral regurgitation and mild tricuspid regurgitation. He also has had a pharmacological stress Myoview study at the [place] which was unremarkable.   Dec. 4, 2015:  Johnny [name] is a 81 y.o. man who I seen for CAD and HTN. He also has a history of hyperlipidemia and diabetes mellitus. Everything seems to be going much better. Walking daily on a treadmill . Feels well on the treadmill.  Had a carotid duplex that showed mild irreg.    Jul 13, 2014:  Johnny [name]. is a 81 y.o. male who presents for follow up of his CAD He is doing well.  No CP Was seen with his wife.  Memory seems to be challenged at times Wants to start going back to the [facility] office.   Nov. 15 ,2016:  Johnny [name] is seen today for follow up of his CAD  Nov. 15, 2016:  Johnny [name] is doing ok.    No CP  BP  Has  been OK Is a bit high today     Past Medical History  Diagnosis Date  . CORONARY ARTERY DISEASE Nov 2007    CABG x3   . DIABETES MELLITUS, TYPE II   . HYPERLIPIDEMIA     Not able to take statins.  [name] HYPERTENSION   . Arthritis   . History of kidney stones   . Hip fracture, right ([place])   . Kidney stone 2015    Past Surgical History  Procedure Laterality Date  . Vasectomy    . Tonsillectomy    . Coronary artery bypass graft  01/17/2006    x3 using a left internal mammary artery graft to left anterior descending coronary artery, saphenous vein graft to obtuse marginal branch, saphenous vein graft to posterior descending branch / Endoscopic vein harvesting from the right leg.  . Coronary angioplasty with stent placement  01/09/2006    Three-vessel CAD  His right coronary artery is extremely tight.   He has moderate to severe disease in the LAD & left circumflex artery.  The LAD is very heavily calcified and the proximal and ostial LAD is not a good target for PTCA.  We will refer him to CVTS for further evaluation  . Coronary angioplasty with stent placement  02/14/2000    EF - of around 65-70%./PTCA and stenting of his mid right  coronary artery (4.0 x 18 mm NIR stent to the mid RCA).  He was noted  to have a 50% ostial stenosis at the time of his heart catheterization   . Tonsillectomy    . Tendon repair  03/11/2012    Procedure: TENDON REPAIR;  Surgeon: Wylene Simmer, MD;  Location: [place];  Service: Orthopedics;  Laterality: Right;  Right Tibialis Tendon Repair With Gastrocnemius Recession; Possible PLANTARIS AUTOGRAFT   . Gastrocnemius recession  03/11/2012    Procedure: GASTROCNEMIUS SLIDE;  Surgeon: Wylene Simmer, MD;  Location: [place];  Service: Orthopedics;  Laterality: Right;  . Hip fracture surgery      right     Current Outpatient Prescriptions  Medication Sig Dispense Refill  . ACCU-CHEK AVIVA PLUS test strip     . ACCU-CHEK FASTCLIX LANCETS MISC     . carvedilol (COREG) 6.25 MG tablet Take 6.25 mg by mouth 2 (two) times daily with a meal.    . Flaxseed, Linseed, POWD Take 1 tablet by mouth daily.     . metFORMIN (GLUCOPHAGE) 500 MG tablet Take 500 mg by mouth 2 (two) times daily with a meal.     . nitroGLYCERIN (NITROSTAT) 0.4 MG SL tablet Place 1 tablet (0.4 mg total) under the tongue every 5 (five) minutes as needed for chest pain. 25 tablet 6  . tamsulosin (FLOMAX) 0.4 MG CAPS capsule Take 0.4 mg by mouth daily.    . travoprost, benzalkonium, (TRAVATAN) 0.004 % ophthalmic solution Place 1 drop into both eyes at bedtime.      No current facility-administered medications for this visit.    Allergies:   Cozaar; Ace inhibitors; Crestor; Lipitor; Lisinopril; Lovastatin; Pravastatin; Zocor; and Latex    Social History:  The patient   reports that he quit smoking about 49 years ago. His smoking use included Cigarettes. He has never used smokeless tobacco. He reports that he does not drink alcohol or use illicit drugs.   Family History:  The patient's family history includes Heart attack in his father; Stroke in his mother.    ROS:  Please see the history  of present illness.    Review of Systems: Constitutional:  denies fever, chills, diaphoresis, appetite change and fatigue.  HEENT: denies photophobia, eye pain, redness, hearing loss, ear pain, congestion, sore throat, rhinorrhea, sneezing, neck pain, neck stiffness and tinnitus.  Respiratory: denies SOB, DOE, cough, chest tightness, and wheezing.  Cardiovascular: denies chest pain, palpitations and leg swelling.  Gastrointestinal: denies nausea, vomiting, abdominal pain, diarrhea, constipation, blood in stool.  Genitourinary: denies dysuria, urgency, frequency, hematuria, flank pain and difficulty urinating.  Musculoskeletal: denies  myalgias, back pain, joint swelling, arthralgias and gait problem.   Skin: denies pallor, rash and wound.  Neurological: denies dizziness, seizures, syncope, weakness, light-headedness, numbness and headaches.   Hematological: denies adenopathy, easy bruising, personal or family bleeding history.  Psychiatric/ Behavioral: denies suicidal ideation, mood changes, confusion, nervousness, sleep disturbance and agitation.       All other systems are reviewed and negative.    PHYSICAL EXAM: VS:  BP 158/78 mmHg  Pulse 60  Ht 5\' 6"  (1.676 m)  Wt 152 lb 12.8 oz (69.31 kg)  BMI 24.67 kg/m2 , BMI Body mass index is 24.67 kg/(m^2). GEN: Well nourished, well developed, in no acute distress HEENT: normal Neck: no JVD, carotid bruits, or masses Cardiac: RRR; no murmurs, rubs, or gallops,no edema  Respiratory:  clear to auscultation bilaterally, normal work of breathing GI: soft, nontender, nondistended, + BS MS: no deformity or atrophy Skin:  warm and dry, no rash Neuro:  Strength and sensation are intact Psych: normal   EKG:  EKG is ordered today. The ekg ordered today demonstrates ;  Sinus brady at 59/  Otherwise normal    Recent Labs: 01/02/2015: ALT 12; BUN 16; Creat 1.03; Potassium 4.7; Sodium 135    Lipid Panel    Component Value Date/Time   CHOL 224* 01/02/2015 0833   CHOL 252* 07/10/2014 0920   TRIG 144 01/02/2015 0833   HDL 36* 01/02/2015 0833   HDL 31* 07/10/2014 0920   CHOLHDL 6.2* 01/02/2015 0833   CHOLHDL 8.1* 07/10/2014 0920   VLDL 29 01/02/2015 0833   LDLCALC 159* 01/02/2015 0833   LDLCALC 158* 07/10/2014 0920   LDLDIRECT 167.1 08/18/2012 0916      Wt Readings from Last 3 Encounters:  01/09/15 152 lb 12.8 oz (69.31 kg)  07/13/14 153 lb 8 oz (69.627 kg)  01/27/14 151 lb 4 oz (68.607 kg)      Other studies Reviewed: Additional studies/ records that were reviewed today include: . Review of the above records demonstrates:    ASSESSMENT AND PLAN:  1. Coronary artery disease-status post CABG in 2007 - he's not having any episodes of angina. Continue current medications. 2. Diabetes mellitus - further management from his internal medicine doctor.  3. Hyperlipidemia - his last lipid levels drawn several days ago were slightly elevated. He was not fasting for the blood draw. Will have him fast for his next blood draw in 6 months.  4. Hypertension - blood pressure levels overall  Have been okay.   Current medicines are reviewed at length with the patient today.  The patient does not have concerns regarding medicines.  The following changes have been made:  no change  Labs/ tests ordered today include:  No orders of the defined types were placed in this encounter.     Disposition:   FU with me in 6 months .        Jasenia Weilbacher, Wonda Cheng, MD  01/09/2015 11:56 AM    [facility]  [place], [place], [place]  [address] Phone: [phone]; Fax: [phone]    [facility]  [address] [place], [place]  [address] [phone]    Fax [phone]

## 2015-07-20 ENCOUNTER — Encounter: Payer: Self-pay | Admitting: Cardiovascular Disease

## 2015-07-20 ENCOUNTER — Ambulatory Visit (INDEPENDENT_AMBULATORY_CARE_PROVIDER_SITE_OTHER): Payer: Medicare Other | Admitting: Cardiovascular Disease

## 2015-07-20 VITALS — BP 146/78 | HR 60 | Ht 66.0 in | Wt 156.4 lb

## 2015-07-20 DIAGNOSIS — I25709 Atherosclerosis of coronary artery bypass graft(s), unspecified, with unspecified angina pectoris: Secondary | ICD-10-CM | POA: Diagnosis not present

## 2015-07-20 DIAGNOSIS — I10 Essential (primary) hypertension: Secondary | ICD-10-CM

## 2015-07-20 NOTE — Patient Instructions (Signed)
Medication Instructions:  Your physician recommends that you continue on your current medications as directed. Please refer to the Current Medication list given to you today. Consider Repatha for elevated cholesterol   Labwork: None Ordered   Testing/Procedures: None Ordered   Follow-Up: Your physician wants you to follow-up in: 6 months with Dr. Acie Fredrickson.  You will receive a reminder letter in the mail two months in advance. If you don't receive a letter, please call our office to schedule the follow-up appointment.   If you need a refill on your cardiac medications before your next appointment, please call your pharmacy.   Thank you for choosing [facility]! Christen Bame, RN [phone]

## 2015-07-20 NOTE — Progress Notes (Signed)
Cardiology Office Note   Date:  07/20/2015   ID:  [name]., DOB 1932/08/09, MRN [id number]  PCP:  [name], MD  Cardiologist:   [name], MD   Chief Complaint  Patient presents with  . Coronary Artery Disease   1. Coronary artery disease-status post CABG in 2007 2. Diabetes mellitus 3. Hyperlipidemia 4. Hypertension   History of Present Illness:  82 yo gentleman with a hx of CAD - s/p CABG, HTN,   He's had some recent episodes of dizziness. His blood pressure has also been elevated. He felt poorly. He denies any chest pain or shortness breath. He had lots of diaphoresis. He called me last week complaining of not feeling well. It was difficult for him to describe exactly what his symptoms were but he did not describe any chest pain or shortness breath. He did have the sensation of flushing and also had profound diaphoresis. His blood pressure was noted to be elevated. He was seen by [name] here in the office. She increase his carvedilol and he presents today for further evaluation.  He 's feeling somewhat fatigued today. He complains of left arm and left leg numbness. He was walking up until 4-5 weeks ago ( 50 minutes a day 5-6 days a week without problems)  He has restarted his Aspirin and his coreg. He has not had any angina.   August 25, 2012:  [name] ruptured a tendon in his right leg and is still having problems. No CP   Dec. 9, 2014: [name] was found to have a superficial DVT. He's been on Lovenox for the past 7 days. He has had a kidney stone. He also has had a ruptured tendon in his right leg earlier this year. He has not as active.  His wife states that he is tired. He has lost 40 lbs this past 6 months. I suspect that these 2 complaints are related. He has chills, no fevers, no sweats. He is eating OK - he quit drinking sodas which may be contributing to this weight loss. His BP is high here but his wife states that his BP is normal  and even low at home.   August 02, 2013:  [name] has continued to have lots of issues - kidney stones, fracture of hip, . No cardiac issues.  He's been going to the [facility]. He's had an echocardiogram which revealed normal left and her systolic function with an EF of 55%. He had mild aortic sclerosis. He has trace mitral regurgitation and mild tricuspid regurgitation. He also has had a pharmacological stress Myoview study at the [place] which was unremarkable.   Dec. 4, 2015:  [name] is a 82 y.o. man who I seen for CAD and HTN. He also has a history of hyperlipidemia and diabetes mellitus. Everything seems to be going much better. Walking daily on a treadmill . Feels well on the treadmill.  Had a carotid duplex that showed mild irreg.    Jul 13, 2014:  Yoscar Alberts. is a 82 y.o. male who presents for follow up of his CAD He is doing well.  No CP Was seen with his wife.  Memory seems to be challenged at times Wants to start going back to the [facility] office.   Nov. 15 ,2016:  [name] is seen today for follow up of his CAD  Nov. 15, 2016:  [name] is doing ok.    No CP  BP  Has been OK Is  a bit high today   Jul 20, 2015:  [name] is doing  Well from a cardiac standpoint Has had some podiatry work ,    Past Medical History  Diagnosis Date  . CORONARY ARTERY DISEASE Nov 2007    CABG x3   . DIABETES MELLITUS, TYPE II   . HYPERLIPIDEMIA     Not able to take statins.  [name] HYPERTENSION   . Arthritis   . History of kidney stones   . Hip fracture, right ([place])   . Kidney stone 2015    Past Surgical History  Procedure Laterality Date  . Vasectomy    . Tonsillectomy    . Coronary artery bypass graft  01/17/2006    x3 using a left internal mammary artery graft to left anterior descending coronary artery, saphenous vein graft to obtuse marginal branch, saphenous vein graft to posterior descending branch / Endoscopic vein harvesting from the right leg.  . Coronary angioplasty with  stent placement  01/09/2006    Three-vessel CAD  His right coronary artery is extremely tight.  He has moderate to severe disease in the LAD & left circumflex artery.  The LAD is very heavily calcified and the proximal and ostial LAD is not a good target for PTCA.  We will refer him to CVTS for further evaluation  . Coronary angioplasty with stent placement  02/14/2000    EF - of around 65-70%./PTCA and stenting of his mid right  coronary artery (4.0 x 18 mm NIR stent to the mid RCA).  He was noted  to have a 50% ostial stenosis at the time of his heart catheterization   . Tonsillectomy    . Tendon repair  03/11/2012    Procedure: TENDON REPAIR;  Surgeon: Wylene Simmer, MD;  Location: [place];  Service: Orthopedics;  Laterality: Right;  Right Tibialis Tendon Repair With Gastrocnemius Recession; Possible PLANTARIS AUTOGRAFT   . Gastrocnemius recession  03/11/2012    Procedure: GASTROCNEMIUS SLIDE;  Surgeon: Wylene Simmer, MD;  Location: [place];  Service: Orthopedics;  Laterality: Right;  . Hip fracture surgery      right     Current Outpatient Prescriptions  Medication Sig Dispense Refill  . ACCU-CHEK AVIVA PLUS test strip 1 each by Other route as needed.     [name] ACCU-CHEK FASTCLIX LANCETS MISC Inject as directed as directed.     [name] aspirin 81 MG tablet Take 81 mg by mouth daily.    . carvedilol (COREG) 6.25 MG tablet Take 6.25 mg by mouth 2 (two) times daily with a meal.    . Flaxseed, Linseed, POWD Take 1 tablet by mouth daily.     . metFORMIN (GLUCOPHAGE) 500 MG tablet Take 500 mg by mouth daily with breakfast.     . nitroGLYCERIN (NITROSTAT) 0.4 MG SL tablet Place 1 tablet (0.4 mg total) under the tongue every 5 (five) minutes as needed for chest pain. 25 tablet 6  . tamsulosin (FLOMAX) 0.4 MG CAPS capsule Take 0.4 mg by mouth 2 (two) times daily.     . travoprost, benzalkonium, (TRAVATAN) 0.004 % ophthalmic solution Place 1 drop into both eyes at bedtime.       No current facility-administered medications for this visit.    Allergies:   Cozaar; Ace inhibitors; Crestor; Lipitor; Lisinopril; Lovastatin; Pravastatin; Zocor; and Latex    Social History:  The patient  reports that he quit smoking about 50 years ago. His smoking use included Cigarettes. He has never  used smokeless tobacco. He reports that he does not drink alcohol or use illicit drugs.   Family History:  The patient's family history includes Heart attack in his father; Stroke in his mother.    ROS:  Please see the history of present illness.    Review of Systems: Constitutional:  denies fever, chills, diaphoresis, appetite change and fatigue.  HEENT: denies photophobia, eye pain, redness, hearing loss, ear pain, congestion, sore throat, rhinorrhea, sneezing, neck pain, neck stiffness and tinnitus.  Respiratory: denies SOB, DOE, cough, chest tightness, and wheezing.  Cardiovascular: denies chest pain, palpitations and leg swelling.  Gastrointestinal: denies nausea, vomiting, abdominal pain, diarrhea, constipation, blood in stool.  Genitourinary: denies dysuria, urgency, frequency, hematuria, flank pain and difficulty urinating.  Musculoskeletal: denies  myalgias, back pain, joint swelling, arthralgias and gait problem.   Skin: denies pallor, rash and wound.  Neurological: denies dizziness, seizures, syncope, weakness, light-headedness, numbness and headaches.   Hematological: denies adenopathy, easy bruising, personal or family bleeding history.  Psychiatric/ Behavioral: denies suicidal ideation, mood changes, confusion, nervousness, sleep disturbance and agitation.       All other systems are reviewed and negative.    PHYSICAL EXAM: VS:  BP 146/78 mmHg  Pulse 60  Ht 5\' 6"  (1.676 m)  Wt 156 lb 6.4 oz (70.943 kg)  BMI 25.26 kg/m2 , BMI Body mass index is 25.26 kg/(m^2). GEN: Well nourished, well developed, in no acute distress HEENT: normal Neck: no JVD, carotid bruits,  or masses Cardiac: RRR; no murmurs, rubs, or gallops,no edema  Respiratory:  clear to auscultation bilaterally, normal work of breathing GI: soft, nontender, nondistended, + BS MS: no deformity or atrophy Skin: warm and dry, no rash Neuro:  Strength and sensation are intact Psych: normal   EKG:  EKG is ordered today. The ekg ordered today demonstrates ;  Sinus brady at 59/  Otherwise normal    Recent Labs: 01/02/2015: ALT 12; BUN 16; Creat 1.03; Potassium 4.7; Sodium 135    Lipid Panel    Component Value Date/Time   CHOL 224* 01/02/2015 0833   CHOL 252* 07/10/2014 0920   TRIG 144 01/02/2015 0833   HDL 36* 01/02/2015 0833   HDL 31* 07/10/2014 0920   CHOLHDL 6.2* 01/02/2015 0833   CHOLHDL 8.1* 07/10/2014 0920   VLDL 29 01/02/2015 0833   LDLCALC 159* 01/02/2015 0833   LDLCALC 158* 07/10/2014 0920   LDLDIRECT 167.1 08/18/2012 0916      Wt Readings from Last 3 Encounters:  07/20/15 156 lb 6.4 oz (70.943 kg)  01/09/15 152 lb 12.8 oz (69.31 kg)  07/13/14 153 lb 8 oz (69.627 kg)      Other studies Reviewed: Additional studies/ records that were reviewed today include: . Review of the above records demonstrates:    ASSESSMENT AND PLAN:  1. Coronary artery disease-status post CABG in 2007 - he's not having any episodes of angina. Continue current medications. 2. Diabetes mellitus - further management from his internal medicine doctor.  3. Hyperlipidemia - lipds are elevated.  Does not tolerate statins or Zetia.  He should consider Repatha  He is followed at the [place]   4. Hypertension - blood pressure levels overall  Have been okay.  Current medicines are reviewed at length with the patient today.  The patient does not have concerns regarding medicines.  The following changes have been made:  no change  Labs/ tests ordered today include:  No orders of the defined types were placed in this encounter.  Disposition:   FU with me in 6 months .    [name],  MD  07/20/2015 11:13 AM    [facility] [place], [place], [place]  [address] Phone: [phone]; Fax: [phone]   [facility]  [address] [place], [place]  [address] [phone]    Fax [phone]

## 2015-08-03 ENCOUNTER — Other Ambulatory Visit: Payer: Self-pay | Admitting: Pharmacist

## 2015-08-03 DIAGNOSIS — E785 Hyperlipidemia, unspecified: Secondary | ICD-10-CM

## 2015-08-06 ENCOUNTER — Other Ambulatory Visit (INDEPENDENT_AMBULATORY_CARE_PROVIDER_SITE_OTHER): Payer: Medicare Other | Admitting: *Deleted

## 2015-08-06 DIAGNOSIS — E785 Hyperlipidemia, unspecified: Secondary | ICD-10-CM | POA: Diagnosis not present

## 2015-08-06 LAB — HEPATIC FUNCTION PANEL
ALT: 9 U/L (ref 9–46)
AST: 12 U/L (ref 10–35)
Albumin: 4 g/dL (ref 3.6–5.1)
Alkaline Phosphatase: 75 U/L (ref 40–115)
BILIRUBIN DIRECT: 0.1 mg/dL (ref <=0.2)
BILIRUBIN INDIRECT: 0.6 mg/dL (ref 0.2–1.2)
BILIRUBIN TOTAL: 0.7 mg/dL (ref 0.2–1.2)
Total Protein: 6.1 g/dL (ref 6.1–8.1)

## 2015-08-06 LAB — LIPID PANEL
CHOL/HDL RATIO: 6.6 ratio — AB (ref <=5.0)
Cholesterol: 239 mg/dL — ABNORMAL HIGH (ref 125–200)
HDL: 36 mg/dL — ABNORMAL LOW (ref >=40)
LDL CALC: 170 mg/dL — AB (ref <130)
Triglycerides: 164 mg/dL — ABNORMAL HIGH (ref <150)
VLDL: 33 mg/dL — AB (ref <30)

## 2015-08-07 ENCOUNTER — Telehealth: Payer: Self-pay | Admitting: Cardiovascular Disease

## 2015-08-07 NOTE — Telephone Encounter (Signed)
Pt returning call to [place] aware she's not in-pls advise pt of nature of call

## 2015-08-07 NOTE — Telephone Encounter (Signed)
Spoke with pt and reviewed lab results and recommendations from Dr. Acie Fredrickson with him.  He reports he is unable to take Zetia as it causes diarrhea. He states Elberta Leatherwood, PharmD is checking on repatha for him.  He also requests copy of lab work.  Results left at front desk for pt to pick up.  Will forward to pharmacist.

## 2015-08-08 NOTE — Telephone Encounter (Signed)
PA with new labs sent to insurance.  Will await response.

## 2015-08-16 ENCOUNTER — Telehealth: Payer: Self-pay | Admitting: Pharmacist

## 2015-08-16 MED ORDER — EVOLOCUMAB 140 MG/ML ~~LOC~~ SOAJ: 1.0000 "pen " | SUBCUTANEOUS | Status: DC

## 2015-08-16 NOTE — Telephone Encounter (Signed)
Patient called to say that he got an approval letter for Repatha through 02/08/16. Rx sent in to [facility]. Pt will call if copay is too expensive. He will also call once he receives his first shipment so he can come into clinic for his first injection.

## 2015-08-22 ENCOUNTER — Telehealth: Payer: Self-pay | Admitting: *Deleted

## 2015-08-22 NOTE — Telephone Encounter (Signed)
Per msg left on refill vm by [facility], they need an icd-10 code for an rx that they received. I assume that this is for the repatha. The number to call is [phone]. Thanks, MI

## 2015-08-22 NOTE — Telephone Encounter (Signed)
Called [facility].  Gave ICD code for hyperlipidemia.

## 2015-10-06 ENCOUNTER — Encounter (HOSPITAL_COMMUNITY): Payer: Self-pay

## 2015-10-06 ENCOUNTER — Emergency Department (HOSPITAL_COMMUNITY): Payer: Medicare Other

## 2015-10-06 ENCOUNTER — Inpatient Hospital Stay (HOSPITAL_COMMUNITY)
Admission: EM | Admit: 2015-10-06 | Discharge: 2015-10-09 | DRG: 282 | Disposition: A | Discharge status: Home / Self Care | Payer: Medicare Other | Attending: Internal Medicine | Admitting: Internal Medicine

## 2015-10-06 DIAGNOSIS — I160 Hypertensive urgency: Secondary | ICD-10-CM | POA: Diagnosis present

## 2015-10-06 DIAGNOSIS — Z91040 Latex allergy status: Secondary | ICD-10-CM

## 2015-10-06 DIAGNOSIS — R0609 Other forms of dyspnea: Secondary | ICD-10-CM | POA: Diagnosis present

## 2015-10-06 DIAGNOSIS — Z7984 Long term (current) use of oral hypoglycemic drugs: Secondary | ICD-10-CM

## 2015-10-06 DIAGNOSIS — I214 Non-ST elevation (NSTEMI) myocardial infarction: Secondary | ICD-10-CM | POA: Diagnosis not present

## 2015-10-06 DIAGNOSIS — R7989 Other specified abnormal findings of blood chemistry: Secondary | ICD-10-CM | POA: Diagnosis present

## 2015-10-06 DIAGNOSIS — Z888 Allergy status to other drugs, medicaments and biological substances status: Secondary | ICD-10-CM

## 2015-10-06 DIAGNOSIS — R0602 Shortness of breath: Secondary | ICD-10-CM | POA: Diagnosis not present

## 2015-10-06 DIAGNOSIS — Z951 Presence of aortocoronary bypass graft: Secondary | ICD-10-CM

## 2015-10-06 DIAGNOSIS — I10 Essential (primary) hypertension: Secondary | ICD-10-CM | POA: Diagnosis present

## 2015-10-06 DIAGNOSIS — E119 Type 2 diabetes mellitus without complications: Secondary | ICD-10-CM

## 2015-10-06 DIAGNOSIS — R0789 Other chest pain: Secondary | ICD-10-CM

## 2015-10-06 DIAGNOSIS — Z79899 Other long term (current) drug therapy: Secondary | ICD-10-CM

## 2015-10-06 DIAGNOSIS — R9439 Abnormal result of other cardiovascular function study: Secondary | ICD-10-CM | POA: Diagnosis present

## 2015-10-06 DIAGNOSIS — Z87891 Personal history of nicotine dependence: Secondary | ICD-10-CM

## 2015-10-06 DIAGNOSIS — Z8249 Family history of ischemic heart disease and other diseases of the circulatory system: Secondary | ICD-10-CM

## 2015-10-06 DIAGNOSIS — I119 Hypertensive heart disease without heart failure: Secondary | ICD-10-CM | POA: Diagnosis present

## 2015-10-06 DIAGNOSIS — Z7982 Long term (current) use of aspirin: Secondary | ICD-10-CM

## 2015-10-06 DIAGNOSIS — Z955 Presence of coronary angioplasty implant and graft: Secondary | ICD-10-CM

## 2015-10-06 DIAGNOSIS — I2510 Atherosclerotic heart disease of native coronary artery without angina pectoris: Secondary | ICD-10-CM | POA: Diagnosis present

## 2015-10-06 DIAGNOSIS — R079 Chest pain, unspecified: Secondary | ICD-10-CM

## 2015-10-06 DIAGNOSIS — R0600 Dyspnea, unspecified: Secondary | ICD-10-CM | POA: Diagnosis present

## 2015-10-06 DIAGNOSIS — R778 Other specified abnormalities of plasma proteins: Secondary | ICD-10-CM

## 2015-10-06 DIAGNOSIS — E785 Hyperlipidemia, unspecified: Secondary | ICD-10-CM | POA: Diagnosis present

## 2015-10-06 LAB — COMPREHENSIVE METABOLIC PANEL
ALT: 12 U/L — ABNORMAL LOW (ref 17–63)
AST: 18 U/L (ref 15–41)
Albumin: 3.6 g/dL (ref 3.5–5.0)
Alkaline Phosphatase: 91 U/L (ref 38–126)
Anion gap: 6 (ref 5–15)
BUN: 18 mg/dL (ref 6–20)
CHLORIDE: 102 mmol/L (ref 101–111)
CO2: 24 mmol/L (ref 22–32)
Calcium: 9.9 mg/dL (ref 8.9–10.3)
Creatinine, Ser: 0.96 mg/dL (ref 0.61–1.24)
Glucose, Bld: 198 mg/dL — ABNORMAL HIGH (ref 65–99)
POTASSIUM: 4.4 mmol/L (ref 3.5–5.1)
SODIUM: 132 mmol/L — AB (ref 135–145)
Total Bilirubin: 0.9 mg/dL (ref 0.3–1.2)
Total Protein: 6.4 g/dL — ABNORMAL LOW (ref 6.5–8.1)

## 2015-10-06 LAB — TROPONIN I: TROPONIN I: 0.04 ng/mL — AB (ref <0.03)

## 2015-10-06 LAB — CBC WITH DIFFERENTIAL/PLATELET
Basophils Absolute: 0 10*3/uL (ref 0.0–0.1)
Basophils Relative: 1 %
EOS ABS: 0.2 10*3/uL (ref 0.0–0.7)
Eosinophils Relative: 3 %
HCT: 41.7 % (ref 39.0–52.0)
HEMOGLOBIN: 14.1 g/dL (ref 13.0–17.0)
LYMPHS ABS: 1.6 10*3/uL (ref 0.7–4.0)
Lymphocytes Relative: 21 %
MCH: 30.7 pg (ref 26.0–34.0)
MCHC: 33.8 g/dL (ref 30.0–36.0)
MCV: 90.8 fL (ref 78.0–100.0)
Monocytes Absolute: 0.8 10*3/uL (ref 0.1–1.0)
Monocytes Relative: 11 %
NEUTROS PCT: 66 %
Neutro Abs: 4.9 10*3/uL (ref 1.7–7.7)
Platelets: 280 10*3/uL (ref 150–400)
RBC: 4.59 MIL/uL (ref 4.22–5.81)
RDW: 13 % (ref 11.5–15.5)
WBC: 7.5 10*3/uL (ref 4.0–10.5)

## 2015-10-06 LAB — URINALYSIS, ROUTINE W REFLEX MICROSCOPIC
Bilirubin Urine: NEGATIVE
Glucose, UA: NEGATIVE mg/dL
Ketones, ur: NEGATIVE mg/dL
Leukocytes, UA: NEGATIVE
NITRITE: NEGATIVE
PH: 7.5 (ref 5.0–8.0)
Protein, ur: 100 mg/dL — AB
SPECIFIC GRAVITY, URINE: 1.007 (ref 1.005–1.030)

## 2015-10-06 LAB — URINE MICROSCOPIC-ADD ON

## 2015-10-06 NOTE — ED Notes (Signed)
Patient transported to X-ray 

## 2015-10-06 NOTE — ED Provider Notes (Signed)
[facility] DEPT Provider Note   CSN: [id number] Arrival date & time: 10/06/15  1958  First Provider Contact:  None    By signing my name below, I, Johnny [name], attest that this documentation has been prepared under the direction and in the presence of Johnny Lange, PA-C. Electronically Signed: Dora [name], Scribe. 10/06/2015. 8:33 PM.  History   Chief Complaint Chief Complaint  Patient presents with  . Hypertension  . Shortness of Breath    with exertion    The history is provided by the patient. No language interpreter was used.     HPI Comments: Johnny [name]. is a 82 y.o. male with PMHx of CAD, DM type II, HLD, and HTN who presents to the Emergency Department complaining of elevated blood pressure beginning this afternoon. Pt reports his last normal blood pressure reading was this morning and became concerned when it became elevated. He notes some mild dizziness over the last several days as well as general malaise. Pt states he spent most of today sleeping. He notes h/o "silent ischemia." He also notes h/o CABG and renal stones requiring surgeries; he notes no pain with either. Pt denies fever, cough, nausea, vomiting, pain, or any other associated symptoms.  Past Medical History:  Diagnosis Date  . Arthritis   . CORONARY ARTERY DISEASE Nov 2007   CABG x3   . DIABETES MELLITUS, TYPE II   . Hip fracture, right ([place])   . History of kidney stones   . HYPERLIPIDEMIA    Not able to take statins.  [name] HYPERTENSION   . Kidney stone 2015    Patient Active Problem List   Diagnosis Date Noted  . Weight loss 02/01/2013  . DIABETES MELLITUS, TYPE II 07/17/2006  . Hyperlipidemia 07/17/2006  . Essential hypertension 07/17/2006  . Coronary atherosclerosis 07/17/2006    Past Surgical History:  Procedure Laterality Date  . CORONARY ANGIOPLASTY WITH STENT PLACEMENT  01/09/2006   Three-vessel CAD  His right coronary artery is extremely tight.  He has moderate to  severe disease in the LAD & left circumflex artery.  The LAD is very heavily calcified and the proximal and ostial LAD is not a good target for PTCA.  We will refer him to CVTS for further evaluation  . CORONARY ANGIOPLASTY WITH STENT PLACEMENT  02/14/2000   EF - of around 65-70%./PTCA and stenting of his mid right  coronary artery (4.0 x 18 mm NIR stent to the mid RCA).  He was noted  to have a 50% ostial stenosis at the time of his heart catheterization   . CORONARY ARTERY BYPASS GRAFT  01/17/2006   x3 using a left internal mammary artery graft to left anterior descending coronary artery, saphenous vein graft to obtuse marginal branch, saphenous vein graft to posterior descending branch / Endoscopic vein harvesting from the right leg.  [name] GASTROCNEMIUS RECESSION  03/11/2012   Procedure: GASTROCNEMIUS SLIDE;  Surgeon: Wylene Simmer, MD;  Location: [facility];  Service: Orthopedics;  Laterality: Right;  . HIP FRACTURE SURGERY     right  . TENDON REPAIR  03/11/2012   Procedure: TENDON REPAIR;  Surgeon: Wylene Simmer, MD;  Location: [facility];  Service: Orthopedics;  Laterality: Right;  Right Tibialis Tendon Repair With Gastrocnemius Recession; Possible PLANTARIS AUTOGRAFT   . TONSILLECTOMY    . TONSILLECTOMY    . VASECTOMY         Home Medications    Prior to Admission medications   Medication  Sig Start Date End Date Taking? Authorizing Provider  ACCU-CHEK AVIVA PLUS test strip 1 each by Other route as needed.  12/28/14   Historical Provider, MD  ACCU-CHEK FASTCLIX LANCETS MISC Inject as directed as directed.  11/20/14   Historical Provider, MD  aspirin 81 MG tablet Take 81 mg by mouth daily.    Historical Provider, MD  carvedilol (COREG) 6.25 MG tablet Take 6.25 mg by mouth every morning. Pt takes 3.125 mg in the evening    Historical Provider, MD  Evolocumab (REPATHA SURECLICK) [id number] MG/ML SOAJ Inject 1 pen into the skin every 14 (fourteen) days. 08/16/15   Thayer Headings, MD  Flaxseed, Linseed, POWD Take 1 tablet by mouth daily.     Historical Provider, MD  metFORMIN (GLUCOPHAGE) 500 MG tablet Take 500 mg by mouth daily with breakfast.     Historical Provider, MD  nitroGLYCERIN (NITROSTAT) 0.4 MG SL tablet Place 1 tablet (0.4 mg total) under the tongue every 5 (five) minutes as needed for chest pain. 07/13/14   Thayer Headings, MD  tamsulosin (FLOMAX) 0.4 MG CAPS capsule Take 0.4 mg by mouth 2 (two) times daily.     Historical Provider, MD  travoprost, benzalkonium, (TRAVATAN) 0.004 % ophthalmic solution Place 1 drop into both eyes at bedtime.     Historical Provider, MD    Family History Family History  Problem Relation Age of Onset  . Heart attack Father   . Stroke Mother     Social History Social History  Substance Use Topics  . Smoking status: Former Smoker    Types: Cigarettes    Quit date: 02/24/1965  . Smokeless tobacco: Never Used  . Alcohol use No     Allergies   Cozaar; Ace inhibitors; Crestor [rosuvastatin calcium]; Lipitor [atorvastatin calcium]; Lisinopril; Lovastatin; Pravastatin; Zetia [ezetimibe]; Zocor [simvastatin - high dose]; and Latex   Review of Systems Review of Systems  Constitutional: Positive for fatigue. Negative for fever.  Respiratory: Negative for cough.   Gastrointestinal: Negative for nausea and vomiting.  Musculoskeletal: Negative for arthralgias and myalgias.  Neurological: Positive for dizziness.    Physical Exam Updated Vital Signs BP 162/68   Pulse 63   Temp (!) 96.4 F (35.8 C) (Axillary)   Resp 12   Ht 5\' 8"  (1.727 m)   Wt 148 lb (67.1 kg)   SpO2 98%   BMI 22.50 kg/m   Physical Exam  Constitutional: He is oriented to person, place, and time. He appears well-developed and well-nourished. No distress.  HENT:  Head: Normocephalic and atraumatic.  Eyes: Conjunctivae and EOM are normal.  Neck: Neck supple. No tracheal deviation present.  Cardiovascular: Normal rate and regular rhythm.     No murmur heard. Pulmonary/Chest: Effort normal. No respiratory distress.  Lungs are clear with full breath sounds to all fields.  Abdominal: There is no tenderness.  Musculoskeletal: Normal range of motion.  No peripheral edema.  Neurological: He is alert and oriented to person, place, and time.  Skin: Skin is warm and dry.  Psychiatric: He has a normal mood and affect. His behavior is normal.  Nursing note and vitals reviewed.    ED Treatments / Results  Labs (all labs ordered are listed, but only abnormal results are displayed) Labs Reviewed  URINE CULTURE  CBC WITH DIFFERENTIAL/PLATELET  TROPONIN I  COMPREHENSIVE METABOLIC PANEL  URINALYSIS, ROUTINE W REFLEX MICROSCOPIC (NOT AT [facility])    EKG  EKG Interpretation None       Radiology No  results found.  Procedures Procedures (including critical care time)  DIAGNOSTIC STUDIES: Oxygen Saturation is 97% on RA, normal by my interpretation.    COORDINATION OF CARE: 8:33 PM Discussed treatment plan with pt at bedside and pt agreed to plan.   Medications Ordered in ED Medications - No data to display   Initial Impression / Assessment and Plan / ED Course  I have reviewed the triage vital signs and the nursing notes.  Pertinent labs & imaging results that were available during my care of the patient were reviewed by me and considered in my medical decision making (see chart for details).  Clinical Course    Patient presents with vague complaints of "not feeling well", increased sleeping, dizziness and elevated blood pressure today. He has a significant cardiac history of CABG in 2012 with "silent ischemia". No pain.   Initial troponin minimally elevated at 0.04. Non-ischemia EKG. Delta troponin pending. Delta troponin resulted as 0.38, in the result review as "10-05-15 2343", verified by lab staff as entered time error.  Dr. Rex Kras in to see the patient, discussed diagnosis and necessity for admission. Cardiology  paged. Heparin bolus and aspirin ordered.  Discussed with Dr. Fransico Him who advised patient can be admitted to medicine and transfer to [facility] is not needed at this time. Cardiology will consult in the morning at [place] paged.     I personally performed the services described in this documentation, which was scribed in my presence. The recorded information has been reviewed and is accurate.   CRITICAL CARE Performed by: Johnny [name] A   Total critical care time: 40 minutes  Critical care time was exclusive of separately billable procedures and treating other patients.  Critical care was necessary to treat or prevent imminent or life-threatening deterioration.  Critical care was time spent personally by me on the following activities: development of treatment plan with patient and/or surrogate as well as nursing, discussions with consultants, evaluation of patient's response to treatment, examination of patient, obtaining history from patient or surrogate, ordering and performing treatments and interventions, ordering and review of laboratory studies, ordering and review of radiographic studies, pulse oximetry and re-evaluation of patient's condition.  Final Clinical Impressions(s) / ED Diagnoses   Final diagnoses:  None    New Prescriptions New Prescriptions   No medications on file     Johnny [name], Hershal Coria 10/23/15 [place], MD 10/23/15 [phone]

## 2015-10-06 NOTE — ED Notes (Signed)
Pt ambulated to restroom. 

## 2015-10-06 NOTE — ED Triage Notes (Addendum)
Pt presents to the ED today complaining of hypertension. 200/83 Pt has a hx of kidney surgeries and CABG. Pt also complaining of exertional SOB. Denies chest pain. A&Ox4. Ambulatory.

## 2015-10-06 NOTE — ED Notes (Signed)
PA at bedside.

## 2015-10-07 ENCOUNTER — Encounter (HOSPITAL_COMMUNITY): Payer: Self-pay | Admitting: Internal Medicine

## 2015-10-07 ENCOUNTER — Observation Stay (HOSPITAL_BASED_OUTPATIENT_CLINIC_OR_DEPARTMENT_OTHER): Payer: Medicare Other

## 2015-10-07 DIAGNOSIS — I160 Hypertensive urgency: Secondary | ICD-10-CM | POA: Diagnosis present

## 2015-10-07 DIAGNOSIS — R778 Other specified abnormalities of plasma proteins: Secondary | ICD-10-CM | POA: Diagnosis present

## 2015-10-07 DIAGNOSIS — Z955 Presence of coronary angioplasty implant and graft: Secondary | ICD-10-CM | POA: Diagnosis not present

## 2015-10-07 DIAGNOSIS — I25119 Atherosclerotic heart disease of native coronary artery with unspecified angina pectoris: Secondary | ICD-10-CM | POA: Diagnosis not present

## 2015-10-07 DIAGNOSIS — Z87891 Personal history of nicotine dependence: Secondary | ICD-10-CM | POA: Diagnosis not present

## 2015-10-07 DIAGNOSIS — R9439 Abnormal result of other cardiovascular function study: Secondary | ICD-10-CM | POA: Diagnosis present

## 2015-10-07 DIAGNOSIS — R7989 Other specified abnormal findings of blood chemistry: Secondary | ICD-10-CM

## 2015-10-07 DIAGNOSIS — I119 Hypertensive heart disease without heart failure: Secondary | ICD-10-CM | POA: Diagnosis present

## 2015-10-07 DIAGNOSIS — E119 Type 2 diabetes mellitus without complications: Secondary | ICD-10-CM | POA: Diagnosis present

## 2015-10-07 DIAGNOSIS — E785 Hyperlipidemia, unspecified: Secondary | ICD-10-CM | POA: Diagnosis present

## 2015-10-07 DIAGNOSIS — I214 Non-ST elevation (NSTEMI) myocardial infarction: Secondary | ICD-10-CM | POA: Diagnosis present

## 2015-10-07 DIAGNOSIS — I10 Essential (primary) hypertension: Secondary | ICD-10-CM

## 2015-10-07 DIAGNOSIS — Z79899 Other long term (current) drug therapy: Secondary | ICD-10-CM | POA: Diagnosis not present

## 2015-10-07 DIAGNOSIS — Z951 Presence of aortocoronary bypass graft: Secondary | ICD-10-CM | POA: Diagnosis not present

## 2015-10-07 DIAGNOSIS — I2510 Atherosclerotic heart disease of native coronary artery without angina pectoris: Secondary | ICD-10-CM | POA: Diagnosis present

## 2015-10-07 DIAGNOSIS — Z8249 Family history of ischemic heart disease and other diseases of the circulatory system: Secondary | ICD-10-CM | POA: Diagnosis not present

## 2015-10-07 DIAGNOSIS — Z888 Allergy status to other drugs, medicaments and biological substances status: Secondary | ICD-10-CM | POA: Diagnosis not present

## 2015-10-07 DIAGNOSIS — Z7982 Long term (current) use of aspirin: Secondary | ICD-10-CM | POA: Diagnosis not present

## 2015-10-07 DIAGNOSIS — Z7984 Long term (current) use of oral hypoglycemic drugs: Secondary | ICD-10-CM | POA: Diagnosis not present

## 2015-10-07 DIAGNOSIS — R0602 Shortness of breath: Secondary | ICD-10-CM | POA: Diagnosis present

## 2015-10-07 DIAGNOSIS — R931 Abnormal findings on diagnostic imaging of heart and coronary circulation: Secondary | ICD-10-CM | POA: Diagnosis not present

## 2015-10-07 DIAGNOSIS — Z91040 Latex allergy status: Secondary | ICD-10-CM | POA: Diagnosis not present

## 2015-10-07 DIAGNOSIS — R0609 Other forms of dyspnea: Secondary | ICD-10-CM | POA: Diagnosis not present

## 2015-10-07 DIAGNOSIS — R079 Chest pain, unspecified: Secondary | ICD-10-CM | POA: Diagnosis not present

## 2015-10-07 LAB — PROTIME-INR
INR: 0.97
Prothrombin Time: 12.9 seconds (ref 11.4–15.2)

## 2015-10-07 LAB — LIPID PANEL
CHOL/HDL RATIO: 7.4 ratio
Cholesterol: 283 mg/dL — ABNORMAL HIGH (ref 0–200)
HDL: 38 mg/dL — AB (ref >40)
LDL CALC: 214 mg/dL — AB (ref 0–99)
Triglycerides: 154 mg/dL — ABNORMAL HIGH (ref <150)
VLDL: 31 mg/dL (ref 0–40)

## 2015-10-07 LAB — HEPARIN LEVEL (UNFRACTIONATED)
HEPARIN UNFRACTIONATED: 0.27 [IU]/mL — AB (ref 0.30–0.70)
HEPARIN UNFRACTIONATED: 0.38 [IU]/mL (ref 0.30–0.70)

## 2015-10-07 LAB — TROPONIN I
TROPONIN I: 0.38 ng/mL — AB (ref <0.03)
TROPONIN I: 0.38 ng/mL — AB (ref <0.03)
Troponin I: 0.48 ng/mL (ref <0.03)
Troponin I: 0.31 ng/mL (ref <0.03)
Troponin I: 0.37 ng/mL (ref <0.03)

## 2015-10-07 LAB — CBC
HCT: 41.9 % (ref 39.0–52.0)
Hemoglobin: 14.1 g/dL (ref 13.0–17.0)
MCH: 30.5 pg (ref 26.0–34.0)
MCHC: 33.7 g/dL (ref 30.0–36.0)
MCV: 90.7 fL (ref 78.0–100.0)
PLATELETS: 262 10*3/uL (ref 150–400)
RBC: 4.62 MIL/uL (ref 4.22–5.81)
RDW: 13.1 % (ref 11.5–15.5)
WBC: 8.8 10*3/uL (ref 4.0–10.5)

## 2015-10-07 LAB — GLUCOSE, CAPILLARY
GLUCOSE-CAPILLARY: 132 mg/dL — AB (ref 65–99)
GLUCOSE-CAPILLARY: 125 mg/dL — AB (ref 65–99)
Glucose-Capillary: 191 mg/dL — ABNORMAL HIGH (ref 65–99)
Glucose-Capillary: 100 mg/dL — ABNORMAL HIGH (ref 65–99)

## 2015-10-07 LAB — APTT: APTT: 37 s — AB (ref 24–36)

## 2015-10-07 LAB — ECHOCARDIOGRAM COMPLETE
Height: 67 in
Weight: 2377.6 oz

## 2015-10-07 LAB — BRAIN NATRIURETIC PEPTIDE: B Natriuretic Peptide: 88.5 pg/mL (ref 0.0–100.0)

## 2015-10-07 MED ORDER — ONDANSETRON HCL 4 MG/2ML IJ SOLN: 4.0000 mg | Freq: Four times a day (QID) | INTRAMUSCULAR | Status: DC | PRN

## 2015-10-07 MED ORDER — NITROGLYCERIN 0.4 MG SL SUBL: 0.4000 mg | SUBLINGUAL_TABLET | SUBLINGUAL | Status: DC | PRN

## 2015-10-07 MED ORDER — SODIUM CHLORIDE 0.9 % IV SOLN
INTRAVENOUS | Status: DC
Start: 2015-10-07 — End: 2015-10-09
  Administered 2015-10-07 – 2015-10-09 (×5): via INTRAVENOUS

## 2015-10-07 MED ORDER — MORPHINE SULFATE (PF) 2 MG/ML IV SOLN: 2.0000 mg | INTRAVENOUS | Status: DC | PRN

## 2015-10-07 MED ORDER — HEPARIN BOLUS VIA INFUSION
3500.0000 [IU] | Freq: Once | INTRAVENOUS | Status: AC
  Administered 2015-10-07: 3500 [IU] via INTRAVENOUS
  Filled 2015-10-07: qty 3500

## 2015-10-07 MED ORDER — AMLODIPINE BESYLATE 5 MG PO TABS
5.0000 mg | ORAL_TABLET | Freq: Every day | ORAL | Status: DC
  Administered 2015-10-07 – 2015-10-09 (×3): 5 mg via ORAL
  Filled 2015-10-07 (×4): qty 1

## 2015-10-07 MED ORDER — CARVEDILOL 3.125 MG PO TABS
3.1250 mg | ORAL_TABLET | Freq: Every day | ORAL | Status: DC
  Administered 2015-10-07 – 2015-10-08 (×2): 3.125 mg via ORAL
  Filled 2015-10-07 (×2): qty 1

## 2015-10-07 MED ORDER — HEPARIN (PORCINE) IN NACL 100-0.45 UNIT/ML-% IJ SOLN
950.0000 [IU]/h | INTRAMUSCULAR | Status: DC
  Administered 2015-10-07: 800 [IU]/h via INTRAVENOUS
  Administered 2015-10-07: 950 [IU]/h via INTRAVENOUS
  Filled 2015-10-07 (×3): qty 250

## 2015-10-07 MED ORDER — LATANOPROST 0.005 % OP SOLN
1.0000 [drp] | Freq: Every day | OPHTHALMIC | Status: DC
  Administered 2015-10-07 – 2015-10-08 (×2): 1 [drp] via OPHTHALMIC
  Filled 2015-10-07: qty 2.5

## 2015-10-07 MED ORDER — INSULIN ASPART 100 UNIT/ML ~~LOC~~ SOLN
0.0000 [IU] | Freq: Three times a day (TID) | SUBCUTANEOUS | Status: DC
  Administered 2015-10-07: 2 [IU] via SUBCUTANEOUS
  Administered 2015-10-07 (×2): 1 [IU] via SUBCUTANEOUS
  Administered 2015-10-08: 2 [IU] via SUBCUTANEOUS
  Administered 2015-10-08: 3 [IU] via SUBCUTANEOUS

## 2015-10-07 MED ORDER — ISOSORBIDE MONONITRATE ER 30 MG PO TB24
30.0000 mg | ORAL_TABLET | Freq: Every day | ORAL | Status: DC
  Administered 2015-10-07 – 2015-10-09 (×3): 30 mg via ORAL
  Filled 2015-10-07 (×3): qty 1

## 2015-10-07 MED ORDER — INSULIN ASPART 100 UNIT/ML ~~LOC~~ SOLN: 0.0000 [IU] | Freq: Every day | SUBCUTANEOUS | Status: DC

## 2015-10-07 MED ORDER — FLAXSEED (LINSEED) PO POWD: 1.0000 | Freq: Every day | ORAL | Status: DC

## 2015-10-07 MED ORDER — CARVEDILOL 3.125 MG PO TABS: 3.1250 mg | ORAL_TABLET | Freq: Two times a day (BID) | ORAL | Status: DC

## 2015-10-07 MED ORDER — ACETAMINOPHEN 325 MG PO TABS: 650.0000 mg | ORAL_TABLET | ORAL | Status: DC | PRN

## 2015-10-07 MED ORDER — ASPIRIN 81 MG PO CHEW
324.0000 mg | CHEWABLE_TABLET | Freq: Once | ORAL | Status: AC
  Administered 2015-10-07: 324 mg via ORAL
  Filled 2015-10-07: qty 4

## 2015-10-07 MED ORDER — REGADENOSON 0.4 MG/5ML IV SOLN
0.4000 mg | Freq: Once | INTRAVENOUS | Status: AC
  Administered 2015-10-08: 0.4 mg via INTRAVENOUS
  Filled 2015-10-07: qty 5

## 2015-10-07 MED ORDER — ASPIRIN 81 MG PO CHEW
324.0000 mg | CHEWABLE_TABLET | Freq: Every day | ORAL | Status: DC
  Administered 2015-10-07 – 2015-10-09 (×3): 324 mg via ORAL
  Filled 2015-10-07 (×4): qty 4

## 2015-10-07 MED ORDER — HYDRALAZINE HCL 20 MG/ML IJ SOLN: 5.0000 mg | INTRAMUSCULAR | Status: DC | PRN

## 2015-10-07 MED ORDER — CARVEDILOL 6.25 MG PO TABS
6.2500 mg | ORAL_TABLET | Freq: Every day | ORAL | Status: DC
  Administered 2015-10-07 – 2015-10-09 (×3): 6.25 mg via ORAL
  Filled 2015-10-07 (×3): qty 1

## 2015-10-07 NOTE — H&P (Addendum)
History and Physical    Johnny [name]. HE:[id number] DOB: 08-05-1932 DOA: 10/06/2015  Referring MD/NP/PA:   PCP: [name], MD   Patient coming from:  The patient is coming from home.  At baseline, pt is independent for most of ADL.  Chief Complaint: Shortness of breath, generalized weakness, lightheadedness  HPI: Johnny [name]. is a 82 y.o. male with medical history significant of CVD, s/p of Stent (2001 and 2007), s/p of CABG 2007, hypertension, hyperlipidemia, diabetes mellitus, who presents with shortness of breath, generalized weakness, lightheadedness.  Patient reports that he started having SOB, generalized weakness and lightheadedness today. The symptoms has been constant,, progressively getting worse. He states that he had silent ischemia, and never had chest pain in the  past. Patient denies nausea, vomiting, abdominal pain, diarrhea, symptoms of UTI. No chest pain, cough, fever or chills. No unilateral weakness, numbness in extremities. No vision change or hearing loss.  ED Course: pt was found to have troponin 0.38--> 0.04-->0.48, negative urinalysis, WBC 7.5, bradycardia, normal creatinine, negative chest x-ray for acute abnormalities. IV heparin was started. Patient is placed on telemetry bed for observation. Cardiology, Dr. Radford Pax was consulted.  Review of Systems:   General: no fevers, chills, no changes in body weight, has poor appetite, has fatigue HEENT: no blurry vision, hearing changes or sore throat Pulm: has dyspnea, no coughing, wheezing CV: no chest pain, no palpitations Abd: no nausea, vomiting, abdominal pain, diarrhea, constipation GU: no dysuria, burning on urination, increased urinary frequency, hematuria  Ext: no leg edema Neuro: no unilateral weakness, numbness, or tingling, no vision change or hearing loss Skin: no rash MSK: No muscle spasm, no deformity, no limitation of range of movement in spin Heme: No easy bruising.  Travel  history: No recent long distant travel.  Allergy:  Allergies  Allergen Reactions  . Cozaar   . Ace Inhibitors Itching    Ache, itching..  . Crestor [Rosuvastatin Calcium]     Muscle aches  . Lipitor [Atorvastatin Calcium]     Muscle aches  . Lisinopril     REACTION: rash ?  . Lovastatin     Muscle aches  . Pravastatin     Muscle aches  . Zetia [Ezetimibe] Diarrhea  . Zocor [Simvastatin - High Dose]     Muscle aches  . Latex Rash    Past Medical History:  Diagnosis Date  . CORONARY ARTERY DISEASE Nov 2007   CABG x3   . DIABETES MELLITUS, TYPE II   . Hip fracture, right ([place])   . History of kidney stones   . HYPERLIPIDEMIA    Not able to take statins.  [name] HYPERTENSION   . Kidney stone 2015    Past Surgical History:  Procedure Laterality Date  . CORONARY ANGIOPLASTY WITH STENT PLACEMENT  01/09/2006   Three-vessel CAD  His right coronary artery is extremely tight.  He has moderate to severe disease in the LAD & left circumflex artery.  The LAD is very heavily calcified and the proximal and ostial LAD is not a good target for PTCA.  We will refer him to CVTS for further evaluation  . CORONARY ANGIOPLASTY WITH STENT PLACEMENT  02/14/2000   EF - of around 65-70%./PTCA and stenting of his mid right  coronary artery (4.0 x 18 mm NIR stent to the mid RCA).  He was noted  to have a 50% ostial stenosis at the time of his heart catheterization   . CORONARY ARTERY BYPASS GRAFT  01/17/2006   x3 using a left internal mammary artery graft to left anterior descending coronary artery, saphenous vein graft to obtuse marginal branch, saphenous vein graft to posterior descending branch / Endoscopic vein harvesting from the right leg.  [name] GASTROCNEMIUS RECESSION  03/11/2012   Procedure: GASTROCNEMIUS SLIDE;  Surgeon: Wylene Simmer, MD;  Location: [facility];  Service: Orthopedics;  Laterality: Right;  . HIP FRACTURE SURGERY     right  . TENDON REPAIR  03/11/2012   Procedure:  TENDON REPAIR;  Surgeon: Wylene Simmer, MD;  Location: [facility];  Service: Orthopedics;  Laterality: Right;  Right Tibialis Tendon Repair With Gastrocnemius Recession; Possible PLANTARIS AUTOGRAFT   . TONSILLECTOMY    . TONSILLECTOMY    . VASECTOMY      Social History:  reports that he quit smoking about 50 years ago. His smoking use included Cigarettes. He has never used smokeless tobacco. He reports that he does not drink alcohol or use drugs.  Family History:  Family History  Problem Relation Age of Onset  . Heart attack Father   . Stroke Mother      Prior to Admission medications   Medication Sig Start Date End Date Taking? Authorizing Provider  aspirin 81 MG tablet Take 81 mg by mouth every evening.    Yes Historical Provider, MD  carvedilol (COREG) 6.25 MG tablet Take 3.125-6.25 mg by mouth 2 (two) times daily with a meal. Take 6.25 in the morning and 3.125mg  at night   Yes Historical Provider, MD  Flaxseed, Linseed, POWD Take 1 tablet by mouth daily.    Yes Historical Provider, MD  metFORMIN (GLUCOPHAGE) 500 MG tablet Take 500 mg by mouth daily with breakfast.    Yes Historical Provider, MD  nitroGLYCERIN (NITROSTAT) 0.4 MG SL tablet Place 1 tablet (0.4 mg total) under the tongue every 5 (five) minutes as needed for chest pain. 07/13/14  Yes Thayer Headings, MD  PRESCRIPTION MEDICATION Take 20 mg by mouth every evening. Blood pressure medication is 40 mg tablet but pt takes half of it once daily..unsure of exact name of medication and unable to verify med   Yes Historical Provider, MD  Travoprost, BAK Free, (TRAVATAN) 0.004 % SOLN ophthalmic solution Place 1 drop into both eyes at bedtime.   Yes Historical Provider, MD  Evolocumab (REPATHA SURECLICK) [id number] MG/ML SOAJ Inject 1 pen into the skin every 14 (fourteen) days. Patient not taking: Reported on 10/06/2015 08/16/15   Thayer Headings, MD    Physical Exam: Vitals:   10/07/15 0045 10/07/15 0100 10/07/15 0130 10/07/15  0230  BP:  161/79 149/77 (!) 173/69  Pulse: 63  (!) 54 63  Resp: 15 14 12 18   Temp:    98.4 F (36.9 C)  TempSrc:    Oral  SpO2: 98%  97% 94%  Weight:    67.4 kg (148 lb 9.6 oz)  Height:    5\' 7"  (1.702 m)   General: Not in acute distress HEENT:       Eyes: PERRL, EOMI, no scleral icterus.       ENT: No discharge from the ears and nose, no pharynx injection, no tonsillar enlargement.        Neck: No JVD, no bruit, no mass felt. Heme: No neck lymph node enlargement. Cardiac: S1/S2, RRR, No murmurs, No gallops or rubs. Pulm: No rales, wheezing, rhonchi or rubs. Abd: Soft, nondistended, nontender, no rebound pain, no organomegaly, BS present. GU: No hematuria Ext: No  pitting leg edema bilaterally. 2+DP/PT pulse bilaterally. Musculoskeletal: No joint deformities, No joint redness or warmth, no limitation of ROM in spin. Skin: No rashes.  Neuro: Alert, oriented X3, cranial nerves II-XII grossly intact, moves all extremities normally. Muscle strength 5/5 in all extremities, sensation to light touch intact. Knee reflex 1+ bilaterally. Negative Babinski's sign. Normal finger to nose test. Psych: Patient is not psychotic, no suicidal or hemocidal ideation.  Labs on Admission: I have personally reviewed following labs and imaging studies  CBC:  Recent Labs Lab 10/06/15 2100  WBC 7.5  NEUTROABS 4.9  HGB 14.1  HCT 41.7  MCV 90.8  PLT 123456   Basic Metabolic Panel:  Recent Labs Lab 10/06/15 2100  NA 132*  K 4.4  CL 102  CO2 24  GLUCOSE 198*  BUN 18  CREATININE 0.96  CALCIUM 9.9   GFR: Estimated Creatinine Clearance: 56.4 mL/min (by C-G formula based on SCr of 0.96 mg/dL). Liver Function Tests:  Recent Labs Lab 10/06/15 2100  AST 18  ALT 12*  ALKPHOS 91  BILITOT 0.9  PROT 6.4*  ALBUMIN 3.6   No results for input(s): LIPASE, AMYLASE in the last 168 hours. No results for input(s): AMMONIA in the last 168 hours. Coagulation Profile:  Recent Labs Lab  10/07/15 0057  INR 0.97   Cardiac Enzymes:  Recent Labs Lab 10/05/15 2343 10/06/15 2100 10/07/15 0228  TROPONINI 0.38* 0.04* 0.48*   BNP (last 3 results) No results for input(s): PROBNP in the last 8760 hours. HbA1C: No results for input(s): HGBA1C in the last 72 hours. CBG: No results for input(s): GLUCAP in the last 168 hours. Lipid Profile: No results for input(s): CHOL, HDL, LDLCALC, TRIG, CHOLHDL, LDLDIRECT in the last 72 hours. Thyroid Function Tests: No results for input(s): TSH, T4TOTAL, FREET4, T3FREE, THYROIDAB in the last 72 hours. Anemia Panel: No results for input(s): VITAMINB12, FOLATE, FERRITIN, TIBC, IRON, RETICCTPCT in the last 72 hours. Urine analysis:    Component Value Date/Time   COLORURINE YELLOW 10/06/2015 2046   APPEARANCEUR CLEAR 10/06/2015 2046   LABSPEC 1.007 10/06/2015 2046   PHURINE 7.5 10/06/2015 2046   GLUCOSEU NEGATIVE 10/06/2015 2046   HGBUR TRACE (A) 10/06/2015 2046   BILIRUBINUR NEGATIVE 10/06/2015 2046   [place] NEGATIVE 10/06/2015 2046   PROTEINUR 100 (A) 10/06/2015 2046   NITRITE NEGATIVE 10/06/2015 2046   LEUKOCYTESUR NEGATIVE 10/06/2015 2046   Sepsis Labs: @LABRCNTIP (procalcitonin:4,lacticidven:4) )No results found for this or any previous visit (from the past 240 hour(s)).   Radiological Exams on Admission: Dg Chest 2 View  Result Date: 10/06/2015 CLINICAL DATA:  Hypertension today. EXAM: CHEST  2 VIEW COMPARISON:  02/03/2013. FINDINGS: Stable normal sized heart and post CABG changes. Clear lungs with normal vascularity. Thoracic spine degenerative changes. IMPRESSION: No acute abnormality. Electronically Signed   By: Claudie Revering M.D.   On: 10/06/2015 21:23     EKG: Independently reviewed. Sinus rhythm, QTC 398, low voltage, nonspecific T-wave change.  Assessment/Plan Principal Problem:   NSTEMI (non-ST elevated myocardial infarction) ([place]) Active Problems:   Diabetes mellitus without complication ([place])    Hyperlipidemia   Hypertensive urgency   Coronary atherosclerosis   Elevated troponin   Essential hypertension   NSTEMI and CAD: s/p of sent and CABG. Pt states that he had silent ischemia and never had chest pain in the past. Now he has elevated troponin and some symptoms of shortness of breath, lightheadedness and generalized weakness, consistent with NSTEMI. Card, Dr. Radford Pax was consulted, we see patient  in morning.  - will place on Tele bed for obs - IV heparin - cycle CE q6 x3 and repeat her EKG in the am  - prn Nitroglycerin, Morphine if develops chest pain - increase aspirin dose from 81 to 325 mg daily -continue coreg - pt is allergic to statin   - Risk factor stratification: will check FLP and A1C  - 2d echo - f/u Card recommendations  HLD: Last LDL was 170 on 08/06/15. He is allergic to statin.  He was considered to, but never purchased PCSK9 inhibition, due to co-pay cost.  -Check FLP  DM-II: Last A1c 6.8 on 02/03/13, well controled. Patient is taking metformin at home -SSI -Check A1c  Essential hypertension: Pt has significant elevated blood pressure at 186/75,which have also contributed to elevated troponin but causing demanding ischemia. -Continue Coreg -Start amlodipine 5 mg daily -IV hydralazine when necessary   DVT ppx: on IV heparin Code Status: Full code Family Communication: None at bed side. Disposition Plan:  Anticipate discharge back to previous home environment Consults called:  Card, dr. Radford Pax, will see in AM Admission status: Obs / tele  Date of Service 10/07/2015    Johnny [name] [facility] Pager [phone]  If 7PM-7AM, please contact night-coverage [email or web address] Password [facility] 10/07/2015, 4:46 AM

## 2015-10-07 NOTE — Progress Notes (Signed)
PROGRESS NOTE                                                                                                                                                                                                             Patient Demographics:    Johnny [name], is a 82 y.o. male, DOB - 06/09/1932, HE:[id number]  Admit date - 10/06/2015   Admitting Physician Ivor Costa, MD  Outpatient Primary MD for the patient is [name], MD  LOS - 0  Outpatient Specialists: Dr Acie Fredrickson ( cardiology)  Chief Complaint  Patient presents with  . Hypertension  . Shortness of Breath    with exertion       Brief Narrative   82 year old male with history of CAD (cardiac stent in 2001/2007), CABG in 2007, hypertension, hyperlipidemia, diabetes mellitus presented with generalized weakness, lightheadedness and shortness of breath. Patient found to have progressively elevated troponin and admitted to telemetry with IV heparin. Patient seen by allergy with plan on stress test in the morning.   Subjective:   Reports feeling very weak. Dyspnea improved.   Assessment  & Plan :    Principal Problem:   NSTEMI (non-ST elevated myocardial infarction) ([name]) Continue IV heparin. Cycle troponin. When necessary sublingual nitrate. BNP normal. Continue aspirin, beta blocker. Added Imdur. Patient allergic  to ACEi/ ARB and statin.  Active Problems:   Diabetes mellitus without complication ([facility]) Monitor on sliding scale coverage. A1c of 6.8.  Essential hypertension Continue home medications  Generalized weakness.  PT evaluation once acute issues resolve.  Dyslipidemia. Intolerant to statin. LDL of 214.    Code Status : Full code  Family Communication  : Wife at bedside  Disposition Plan  : Possibly home once acute issues resolve. PT evaluation  Barriers For Discharge : Active symptoms  Consults  : Cardiology  Procedures  :  2-D  echo.  Lexiscan Myoview  DVT Prophylaxis  :  IV heparin  Lab Results  Component Value Date   PLT 262 10/07/2015    Antibiotics  :    Anti-infectives    None        Objective:   Vitals:   10/07/15 0130 10/07/15 0230 10/07/15 0605 10/07/15 1359  BP: 149/77 (!) 173/69 124/69 114/70  Pulse: (!) 54 63 60 (!) 59  Resp: 12 18 16 18   Temp:  98.4 F (36.9 C) 97.9 F (36.6 C) 97.9 F (36.6 C)  TempSrc:  Oral Oral Oral  SpO2: 97% 94% 97% 95%  Weight:  67.4 kg (148 lb 9.6 oz)    Height:  5\' 7"  (1.702 m)      Wt Readings from Last 3 Encounters:  10/07/15 67.4 kg (148 lb 9.6 oz)  07/20/15 70.9 kg (156 lb 6.4 oz)  01/09/15 69.3 kg (152 lb 12.8 oz)     Intake/Output Summary (Last 24 hours) at 10/07/15 1416 Last data filed at 10/07/15 0605  Gross per 24 hour  Intake           246.04 ml  Output              200 ml  Net            46.04 ml     Physical Exam  Gen: not in distress HEENT: no pallor, moist mucosa, supple neck Chest: clear b/l, no added sounds CVS: N S1&S2, no murmurs, rubs or gallop GI: soft, NT, ND, BS+ Musculoskeletal: warm, no edema CNS: AAOX3, non focal    Data Review:    CBC  Recent Labs Lab 10/06/15 2100 10/07/15 0903  WBC 7.5 8.8  HGB 14.1 14.1  HCT 41.7 41.9  PLT 280 262  MCV 90.8 90.7  MCH 30.7 30.5  MCHC 33.8 33.7  RDW 13.0 13.1  LYMPHSABS 1.6  --   MONOABS 0.8  --   EOSABS 0.2  --   BASOSABS 0.0  --     Chemistries   Recent Labs Lab 10/06/15 2100  NA 132*  K 4.4  CL 102  CO2 24  GLUCOSE 198*  BUN 18  CREATININE 0.96  CALCIUM 9.9  AST 18  ALT 12*  ALKPHOS 91  BILITOT 0.9   ------------------------------------------------------------------------------------------------------------------  Recent Labs  10/07/15 0228  CHOL 283*  HDL 38*  LDLCALC 214*  TRIG 154*  CHOLHDL 7.4    Lab Results  Component Value Date   HGBA1C 6.8 (H) 02/03/2013    ------------------------------------------------------------------------------------------------------------------ No results for input(s): TSH, T4TOTAL, T3FREE, THYROIDAB in the last 72 hours.  Invalid input(s): FREET3 ------------------------------------------------------------------------------------------------------------------ No results for input(s): VITAMINB12, FOLATE, FERRITIN, TIBC, IRON, RETICCTPCT in the last 72 hours.  Coagulation profile  Recent Labs Lab 10/07/15 0057  INR 0.97    No results for input(s): DDIMER in the last 72 hours.  Cardiac Enzymes  Recent Labs Lab 10/06/15 2100 10/07/15 0228 10/07/15 0903  TROPONINI 0.04* 0.48* 0.31*   ------------------------------------------------------------------------------------------------------------------    Component Value Date/Time   BNP 88.5 10/07/2015 0228    Inpatient Medications  Scheduled Meds: . amLODipine  5 mg Oral Daily  . aspirin  324 mg Oral Daily  . carvedilol  3.125 mg Oral Q supper  . carvedilol  6.25 mg Oral Q breakfast  . insulin aspart  0-5 Units Subcutaneous QHS  . insulin aspart  0-9 Units Subcutaneous TID WC  . isosorbide mononitrate  30 mg Oral Daily  . latanoprost  1 drop Both Eyes QHS   Continuous Infusions: . sodium chloride 75 mL/hr at 10/07/15 0319  . heparin 950 Units/hr (10/07/15 1009)   PRN Meds:.acetaminophen, hydrALAZINE, morphine injection, nitroGLYCERIN, ondansetron (ZOFRAN) IV  Micro Results No results found for this or any previous visit (from the past 240 hour(s)).  Radiology Reports Dg Chest 2 View  Result Date: 10/06/2015 CLINICAL DATA:  Hypertension today. EXAM: CHEST  2 VIEW COMPARISON:  02/03/2013. FINDINGS: Stable normal sized heart and  post CABG changes. Clear lungs with normal vascularity. Thoracic spine degenerative changes. IMPRESSION: No acute abnormality. Electronically Signed   By: Claudie Revering M.D.   On: 10/06/2015 21:23    Time Spent in  minutes  25   Louellen Molder M.D on 10/07/2015 at 2:16 PM  Between 7am to 7pm - Pager - [phone]  After 7pm go to [email or web address] - password [facility]  [facility] -  Office  [phone]

## 2015-10-07 NOTE — ED Notes (Signed)
MD at bedside. 

## 2015-10-07 NOTE — Progress Notes (Signed)
PT/OT Cancellation Note  Patient Details Name: Johnny [name]. MRN: [id number] DOB: [birth year]   Cancelled Treatment:    Reason Eval/Treat Not Completed: Patient not medically ready; Pt with elevated troponin and with cardiology consult pending; will attempt PT/OT again next day or as schedule permits;    [facility] 10/07/2015, 8:43 AM

## 2015-10-07 NOTE — Consult Note (Signed)
Cardiology Consultation     Patient ID: Chancy, Smigiel [id number], Jan 02, 1933 Admit date: 10/06/2015 Date of Consult: 10/07/2015  Primary Physician: Dr. Loleta Books Primary Cardiologist: Dr. [name] Referring Physician: Dr. Blaine Hamper  Chief Complaint: Shortness of breath and fatigability Reason for Consultation:  CAD, exertional shortness of breath, and mildly positive troponin  HPI:  [name] is an 82 year old gentleman who has a history of CAD  and underwent initial intervention with PTCA/stenting of his mid RCA in December 2001.  He is status post CABG x3 surgery in 12/27/2005 with a LIMA graft to the LAD, a vein graft to the obtuse marginal, and a vein graft to the PDA.  He has a history of hypertension, type 2 diabetes mellitus, and hyperlipidemia. He was considered for the possibility of initiating PCSK-9  inhibition therapy for his hyperlipidemia due to statin and Zetia intolerance .  However, he never pursued this due to significant cost.  He has a history of kidney stones, hip fracture, and remote superficial DVT.   He denies any episodes of chest pain but admits to silent ischemia.  His nuclear perfusion study was in March 2015 which was done at the [facility] which showed an ejection fraction of 68% and normal perfusion.  He is followed by Dr. Acie Fredrickson in the office with his last visit being in May 2017.  According to his wife, the patient has developed more exertional shortness of breath with activity over the past several weeks.  Patient denies any prior history of chest pain.  Admits to only experiencing silent ischemia.  Because of increasing symptoms with more weakness and fatigability.  He presented to the hospital last evening and was admitted by Dr. Blaine Hamper for further evaluation.  On presentation, he was hypertensive with systolic blood pressure greater than 160.  Troponins have been minimally positive at 0.48.  Cardiology consult dictation is  requested.   Past Medical History:  Diagnosis Date  . CORONARY ARTERY DISEASE Nov 2007   CABG x3   . DIABETES MELLITUS, TYPE II   . Hip fracture, right ([place])   . History of kidney stones   . HYPERLIPIDEMIA    Not able to take statins.  [name] HYPERTENSION   . Kidney stone 2015   Past Surgical History:  Procedure Laterality Date  . CORONARY ANGIOPLASTY WITH STENT PLACEMENT  01/09/2006   Three-vessel CAD  His right coronary artery is extremely tight.  He has moderate to severe disease in the LAD & left circumflex artery.  The LAD is very heavily calcified and the proximal and ostial LAD is not a good target for PTCA.  We will refer him to CVTS for further evaluation  . CORONARY ANGIOPLASTY WITH STENT PLACEMENT  02/14/2000   EF - of around 65-70%./PTCA and stenting of his mid right  coronary artery (4.0 x 18 mm NIR stent to the mid RCA).  He was noted  to have a 50% ostial stenosis at the time of his heart catheterization   . CORONARY ARTERY BYPASS GRAFT  01/17/2006   x3 using a left internal mammary artery graft to left anterior descending coronary artery, saphenous vein graft to obtuse marginal branch, saphenous vein graft to posterior descending branch / Endoscopic vein harvesting from the right leg.  [name] GASTROCNEMIUS RECESSION  03/11/2012   Procedure: GASTROCNEMIUS SLIDE;  Surgeon: Wylene Simmer, MD;  Location: [place];  Service: Orthopedics;  Laterality: Right;  . HIP FRACTURE SURGERY  right  . TENDON REPAIR  03/11/2012   Procedure: TENDON REPAIR;  Surgeon: Wylene Simmer, MD;  Location: [place];  Service: Orthopedics;  Laterality: Right;  Right Tibialis Tendon Repair With Gastrocnemius Recession; Possible PLANTARIS AUTOGRAFT   . TONSILLECTOMY    . TONSILLECTOMY    . VASECTOMY      FAMHx: Family History  Problem Relation Age of Onset  . Heart attack Father   . Stroke Mother     SOCHx:  reports that he quit smoking about 50 years ago. His smoking  use included Cigarettes. He has never used smokeless tobacco. He reports that he does not drink alcohol or use drugs.  ALLERGIES: Allergies  Allergen Reactions  . Cozaar   . Ace Inhibitors Itching    Ache, itching..  . Crestor [Rosuvastatin Calcium]     Muscle aches  . Lipitor [Atorvastatin Calcium]     Muscle aches  . Lisinopril     REACTION: rash ?  . Lovastatin     Muscle aches  . Pravastatin     Muscle aches  . Zetia [Ezetimibe] Diarrhea  . Zocor [Simvastatin - High Dose]     Muscle aches  . Latex Rash     HOME MEDICATIONS: Prescriptions Prior to Admission  Medication Sig Dispense Refill Last Dose  . aspirin 81 MG tablet Take 81 mg by mouth every evening.    10/05/2015 at Unknown time  . carvedilol (COREG) 6.25 MG tablet Take 3.125-6.25 mg by mouth 2 (two) times daily with a meal. Take 6.25 in the morning and 3.'125mg'$  at night   10/06/2015 at 1915  . Flaxseed, Linseed, POWD Take 1 tablet by mouth daily.    10/06/2015 at Unknown time  . metFORMIN (GLUCOPHAGE-XR) 500 MG 24 hr tablet Take 500 mg by mouth daily with breakfast.   10/06/2015 at Unknown time  . nitroGLYCERIN (NITROSTAT) 0.4 MG SL tablet Place 1 tablet (0.4 mg total) under the tongue every 5 (five) minutes as needed for chest pain. 25 tablet 6 unknown  . Travoprost, BAK Free, (TRAVATAN) 0.004 % SOLN ophthalmic solution Place 1 drop into both eyes at bedtime.   10/05/2015 at Unknown time  . valsartan (DIOVAN) 40 MG tablet Take 20 mg by mouth daily.   10/05/2015 at Unknown time  . Evolocumab (REPATHA SURECLICK) 678 MG/ML SOAJ Inject 1 pen into the skin every 14 (fourteen) days. (Patient not taking: Reported on 10/06/2015) 2 pen 11 Not Taking at Unknown time    HOSPITAL MEDICATIONS: . amLODipine  5 mg Oral Daily  . aspirin  324 mg Oral Daily  . carvedilol  3.125 mg Oral Q supper  . carvedilol  6.25 mg Oral Q breakfast  . insulin aspart  0-5 Units Subcutaneous QHS  . insulin aspart  0-9 Units Subcutaneous TID WC  .  latanoprost  1 drop Both Eyes QHS    ROS General: Negative; No fevers, chills, or night sweats;  HEENT: Negative; No changes in vision or hearing, sinus congestion, difficulty swallowing Pulmonary: Negative; No cough, wheezing, shortness of breath, hemoptysis Cardiovascular: see history of present illness GI: Negative; No nausea, vomiting, diarrhea, or abdominal pain GU: Negative; No dysuria, hematuria, or difficulty voiding Musculoskeletal: Negative; no myalgias, joint pain, or weakness Hematologic/Oncology: Negative; no easy bruising, bleeding Endocrine: Positive for diabetes mellitus Neuro: Negative; no changes in balance, headaches Skin: Negative; No rashes or skin lesions Psychiatric: Negative; No behavioral problems, depression Sleep: Negative; No snoring, daytime sleepiness, hypersomnolence, bruxism, restless legs, hypnogognic hallucinations,  no cataplexy Other comprehensive 14 point system review is negative.    VITALS: Blood pressure 124/69, pulse 60, temperature 97.9 F (36.6 C), temperature source Oral, resp. rate 16, height '5\' 7"'$  (1.702 m), weight 148 lb 9.6 oz (67.4 kg), SpO2 97 %.  PHYSICAL EXAM: General appearance: alert, cooperative and no distress Neck: no adenopathy, no JVD, supple, symmetrical, trachea midline and thyroid not enlarged, symmetric, no tenderness/mass/nodules Lungs: clear to auscultation bilaterally Heart: RRR 1/6 sem Abdomen: soft, non-tender; bowel sounds normal; no masses,  no organomegaly Extremities: no edema, redness or tenderness in the calves or thighs Pulses: 2+ and symmetric Neurologic: Grossly normal  ECG (independently read by me): NSR at 61 without diagnostic ST  T changes.  LABS: Results for orders placed or performed during the hospital encounter of 10/06/15 (from the past 48 hour(s))  Troponin I     Status: Abnormal   Collection Time: 10/05/15 11:43 PM  Result Value Ref Range   Troponin I 0.38 (HH) <0.03 ng/mL    Comment:  CRITICAL VALUE NOTED.  VALUE IS CONSISTENT WITH PREVIOUSLY REPORTED AND CALLED VALUE.  Urinalysis, Routine w reflex microscopic     Status: Abnormal   Collection Time: 10/06/15  8:46 PM  Result Value Ref Range   Color, Urine YELLOW YELLOW   APPearance CLEAR CLEAR   Specific Gravity, Urine 1.007 1.005 - 1.030   pH 7.5 5.0 - 8.0   Glucose, UA NEGATIVE NEGATIVE mg/dL   Hgb urine dipstick TRACE (A) NEGATIVE   Bilirubin Urine NEGATIVE NEGATIVE   Ketones, ur NEGATIVE NEGATIVE mg/dL   Protein, ur 100 (A) NEGATIVE mg/dL   Nitrite NEGATIVE NEGATIVE   Leukocytes, UA NEGATIVE NEGATIVE  Urine microscopic-add on     Status: Abnormal   Collection Time: 10/06/15  8:46 PM  Result Value Ref Range   Squamous Epithelial / LPF 0-5 (A) NONE SEEN   WBC, UA 0-5 0 - 5 WBC/hpf   RBC / HPF 0-5 0 - 5 RBC/hpf   Bacteria, UA RARE (A) NONE SEEN  CBC with Differential     Status: None   Collection Time: 10/06/15  9:00 PM  Result Value Ref Range   WBC 7.5 4.0 - 10.5 K/uL   RBC 4.59 4.22 - 5.81 MIL/uL   Hemoglobin 14.1 13.0 - 17.0 g/dL   HCT 41.7 39.0 - 52.0 %   MCV 90.8 78.0 - 100.0 fL   MCH 30.7 26.0 - 34.0 pg   MCHC 33.8 30.0 - 36.0 g/dL   RDW 13.0 11.5 - 15.5 %   Platelets 280 150 - 400 K/uL   Neutrophils Relative % 66 %   Neutro Abs 4.9 1.7 - 7.7 K/uL   Lymphocytes Relative 21 %   Lymphs Abs 1.6 0.7 - 4.0 K/uL   Monocytes Relative 11 %   Monocytes Absolute 0.8 0.1 - 1.0 K/uL   Eosinophils Relative 3 %   Eosinophils Absolute 0.2 0.0 - 0.7 K/uL   Basophils Relative 1 %   Basophils Absolute 0.0 0.0 - 0.1 K/uL  Troponin I     Status: Abnormal   Collection Time: 10/06/15  9:00 PM  Result Value Ref Range   Troponin I 0.04 (HH) <0.03 ng/mL    Comment: CRITICAL RESULT CALLED TO, READ BACK BY AND VERIFIED WITH: Alanson Aly RN @ 2150 ON 10/06/15 [place] C DAVIS   Comprehensive metabolic panel     Status: Abnormal   Collection Time: 10/06/15  9:00 PM  Result Value Ref Range  Sodium 132 (L) 135 - 145  mmol/L   Potassium 4.4 3.5 - 5.1 mmol/L    Comment: SLIGHT HEMOLYSIS   Chloride 102 101 - 111 mmol/L   CO2 24 22 - 32 mmol/L   Glucose, Bld 198 (H) 65 - 99 mg/dL   BUN 18 6 - 20 mg/dL   Creatinine, Ser 0.96 0.61 - 1.24 mg/dL   Calcium 9.9 8.9 - 10.3 mg/dL   Total Protein 6.4 (L) 6.5 - 8.1 g/dL   Albumin 3.6 3.5 - 5.0 g/dL   AST 18 15 - 41 U/L   ALT 12 (L) 17 - 63 U/L   Alkaline Phosphatase 91 38 - 126 U/L   Total Bilirubin 0.9 0.3 - 1.2 mg/dL   GFR calc non Af Amer >60 >60 mL/min   GFR calc Af Amer >60 >60 mL/min    Comment: (NOTE) The eGFR has been calculated using the CKD EPI equation. This calculation has not been validated in all clinical situations. eGFR's persistently <60 mL/min signify possible Chronic Kidney Disease.    Anion gap 6 5 - 15  Protime-INR     Status: None   Collection Time: 10/07/15 12:57 AM  Result Value Ref Range   Prothrombin Time 12.9 11.4 - 15.2 seconds   INR 0.97   APTT     Status: Abnormal   Collection Time: 10/07/15 12:58 AM  Result Value Ref Range   aPTT 37 (H) 24 - 36 seconds    Comment:        IF BASELINE aPTT IS ELEVATED, SUGGEST PATIENT RISK ASSESSMENT BE USED TO DETERMINE APPROPRIATE ANTICOAGULANT THERAPY.   Lipid panel     Status: Abnormal   Collection Time: 10/07/15  2:28 AM  Result Value Ref Range   Cholesterol 283 (H) 0 - 200 mg/dL   Triglycerides 154 (H) <150 mg/dL   HDL 38 (L) >40 mg/dL   Total CHOL/HDL Ratio 7.4 RATIO   VLDL 31 0 - 40 mg/dL   LDL Cholesterol 214 (H) 0 - 99 mg/dL    Comment:        Total Cholesterol/HDL:CHD Risk Coronary Heart Disease Risk Table                     Men   Women  1/2 Average Risk   3.4   3.3  Average Risk       5.0   4.4  2 X Average Risk   9.6   7.1  3 X Average Risk  23.4   11.0        Use the calculated Patient Ratio above and the CHD Risk Table to determine the patient's CHD Risk.        ATP III CLASSIFICATION (LDL):  <100     mg/dL   Optimal  100-129  mg/dL   Near or Above                     Optimal  130-159  mg/dL   Borderline  160-189  mg/dL   High  >190     mg/dL   Very High Performed at [facility]   Troponin I (q 6hr x 3)     Status: Abnormal   Collection Time: 10/07/15  2:28 AM  Result Value Ref Range   Troponin I 0.48 (HH) <0.03 ng/mL    Comment: CRITICAL VALUE NOTED.  VALUE IS CONSISTENT WITH PREVIOUSLY REPORTED AND CALLED VALUE.  Brain natriuretic peptide  Status: None   Collection Time: 10/07/15  2:28 AM  Result Value Ref Range   B Natriuretic Peptide 88.5 0.0 - 100.0 pg/mL  Glucose, capillary     Status: Abnormal   Collection Time: 10/07/15  7:59 AM  Result Value Ref Range   Glucose-Capillary 132 (H) 65 - 99 mg/dL  Heparin level (unfractionated)     Status: Abnormal   Collection Time: 10/07/15  9:03 AM  Result Value Ref Range   Heparin Unfractionated 0.27 (L) 0.30 - 0.70 IU/mL    Comment:        IF HEPARIN RESULTS ARE BELOW EXPECTED VALUES, AND PATIENT DOSAGE HAS BEEN CONFIRMED, SUGGEST FOLLOW UP TESTING OF ANTITHROMBIN III LEVELS.   CBC     Status: None   Collection Time: 10/07/15  9:03 AM  Result Value Ref Range   WBC 8.8 4.0 - 10.5 K/uL   RBC 4.62 4.22 - 5.81 MIL/uL   Hemoglobin 14.1 13.0 - 17.0 g/dL   HCT 03.3 77.8 - 07.8 %   MCV 90.7 78.0 - 100.0 fL   MCH 30.5 26.0 - 34.0 pg   MCHC 33.7 30.0 - 36.0 g/dL   RDW 12.7 81.0 - 86.0 %   Platelets 262 150 - 400 K/uL  Troponin I (q 6hr x 3)     Status: Abnormal   Collection Time: 10/07/15  9:03 AM  Result Value Ref Range   Troponin I 0.31 (HH) <0.03 ng/mL    Comment: CRITICAL VALUE NOTED.  VALUE IS CONSISTENT WITH PREVIOUSLY REPORTED AND CALLED VALUE.  Glucose, capillary     Status: Abnormal   Collection Time: 10/07/15 11:51 AM  Result Value Ref Range   Glucose-Capillary 125 (H) 65 - 99 mg/dL    IMAGING: Dg Chest 2 View  Result Date: 10/06/2015 CLINICAL DATA:  Hypertension today. EXAM: CHEST  2 VIEW COMPARISON:  02/03/2013. FINDINGS: Stable normal sized heart and  post CABG changes. Clear lungs with normal vascularity. Thoracic spine degenerative changes. IMPRESSION: No acute abnormality. Electronically Signed   By: Beckie Salts M.D.   On: 10/06/2015 21:23    IMPRESSION:  1.  CAD: The patient is status post PCI to his mid RCA in 2001 and CABG surgery 3 in 2007.  He's not had a cardiac catheterization since his bypass surgery.  His last nuclear perfusion study in March 2015 at the [facility] revealed normal perfusion and LV function.  The patient presented hypertensive and amlodipine was added to his medical regimen.  We'll also empirically add low-dose nitrate with isosorbide mononitrate 30 mg daily to take in addition to his carvedilol.  We will cycle enzymes and obtain follow-up ECG.  If there is no significant increase in troponins and ECG remained stable.  We will schedule him for a Lexiscan Myoview study tomorrow morning for further ischemic evaluation.  Alternatively, if troponins increase or ECG changes developed.  Definitive cardiac catheterization will be recommended.  2.  Essential hypertension: Patient presented with systolic blood pressure greater than 160 despite being on carvedilol and very low-dose valsartan.  Amlodipine was added to his medical regimen.  Will also add nitrates.  If renal function remained stable consider titration of very low-dose valsartan.  3.  Hyperlipidemia: Apparently the patient is intolerant to statins and Zetia.  He never pursued PCSK9 inhibition, due to co-pay cost.  4. Type 2 diabetes mellitus on metformin.  Recommend hemoglobin A1c evaluation  Will keep NPO past MN for possible nuclear vs cath.  Attending:  Clovis Pu  Claiborne Billings, MD, [facility] 10/07/2015 11:56 AM

## 2015-10-07 NOTE — ED Notes (Signed)
Hospitalist at bedside 

## 2015-10-07 NOTE — Progress Notes (Addendum)
ANTICOAGULATION CONSULT NOTE - Follow Up Consult  Pharmacy Consult for heparin Indication: chest pain/ACS  Allergies  Allergen Reactions  . Cozaar   . Ace Inhibitors Itching    Ache, itching..  . Crestor [Rosuvastatin Calcium]     Muscle aches  . Lipitor [Atorvastatin Calcium]     Muscle aches  . Lisinopril     REACTION: rash ?  . Lovastatin     Muscle aches  . Pravastatin     Muscle aches  . Zetia [Ezetimibe] Diarrhea  . Zocor [Simvastatin - High Dose]     Muscle aches  . Latex Rash    Patient Measurements: Height: 5\' 7"  (170.2 cm) Weight: 148 lb 9.6 oz (67.4 kg) IBW/kg (Calculated) : 66.1 Heparin Dosing Weight: 67 kg  Vital Signs: Temp: 97.9 F (36.6 C) (08/13 0605) Temp Source: Oral (08/13 0605) BP: 124/69 (08/13 0605) Pulse Rate: 60 (08/13 0605)  Labs:  Recent Labs  10/06/15 2100 10/07/15 0057 10/07/15 0058 10/07/15 0228 10/07/15 0903  HGB 14.1  --   --   --  14.1  HCT 41.7  --   --   --  41.9  PLT 280  --   --   --  262  APTT  --   --  37*  --   --   LABPROT  --  12.9  --   --   --   INR  --  0.97  --   --   --   HEPARINUNFRC  --   --   --   --  0.27*  CREATININE 0.96  --   --   --   --   TROPONINI 0.04*  --   --  0.48* 0.31*    Estimated Creatinine Clearance: 56.4 mL/min (by C-G formula based on SCr of 0.96 mg/dL).   Assessment: Patient is an 82 y.o. M with hx CAD, s/p CABG in 2007 presented to the ED on 8/12 with c/o SOB.  He was found to have elevated troponin.  Heparin drip started on admission for r/o ACS.  Today, 10/07/2015: - First heparin level now back slightly subtherapeutic at 0.27 - Troponin elevated but down to 0.31 - cbc stable - no bleeding documented   Goal of Therapy:  Heparin level 0.3-0.7 units/ml Monitor platelets by anticoagulation protocol: Yes   Plan:  - increase heparin drip to 950 units/hr - recheck another heparin level in 8 hours - monitor for s/s bleeding  Johnny [name] P 10/07/2015,9:58  AM _______________________  Adden:  Heparin level now back therapeutic at 0.38 after rate increased to 950 units/hr.  Plan for stress test on 8/14. - continue heparin drip at current rate. - f/u with AM heparin level and adjust rate if needed Johnny [name], PharmD, BCPS 10/07/2015 7:26 PM

## 2015-10-07 NOTE — Progress Notes (Signed)
PHARMACIST - PHYSICIAN ORDER COMMUNICATION  CONCERNING: P&T Medication Policy on Herbal Medications  DESCRIPTION:  This patient's order for:  Flaxseed  has been noted.  This product(s) is classified as an "herbal" or natural product. Due to a lack of definitive safety studies or FDA approval, nonstandard manufacturing practices, plus the potential risk of unknown drug-drug interactions while on inpatient medications, the Pharmacy and Therapeutics Committee does not permit the use of "herbal" or natural products of this type within [facility].   ACTION TAKEN: The pharmacy department is unable to verify this order at this time and your patient has been informed of this safety policy. Please reevaluate patient's clinical condition at discharge and address if the herbal or natural product(s) should be resumed at that time.  Leone Haven, PharmD

## 2015-10-07 NOTE — Progress Notes (Signed)
ANTICOAGULATION CONSULT NOTE - Initial Consult  Pharmacy Consult for Heparin Indication: chest pain/ACS  Allergies  Allergen Reactions  . Cozaar   . Ace Inhibitors Itching    Ache, itching..  . Crestor [Rosuvastatin Calcium]     Muscle aches  . Lipitor [Atorvastatin Calcium]     Muscle aches  . Lisinopril     REACTION: rash ?  . Lovastatin     Muscle aches  . Pravastatin     Muscle aches  . Zetia [Ezetimibe] Diarrhea  . Zocor [Simvastatin - High Dose]     Muscle aches  . Latex Rash    Patient Measurements: Height: 5\' 8"  (172.7 cm) Weight: 148 lb (67.1 kg) IBW/kg (Calculated) : 68.4 Heparin Dosing Weight: actual body weight  Vital Signs: Temp: 96.4 F (35.8 C) (08/12 2002) Temp Source: Axillary (08/12 2002) BP: 117/56 (08/13 0030) Pulse Rate: 58 (08/13 0030)  Labs:  Recent Labs  10/05/15 2343 10/06/15 2100  HGB  --  14.1  HCT  --  41.7  PLT  --  280  CREATININE  --  0.96  TROPONINI 0.38* 0.04*    Estimated Creatinine Clearance: 57.3 mL/min (by C-G formula based on SCr of 0.96 mg/dL).   Medical History: Past Medical History:  Diagnosis Date  . Arthritis   . CORONARY ARTERY DISEASE Nov 2007   CABG x3   . DIABETES MELLITUS, TYPE II   . Hip fracture, right ([place])   . History of kidney stones   . HYPERLIPIDEMIA    Not able to take statins.  [name] HYPERTENSION   . Kidney stone 2015    Assessment:  82 yr male with PMH significant for CAD, CABG (12/2005), DM. HTN and hyperlipidemia presents with hypertension, dizziness.  Troponin = 0.04  Pharmacy consulted to start IV heparin for r/o ACS/STEMI  Patient on no oral anticoagulation PTA  CBC - ok  Goal of Therapy:  Heparin level 0.3-0.7 units/ml Monitor platelets by anticoagulation protocol: Yes   Plan:   Obtain baseline aPTT & PT/INR  Heparin 3500 unit IV bolus x 1 followed by heparin infusion @ 800 units/hr  Check heparin level 8 hr after heparin started  Follow heparin level & CBC daily    Mireyah Chervenak, Toribio Harbour, PharmD 10/07/2015,1:03 AM

## 2015-10-07 NOTE — Progress Notes (Signed)
*  PRELIMINARY RESULTS* Echocardiogram 2D Echocardiogram has been performed.  Johnny [name] 10/07/2015, 3:14 PM

## 2015-10-08 ENCOUNTER — Ambulatory Visit (HOSPITAL_COMMUNITY)
Admit: 2015-10-08 | Discharge: 2015-10-08 | Disposition: A | Discharge status: Home / Self Care | Payer: Medicare Other | Attending: Nurse Practitioner | Admitting: Nurse Practitioner

## 2015-10-08 DIAGNOSIS — R079 Chest pain, unspecified: Secondary | ICD-10-CM | POA: Insufficient documentation

## 2015-10-08 DIAGNOSIS — R0609 Other forms of dyspnea: Secondary | ICD-10-CM

## 2015-10-08 DIAGNOSIS — R0600 Dyspnea, unspecified: Secondary | ICD-10-CM | POA: Diagnosis present

## 2015-10-08 LAB — HEMOGLOBIN A1C
HEMOGLOBIN A1C: 7.4 % — AB (ref 4.8–5.6)
Mean Plasma Glucose: 166 mg/dL

## 2015-10-08 LAB — NM MYOCAR MULTI W/SPECT W/WALL MOTION / EF
CHL CUP STRESS STAGE 1 DBP: 58 mmHg
CHL CUP STRESS STAGE 1 GRADE: 0 %
CHL CUP STRESS STAGE 1 HR: 55 {beats}/min
CHL CUP STRESS STAGE 1 SBP: 149 mmHg
CHL CUP STRESS STAGE 2 HR: 55 {beats}/min
CHL CUP STRESS STAGE 2 SPEED: 0 mph
CHL CUP STRESS STAGE 3 DBP: 54 mmHg
CHL CUP STRESS STAGE 3 SBP: 139 mmHg
CHL CUP STRESS STAGE 3 SPEED: 0 mph
CHL CUP STRESS STAGE 4 DBP: 67 mmHg
CHL CUP STRESS STAGE 4 GRADE: 0 %
CSEPPMHR: 55 %
Estimated workload: 1 METS
LVDIAVOL: 74 mL (ref 62–150)
LVSYSVOL: 30 mL
Peak BP: 149 mmHg
Peak HR: 77 {beats}/min
RATE: 0.34
Rest HR: 50 {beats}/min
SDS: 5
SRS: 8
SSS: 13
Stage 1 Speed: 0 mph
Stage 2 Grade: 0 %
Stage 3 Grade: 0 %
Stage 3 HR: 79 {beats}/min
Stage 4 HR: 77 {beats}/min
Stage 4 SBP: 149 mmHg
Stage 4 Speed: 0 mph
TID: 1.42

## 2015-10-08 LAB — GLUCOSE, CAPILLARY
GLUCOSE-CAPILLARY: 98 mg/dL (ref 65–99)
GLUCOSE-CAPILLARY: 158 mg/dL — AB (ref 65–99)
GLUCOSE-CAPILLARY: 203 mg/dL — AB (ref 65–99)
GLUCOSE-CAPILLARY: 174 mg/dL — AB (ref 65–99)

## 2015-10-08 LAB — CBC
HCT: 38.8 % — ABNORMAL LOW (ref 39.0–52.0)
Hemoglobin: 12.9 g/dL — ABNORMAL LOW (ref 13.0–17.0)
MCH: 30.7 pg (ref 26.0–34.0)
MCHC: 33.2 g/dL (ref 30.0–36.0)
MCV: 92.4 fL (ref 78.0–100.0)
PLATELETS: 256 10*3/uL (ref 150–400)
RBC: 4.2 MIL/uL — AB (ref 4.22–5.81)
RDW: 13.3 % (ref 11.5–15.5)
WBC: 7.5 10*3/uL (ref 4.0–10.5)

## 2015-10-08 LAB — HEPARIN LEVEL (UNFRACTIONATED)
HEPARIN UNFRACTIONATED: 0.36 [IU]/mL (ref 0.30–0.70)
Heparin Unfractionated: 0.17 IU/mL — ABNORMAL LOW (ref 0.30–0.70)

## 2015-10-08 LAB — URINE CULTURE

## 2015-10-08 MED ORDER — TECHNETIUM TC 99M TETROFOSMIN IV KIT
10.0000 | PACK | Freq: Once | INTRAVENOUS | Status: AC | PRN
  Administered 2015-10-08: 10 via INTRAVENOUS

## 2015-10-08 MED ORDER — REGADENOSON 0.4 MG/5ML IV SOLN: 0.4000 mg | Freq: Once | INTRAVENOUS | Status: DC

## 2015-10-08 MED ORDER — SODIUM CHLORIDE 0.9 % WEIGHT BASED INFUSION
1.0000 mL/kg/h | INTRAVENOUS | Status: DC
Start: 2015-10-09 — End: 2015-10-09
  Administered 2015-10-09: 1 mL/kg/h via INTRAVENOUS

## 2015-10-08 MED ORDER — TECHNETIUM TC 99M TETROFOSMIN IV KIT
30.0000 | PACK | Freq: Once | INTRAVENOUS | Status: AC | PRN
  Administered 2015-10-08: 30 via INTRAVENOUS

## 2015-10-08 MED ORDER — HEPARIN (PORCINE) IN NACL 100-0.45 UNIT/ML-% IJ SOLN
1150.0000 [IU]/h | INTRAMUSCULAR | Status: DC
  Administered 2015-10-08: 1150 [IU]/h via INTRAVENOUS

## 2015-10-08 MED ORDER — SODIUM CHLORIDE 0.9 % WEIGHT BASED INFUSION
3.0000 mL/kg/h | INTRAVENOUS | Status: DC
  Administered 2015-10-09: 3 mL/kg/h via INTRAVENOUS

## 2015-10-08 MED ORDER — REGADENOSON 0.4 MG/5ML IV SOLN
INTRAVENOUS | Status: AC
  Filled 2015-10-08: qty 5

## 2015-10-08 MED ORDER — KETOROLAC TROMETHAMINE 15 MG/ML IJ SOLN: 15.0000 mg | Freq: Once | INTRAMUSCULAR | Status: DC

## 2015-10-08 MED ORDER — SODIUM CHLORIDE 0.9 % IV SOLN: 250.0000 mL | INTRAVENOUS | Status: DC | PRN

## 2015-10-08 MED ORDER — SODIUM CHLORIDE 0.9% FLUSH: 3.0000 mL | INTRAVENOUS | Status: DC | PRN

## 2015-10-08 MED ORDER — SODIUM CHLORIDE 0.9% FLUSH
3.0000 mL | Freq: Two times a day (BID) | INTRAVENOUS | Status: DC
  Administered 2015-10-08: 3 mL via INTRAVENOUS

## 2015-10-08 NOTE — Progress Notes (Signed)
ANTICOAGULATION CONSULT NOTE - Follow Up Consult  Pharmacy Consult for heparin Indication: chest pain/ACS  Allergies  Allergen Reactions  . Cozaar   . Ace Inhibitors Itching    Ache, itching..  . Crestor [Rosuvastatin Calcium]     Muscle aches  . Lipitor [Atorvastatin Calcium]     Muscle aches  . Lisinopril     REACTION: rash ?  . Lovastatin     Muscle aches  . Pravastatin     Muscle aches  . Zetia [Ezetimibe] Diarrhea  . Zocor [Simvastatin - High Dose]     Muscle aches  . Latex Rash    Patient Measurements: Height: 5\' 7"  (170.2 cm) Weight: 148 lb 9.6 oz (67.4 kg) IBW/kg (Calculated) : 66.1 Heparin Dosing Weight: 67 kg  Vital Signs: Temp: 98.4 F (36.9 C) (08/14 0603) Temp Source: Oral (08/14 0603) BP: 116/67 (08/14 0603) Pulse Rate: 54 (08/14 0603)  Labs:  Recent Labs  10/06/15 2100 10/07/15 0057 10/07/15 0058  10/07/15 0903 10/07/15 1429 10/07/15 1812 10/08/15 0547  HGB 14.1  --   --   --  14.1  --   --  12.9*  HCT 41.7  --   --   --  41.9  --   --  38.8*  PLT 280  --   --   --  262  --   --  256  APTT  --   --  37*  --   --   --   --   --   LABPROT  --  12.9  --   --   --   --   --   --   INR  --  0.97  --   --   --   --   --   --   HEPARINUNFRC  --   --   --   --  0.27*  --  0.38 0.36  CREATININE 0.96  --   --   --   --   --   --   --   TROPONINI 0.04*  --   --   < > 0.31* 0.37* 0.38*  --   < > = values in this interval not displayed.  Estimated Creatinine Clearance: 56.4 mL/min (by C-G formula based on SCr of 0.96 mg/dL).   Assessment: Patient is an 82 y.o. M with hx CAD, s/p CABG in 2007 presented to the ED on 8/12 with c/o SOB.  He was found to have elevated troponin.  Heparin drip started on admission for r/o ACS.  Today, 10/08/2015:  Heparin level therapeutic (0.36) on 950 units/hr  Most recent troponin elevated and slightly increased to 0.38  H/H slightly decreased; Plts stable  No bleeding documented  Concurrent aspirin 325mg   daily noted   Goal of Therapy:  Heparin level 0.3-0.7 units/ml Monitor platelets by anticoagulation protocol: Yes   Plan:   Continue heparin drip at 950 units/hr  Daily heparin level and CBC  Monitor for s/s bleeding  Plan for stress test today  Peggyann Juba, PharmD, BCPS Pager: [phone] 10/08/2015,7:08 AM

## 2015-10-08 NOTE — Progress Notes (Signed)
   Johnny [name]. presented for a Lexiscan cardiolite today.  Day 1 of 1.  No immediate complications.  Stress imaging is pending at this time.  Charlie Pitter, PA-C 10/08/2015, 11:30 AM

## 2015-10-08 NOTE — Progress Notes (Signed)
OT Cancellation Note  Patient Details Name: Johnny [name]. MRN: [id number] DOB: [birth year]   Cancelled Treatment:    Reason Eval/Treat Not Completed: Patient at procedure or test/ unavailable. Pt has been transferred to [facility] for cardiology test(s). Will attempt eval at a later time as schedule allows.  Almon Register [id number] 10/08/2015, 9:48 AM

## 2015-10-08 NOTE — Progress Notes (Signed)
Reviewed Myoview with Dr Acie Fredrickson, pt's cardiologist. "intermediate" Myoview scan. Pt does not usually have chest pain. With admission symptoms, Troponin elevation, and abnormal Myoview will proceed with diagnostic cath. Discussed with opt and he is agreeable. Case scheduled for 9 am tomorrow with Dr Irish Lack.   Kerin Ransom PA-C 10/08/2015 4:17 PM

## 2015-10-08 NOTE — Progress Notes (Signed)
PROGRESS NOTE                                                                                                                                                                                                             Patient Demographics:    Johnny [name], is a 82 y.o. male, DOB - [birth year], [id number]  Admit date - 10/06/2015   Admitting Physician Ivor Costa, MD  Outpatient Primary MD for the patient is [name], MD  LOS - 1  Outpatient Specialists: Dr Acie Fredrickson ( cardiology)  Chief Complaint  Patient presents with  . Hypertension  . Shortness of Breath    with exertion       Brief Narrative   82 year old male with history of CAD (cardiac stent in 2001/2007), CABG in 2007, hypertension, hyperlipidemia, diabetes mellitus presented with generalized weakness, lightheadedness and shortness of breath. Patient found to have progressively elevated troponin and admitted to telemetry with IV heparin. Seen by cardiologist and underwent Lexiscan Myoview.   Subjective:   Still feels weak but overall improved. Dyspnea better.   Assessment  & Plan :    Principal Problem:   NSTEMI (non-ST elevated myocardial infarction) ([place]) Placed on IV heparin. Troponin peaked to 0.48. When necessary sublingual nitrate. BNP normal. Continue aspirin, beta blocker. Added Imdur. Patient allergic  to ACEi/ ARB and statin. Lexiscan Myoview results pending. Further recommendations per cardiology.  Active Problems:   Diabetes mellitus without complication ([place]) on sliding scale coverage. A1c of 6.8.  Essential hypertension Continue home medications  Generalized weakness.  PT evaluation pending  Dyslipidemia. Intolerant to statin. LDL of 214.    Code Status : Full code  Family Communication  : Wife at bedside  Disposition Plan  : Pending stress test results and PT evaluation  Barriers For Discharge : Pending  workup  Consults  : Cardiology  Procedures  :  2-D echo.  Lexiscan Myoview  DVT Prophylaxis  :  IV heparin  Lab Results  Component Value Date   PLT 256 10/08/2015    Antibiotics  :    Anti-infectives    None        Objective:   Vitals:   10/08/15 0603 10/08/15 1124 10/08/15 1126 10/08/15 1129  BP: 116/67 (!) 169/72 (!) 139/54 (!) 149/67  Pulse: (!) 54 88 83 74  Resp: 18 20  20 20  Temp: 98.4 F (36.9 C)     TempSrc: Oral     SpO2: 96%     Weight:      Height:        Wt Readings from Last 3 Encounters:  10/07/15 67.4 kg (148 lb 9.6 oz)  07/20/15 70.9 kg (156 lb 6.4 oz)  01/09/15 69.3 kg (152 lb 12.8 oz)     Intake/Output Summary (Last 24 hours) at 10/08/15 1326 Last data filed at 10/08/15 0600  Gross per 24 hour  Intake          2494.86 ml  Output              550 ml  Net          1944.86 ml     Physical Exam  Gen: not in distress HEENT: moist mucosa, supple neck Chest: clear b/l, no added sounds CVS: N S1&S2, no murmurs, rubs or gallop GI: soft, NT, ND, BS+ Musculoskeletal: warm, no edema     Data Review:    CBC  Recent Labs Lab 10/06/15 2100 10/07/15 0903 10/08/15 0547  WBC 7.5 8.8 7.5  HGB 14.1 14.1 12.9*  HCT 41.7 41.9 38.8*  PLT 280 262 256  MCV 90.8 90.7 92.4  MCH 30.7 30.5 30.7  MCHC 33.8 33.7 33.2  RDW 13.0 13.1 13.3  LYMPHSABS 1.6  --   --   MONOABS 0.8  --   --   EOSABS 0.2  --   --   BASOSABS 0.0  --   --     Chemistries   Recent Labs Lab 10/06/15 2100  NA 132*  K 4.4  CL 102  CO2 24  GLUCOSE 198*  BUN 18  CREATININE 0.96  CALCIUM 9.9  AST 18  ALT 12*  ALKPHOS 91  BILITOT 0.9   ------------------------------------------------------------------------------------------------------------------  Recent Labs  10/07/15 0228  CHOL 283*  HDL 38*  LDLCALC 214*  TRIG 154*  CHOLHDL 7.4    Lab Results  Component Value Date   HGBA1C 7.4 (H) 10/07/2015    ------------------------------------------------------------------------------------------------------------------ No results for input(s): TSH, T4TOTAL, T3FREE, THYROIDAB in the last 72 hours.  Invalid input(s): FREET3 ------------------------------------------------------------------------------------------------------------------ No results for input(s): VITAMINB12, FOLATE, FERRITIN, TIBC, IRON, RETICCTPCT in the last 72 hours.  Coagulation profile  Recent Labs Lab 10/07/15 0057  INR 0.97    No results for input(s): DDIMER in the last 72 hours.  Cardiac Enzymes  Recent Labs Lab 10/07/15 0903 10/07/15 1429 10/07/15 1812  TROPONINI 0.31* 0.37* 0.38*   ------------------------------------------------------------------------------------------------------------------    Component Value Date/Time   BNP 88.5 10/07/2015 0228    Inpatient Medications  Scheduled Meds: . amLODipine  5 mg Oral Daily  . aspirin  324 mg Oral Daily  . carvedilol  3.125 mg Oral Q supper  . carvedilol  6.25 mg Oral Q breakfast  . insulin aspart  0-5 Units Subcutaneous QHS  . insulin aspart  0-9 Units Subcutaneous TID WC  . isosorbide mononitrate  30 mg Oral Daily  . latanoprost  1 drop Both Eyes QHS   Continuous Infusions: . sodium chloride 75 mL/hr at 10/08/15 1311  . heparin 950 Units/hr (10/08/15 1311)   PRN Meds:.acetaminophen, hydrALAZINE, morphine injection, nitroGLYCERIN, ondansetron (ZOFRAN) IV  Micro Results Recent Results (from the past 240 hour(s))  Urine culture     Status: Abnormal   Collection Time: 10/06/15  8:46 PM  Result Value Ref Range Status   Specimen Description URINE, RANDOM  Final  Special Requests NONE  Final   Culture (A)  Final    <10,000 COLONIES/mL INSIGNIFICANT GROWTH Performed at [facility]    Report Status 10/08/2015 FINAL  Final    Radiology Reports Dg Chest 2 View  Result Date: 10/06/2015 CLINICAL DATA:  Hypertension today. EXAM:  CHEST  2 VIEW COMPARISON:  02/03/2013. FINDINGS: Stable normal sized heart and post CABG changes. Clear lungs with normal vascularity. Thoracic spine degenerative changes. IMPRESSION: No acute abnormality. Electronically Signed   By: Claudie Revering M.D.   On: 10/06/2015 21:23    Time Spent in minutes  25   Louellen Molder M.D on 10/08/2015 at 1:26 PM  Between 7am to 7pm - Pager - [phone]  After 7pm go to [email or web address] - password [facility]  [facility] -  Office  [phone]

## 2015-10-08 NOTE — Progress Notes (Signed)
PHARMACIST - PHYSICIAN COMMUNICATION CONCERNING:  IV heparin  83 yoM on IV heparin  - please see note written by Peggyann Juba, PharmD on 8/14 for more details.  Pt with "intermediate" myoview scan, plan for diagnostic cath, scheduled for 9AM on 8/15.    Heparin level tonight = 0.17 (goal 0.3-0.7) on IV heparin infusion 950 units/hr.  No bleeding or infusion noted by RN.    RECOMMENDATION: Increase to 1150 units/hr = 11.5 ml/hr Recheck HL in 8 hours.    Ralene Bathe, PharmD, BCPS 10/08/2015, 9:23 PM  Pager: [phone]

## 2015-10-08 NOTE — Progress Notes (Signed)
Subjective:  No chest pain  Objective:  Vital Signs in the last 24 hours: Temp:  [97.9 F (36.6 C)-98.5 F (36.9 C)] 98.4 F (36.9 C) (08/14 0603) Pulse Rate:  [54-59] 54 (08/14 0603) Resp:  [18] 18 (08/14 0603) BP: (104-162)/(60-70) 116/67 (08/14 0603) SpO2:  [95 %-96 %] 96 % (08/14 0603)  Intake/Output from previous day:  Intake/Output Summary (Last 24 hours) at 10/08/15 0927 Last data filed at 10/08/15 0600  Gross per 24 hour  Intake          2494.86 ml  Output              550 ml  Net          1944.86 ml    Physical Exam: General appearance: alert, cooperative and no distress Neck: no JVD Lungs: clear to auscultation bilaterally Heart: regular rate and rhythm Extremities: no edema Skin: Skin color, texture, turgor normal. No rashes or lesions Neurologic: Grossly normal   Rate: 50  Rhythm: sinus bradycardia  Lab Results:  Recent Labs  10/07/15 0903 10/08/15 0547  WBC 8.8 7.5  HGB 14.1 12.9*  PLT 262 256    Recent Labs  10/06/15 2100  NA 132*  K 4.4  CL 102  CO2 24  GLUCOSE 198*  BUN 18  CREATININE 0.96    Recent Labs  10/07/15 1429 10/07/15 1812  TROPONINI 0.37* 0.38*    Recent Labs  10/07/15 0057  INR 0.97    Scheduled Meds: . amLODipine  5 mg Oral Daily  . aspirin  324 mg Oral Daily  . carvedilol  3.125 mg Oral Q supper  . carvedilol  6.25 mg Oral Q breakfast  . insulin aspart  0-5 Units Subcutaneous QHS  . insulin aspart  0-9 Units Subcutaneous TID WC  . isosorbide mononitrate  30 mg Oral Daily  . latanoprost  1 drop Both Eyes QHS  . regadenoson  0.4 mg Intravenous Once   Continuous Infusions: . sodium chloride 75 mL/hr at 10/08/15 0416  . heparin 950 Units/hr (10/07/15 1720)   PRN Meds:.acetaminophen, hydrALAZINE, morphine injection, nitroGLYCERIN, ondansetron (ZOFRAN) IV   Imaging: Imaging results have been reviewed  Cardiac Studies: echo 10/07/15 Study Conclusions  - Left ventricle: The cavity size was  normal. There was mild   concentric hypertrophy. Systolic function was normal. The   estimated ejection fraction was in the range of 60% to 65%. Mild   hypokinesis of the anteroseptal myocardium. Doppler parameters   are consistent with abnormal left ventricular relaxation (grade 1   diastolic dysfunction). Doppler parameters are consistent with   indeterminate ventricular filling pressure. - Aortic valve: Transvalvular velocity was within the normal range.   There was no stenosis. There was no regurgitation. - Mitral valve: Transvalvular velocity was within the normal range.   There was no evidence for stenosis. There was mild regurgitation. - Right ventricle: The cavity size was normal. Wall thickness was   normal. Systolic function was normal. - Tricuspid valve: There was mild regurgitation. - Pulmonary arteries: Systolic pressure was within the normal   range. PA peak pressure: 24 mm Hg (S).   Assessment/Plan:  82 year old gentleman with a history of CAD, s/p PTCA/stenting of his mid RCA in December 2001, and CABG x3 surgery in 12/27/2005 with a LIMA graft to the LAD, a vein graft to the obtuse marginal, and a vein graft to the PDA.  He has a history of hypertension, type 2 diabetes mellitus, and hyperlipidemia (LDL 200)  with statin and Zetia intolerrance. He could not afford PCSK9. He has had exertional fatigue and dyspnea. He does not have chest pain as an anginal symptom. He was admitted and Troponin level was elevated.   Active Problems:   Coronary atherosclerosis   Elevated troponin   Exertional dyspnea-? anginal equivalent   Diabetes mellitus without complication ([place])   Hyperlipidemia   Essential hypertension   PLAN: Lexsican Myoview today.   Kerin Ransom PA-C 10/08/2015, 9:27 AM [phone]  Patient seen and examined. Agree with assessment and plan. No chest pain. Lexiscan myoview to assess for silent ischemia. He tolerated pharmocologic stress without cp or dyspnea. To  have f/u imaging and await for perfusion data.   Troy Sine, MD, [facility] 10/08/2015 11:44 AM

## 2015-10-08 NOTE — Progress Notes (Signed)
PT Cancellation Note  Patient Details Name: Johnny [name]. MRN: [id number] DOB: [birth year]   Cancelled Treatment:    Reason Eval/Treat Not Completed: Patient not medically ready --Pt has been transferred to [facility] earlier today for cardiology test(s). Will attempt eval at a later time as schedule allows.   [facility] 10/08/2015, 12:10 PM

## 2015-10-09 ENCOUNTER — Encounter (HOSPITAL_COMMUNITY)
Admission: EM | Disposition: A | Discharge status: Home / Self Care | Payer: Self-pay | Source: Home / Self Care | Attending: Internal Medicine

## 2015-10-09 ENCOUNTER — Other Ambulatory Visit: Payer: Self-pay

## 2015-10-09 DIAGNOSIS — R931 Abnormal findings on diagnostic imaging of heart and coronary circulation: Secondary | ICD-10-CM

## 2015-10-09 DIAGNOSIS — I2510 Atherosclerotic heart disease of native coronary artery without angina pectoris: Secondary | ICD-10-CM

## 2015-10-09 DIAGNOSIS — R9439 Abnormal result of other cardiovascular function study: Secondary | ICD-10-CM | POA: Diagnosis present

## 2015-10-09 HISTORY — PX: CARDIAC CATHETERIZATION: SHX172

## 2015-10-09 LAB — CBC
HCT: 41.9 % (ref 39.0–52.0)
HEMOGLOBIN: 13.8 g/dL (ref 13.0–17.0)
MCH: 30.7 pg (ref 26.0–34.0)
MCHC: 32.9 g/dL (ref 30.0–36.0)
MCV: 93.1 fL (ref 78.0–100.0)
PLATELETS: 262 10*3/uL (ref 150–400)
RBC: 4.5 MIL/uL (ref 4.22–5.81)
RDW: 13.4 % (ref 11.5–15.5)
WBC: 7.7 10*3/uL (ref 4.0–10.5)

## 2015-10-09 LAB — BASIC METABOLIC PANEL
Anion gap: 4 — ABNORMAL LOW (ref 5–15)
BUN: 18 mg/dL (ref 6–20)
CO2: 25 mmol/L (ref 22–32)
Calcium: 9.8 mg/dL (ref 8.9–10.3)
Chloride: 109 mmol/L (ref 101–111)
Creatinine, Ser: 0.98 mg/dL (ref 0.61–1.24)
GFR calc Af Amer: >60 mL/min (ref >60)
GFR calc non Af Amer: >60 mL/min (ref >60)
Glucose, Bld: 105 mg/dL — ABNORMAL HIGH (ref 65–99)
Potassium: 4.1 mmol/L (ref 3.5–5.1)
Sodium: 138 mmol/L (ref 135–145)

## 2015-10-09 LAB — HEPARIN LEVEL (UNFRACTIONATED): HEPARIN UNFRACTIONATED: 0.61 [IU]/mL (ref 0.30–0.70)

## 2015-10-09 LAB — GLUCOSE, CAPILLARY
GLUCOSE-CAPILLARY: 107 mg/dL — AB (ref 65–99)
GLUCOSE-CAPILLARY: 115 mg/dL — AB (ref 65–99)
Glucose-Capillary: 192 mg/dL — ABNORMAL HIGH (ref 65–99)

## 2015-10-09 SURGERY — LEFT HEART CATH AND CORS/GRAFTS ANGIOGRAPHY
Anesthesia: LOCAL

## 2015-10-09 SURGERY — LEFT HEART CATH AND CORS/GRAFTS ANGIOGRAPHY
Anesthesia: Choice

## 2015-10-09 MED ORDER — FENTANYL CITRATE (PF) 100 MCG/2ML IJ SOLN
INTRAMUSCULAR | Status: AC
  Filled 2015-10-09: qty 2

## 2015-10-09 MED ORDER — VALSARTAN 40 MG PO TABS
40.0000 mg | ORAL_TABLET | Freq: Every day | ORAL | 0 refills | Status: DC
Start: 2015-10-09 — End: 2017-08-04

## 2015-10-09 MED ORDER — MIDAZOLAM HCL 2 MG/2ML IJ SOLN
INTRAMUSCULAR | Status: DC | PRN
  Administered 2015-10-09: 1 mg via INTRAVENOUS

## 2015-10-09 MED ORDER — HEPARIN SODIUM (PORCINE) 1000 UNIT/ML IJ SOLN
INTRAMUSCULAR | Status: AC
  Filled 2015-10-09: qty 1

## 2015-10-09 MED ORDER — HEPARIN (PORCINE) IN NACL 2-0.9 UNIT/ML-% IJ SOLN
INTRAMUSCULAR | Status: DC | PRN
  Administered 2015-10-09: 1500 mL

## 2015-10-09 MED ORDER — SODIUM CHLORIDE 0.9 % IV SOLN: 250.0000 mL | INTRAVENOUS | Status: DC | PRN

## 2015-10-09 MED ORDER — MIDAZOLAM HCL 2 MG/2ML IJ SOLN
INTRAMUSCULAR | Status: AC
  Filled 2015-10-09: qty 2

## 2015-10-09 MED ORDER — ACETAMINOPHEN 325 MG PO TABS: 650.0000 mg | ORAL_TABLET | ORAL | Status: DC | PRN

## 2015-10-09 MED ORDER — SODIUM CHLORIDE 0.9% FLUSH: 3.0000 mL | INTRAVENOUS | Status: DC | PRN

## 2015-10-09 MED ORDER — AMLODIPINE BESYLATE 5 MG PO TABS: 5.0000 mg | ORAL_TABLET | Freq: Every day | ORAL | 0 refills | Status: DC

## 2015-10-09 MED ORDER — VERAPAMIL HCL 2.5 MG/ML IV SOLN
INTRAVENOUS | Status: AC
  Filled 2015-10-09: qty 2

## 2015-10-09 MED ORDER — VERAPAMIL HCL 2.5 MG/ML IV SOLN
INTRAVENOUS | Status: DC | PRN
  Administered 2015-10-09: 10 mL via INTRA_ARTERIAL

## 2015-10-09 MED ORDER — SODIUM CHLORIDE 0.9% FLUSH: 3.0000 mL | Freq: Two times a day (BID) | INTRAVENOUS | Status: DC

## 2015-10-09 MED ORDER — SODIUM CHLORIDE 0.9 % WEIGHT BASED INFUSION: 1.0000 mL/kg/h | INTRAVENOUS | Status: AC

## 2015-10-09 MED ORDER — HEPARIN SODIUM (PORCINE) 1000 UNIT/ML IJ SOLN
INTRAMUSCULAR | Status: DC | PRN
  Administered 2015-10-09: 3500 [IU] via INTRAVENOUS

## 2015-10-09 MED ORDER — FENTANYL CITRATE (PF) 100 MCG/2ML IJ SOLN
INTRAMUSCULAR | Status: DC | PRN
  Administered 2015-10-09: 25 ug via INTRAVENOUS

## 2015-10-09 MED ORDER — IOPAMIDOL (ISOVUE-370) INJECTION 76%
INTRAVENOUS | Status: DC | PRN
  Administered 2015-10-09: 95 mL via INTRA_ARTERIAL

## 2015-10-09 MED ORDER — IOPAMIDOL (ISOVUE-370) INJECTION 76%
INTRAVENOUS | Status: AC
  Filled 2015-10-09: qty 125

## 2015-10-09 MED ORDER — LIDOCAINE HCL (PF) 1 % IJ SOLN
INTRAMUSCULAR | Status: AC
  Filled 2015-10-09: qty 30

## 2015-10-09 MED ORDER — ISOSORBIDE MONONITRATE ER 30 MG PO TB24
30.0000 mg | ORAL_TABLET | Freq: Every day | ORAL | 0 refills | Status: DC
Start: 2015-10-09 — End: 2015-10-25

## 2015-10-09 MED ORDER — HEPARIN (PORCINE) IN NACL 2-0.9 UNIT/ML-% IJ SOLN
INTRAMUSCULAR | Status: AC
  Filled 2015-10-09: qty 1500

## 2015-10-09 MED ORDER — IRBESARTAN 75 MG PO TABS
37.5000 mg | ORAL_TABLET | Freq: Every day | ORAL | Status: DC
Start: 2015-10-09 — End: 2015-10-09
  Filled 2015-10-09: qty 0.5

## 2015-10-09 MED ORDER — ONDANSETRON HCL 4 MG/2ML IJ SOLN: 4.0000 mg | Freq: Four times a day (QID) | INTRAMUSCULAR | Status: DC | PRN

## 2015-10-09 MED ORDER — LIDOCAINE HCL (PF) 1 % IJ SOLN
INTRAMUSCULAR | Status: DC | PRN
  Administered 2015-10-09: 3 mL

## 2015-10-09 SURGICAL SUPPLY — 12 items
CATH INFINITI 5 FR IM (CATHETERS) ×1 IMPLANT
CATH INFINITI 5 FR JL3.5 (CATHETERS) ×1 IMPLANT
CATH INFINITI 5FR ANG PIGTAIL (CATHETERS) IMPLANT
CATH INFINITI JR4 5F (CATHETERS) ×1 IMPLANT
DEVICE RAD COMP TR BAND LRG (VASCULAR PRODUCTS) ×1 IMPLANT
GLIDESHEATH SLEND SS 6F .021 (SHEATH) ×1 IMPLANT
KIT HEART LEFT (KITS) ×2 IMPLANT
PACK CARDIAC CATHETERIZATION (CUSTOM PROCEDURE TRAY) ×2 IMPLANT
TRANSDUCER W/STOPCOCK (MISCELLANEOUS) ×2 IMPLANT
TUBING CIL FLEX 10 FLL-RA (TUBING) ×2 IMPLANT
WIRE HI TORQ VERSACORE-J 145CM (WIRE) ×1 IMPLANT
WIRE SAFE-T 1.5MM-J .035X260CM (WIRE) ×1 IMPLANT

## 2015-10-09 NOTE — Progress Notes (Signed)
Pt could not remember [facility] agency he used before. [facility] was selected.

## 2015-10-09 NOTE — Progress Notes (Signed)
PT Cancellation Note  Patient Details Name: Johnny [name]. MRN: [id number] DOB: 03-14-1932   Cancelled Treatment:    Reason Eval/Treat Not Completed: Patient at procedure or test/unavailable (cardiac cath at [facility])   [facility] 10/09/2015, 9:16 AM

## 2015-10-09 NOTE — Progress Notes (Signed)
Subjective:  No chest pain  Objective:  Vital Signs in the last 24 hours: Temp:  [97.5 F (36.4 C)-97.7 F (36.5 C)] 97.7 F (36.5 C) (08/15 0524) Pulse Rate:  [48-88] 59 (08/15 0524) Resp:  [18-20] 20 (08/15 0524) BP: (121-169)/(48-80) 135/80 (08/15 0524) SpO2:  [97 %] 97 % (08/15 0524)  Intake/Output from previous day:  Intake/Output Summary (Last 24 hours) at 10/09/15 0906 Last data filed at 10/09/15 0700  Gross per 24 hour  Intake          2427.79 ml  Output                0 ml  Net          2427.79 ml   I/O since admission: 4416   Physical Exam: General appearance: alert, cooperative and no distress Neck: no JVD Lungs: clear to auscultation bilaterally Heart: regular rate and rhythm Extremities: no edema Skin: Skin color, texture, turgor normal. No rashes or lesions Neurologic: Grossly normal   Rate: 50  Rhythm: sinus bradycardia  Lab Results:  Recent Labs  10/08/15 0547 10/09/15 0525  WBC 7.5 7.7  HGB 12.9* 13.8  PLT 256 262    Recent Labs  10/06/15 2100 10/09/15 0525  NA 132* 138  K 4.4 4.1  CL 102 109  CO2 24 25  GLUCOSE 198* 105*  BUN 18 18  CREATININE 0.96 0.98    Recent Labs  10/07/15 1429 10/07/15 1812  TROPONINI 0.37* 0.38*    Recent Labs  10/07/15 0057  INR 0.97    Scheduled Meds: . amLODipine  5 mg Oral Daily  . aspirin  324 mg Oral Daily  . carvedilol  3.125 mg Oral Q supper  . carvedilol  6.25 mg Oral Q breakfast  . insulin aspart  0-5 Units Subcutaneous QHS  . insulin aspart  0-9 Units Subcutaneous TID WC  . isosorbide mononitrate  30 mg Oral Daily  . latanoprost  1 drop Both Eyes QHS  . sodium chloride flush  3 mL Intravenous Q12H   Continuous Infusions: . sodium chloride Stopped (10/09/15 0622)  . sodium chloride 1 mL/kg/hr (10/09/15 0720)  . heparin Stopped (10/09/15 0839)   PRN Meds:.sodium chloride, acetaminophen, hydrALAZINE, morphine injection, nitroGLYCERIN, ondansetron (ZOFRAN) IV, sodium  chloride flush   Imaging:   10/08/15   Nuclear Result    There was no ST segment deviation noted during stress.  Defect 1: There is a small defect of moderate severity present in the basal anteroseptal, mid anteroseptal and apical septal location.  Findings consistent with ischemia.  This is an intermediate risk study.  The left ventricular ejection fraction is normal ( 59%)       Cardiac Studies: echo 10/07/15 Study Conclusions  - Left ventricle: The cavity size was normal. There was mild   concentric hypertrophy. Systolic function was normal. The   estimated ejection fraction was in the range of 60% to 65%. Mild   hypokinesis of the anteroseptal myocardium. Doppler parameters   are consistent with abnormal left ventricular relaxation (grade 1   diastolic dysfunction). Doppler parameters are consistent with   indeterminate ventricular filling pressure. - Aortic valve: Transvalvular velocity was within the normal range.   There was no stenosis. There was no regurgitation. - Mitral valve: Transvalvular velocity was within the normal range.   There was no evidence for stenosis. There was mild regurgitation. - Right ventricle: The cavity size was normal. Wall thickness was   normal. Systolic function  was normal. - Tricuspid valve: There was mild regurgitation. - Pulmonary arteries: Systolic pressure was within the normal   range. PA peak pressure: 24 mm Hg (S).   Assessment/Plan:  82 year old gentleman with a history of CAD, s/p PTCA/stenting of his mid RCA in December 2001, and CABG x3 surgery in 12/27/2005 with a LIMA graft to the LAD, a vein graft to the obtuse marginal, and a vein graft to the PDA.  He has a history of hypertension, type 2 diabetes mellitus, and hyperlipidemia (LDL 200) with statin and Zetia intolerrance. He could not afford PCSK9. He has had exertional fatigue and dyspnea. He does not have chest pain as an anginal symptom. He was admitted and Troponin  level was elevated.   Active Problems:   Diabetes mellitus without complication (H[facility])   Hyperlipidemia   Coronary atherosclerosis   Elevated troponin   Essential hypertension   Exertional dyspnea-? anginal equivalent  1.  CAD: The patient is status post PCI to his mid RCA in 2001 and CABG surgery 3 in 2007.  He's not had a cardiac catheterization since his bypass surgery.  His last nuclear perfusion study in March 2015 at the [facility] revealed normal perfusion and LV function.  The patient presented hypertensive and amlodipine was added to his medical regimen; Imdur was added.  Lexiscan myoview scan done yesterday suggests anterior ischemia as noted above.  Definitive cath scheduled for today. The risks and benefits of a cardiac catheterization including, but not limited to, death, stroke, MI, kidney damage and bleeding were discussed with the patient who indicates understanding and agrees to proceed.  Discussed with patient and wife.   2.  Essential hypertension: Patient presented with systolic blood pressure greater than 160 despite being on carvedilol and very low-dose valsartan.  Amlodipine was added to his medical regimen.  Will also add nitrates.  If renal function remained stable consider titration of very low-dose valsartan.  3.  Hyperlipidemia: Apparently the patient is intolerant to statins and Zetia.  He never pursued PCSK9 inhibition, due to co-pay cost. Chol 283 and LDL 214.  Will need to re-address PCSK9 Rx.   4. Type 2 diabetes mellitus on metformin.  Recommend hemoglobin A1c evaluation   Troy Sine, MD, [facility] 10/09/2015 9:06 AM

## 2015-10-09 NOTE — Progress Notes (Signed)
TR BAND REMOVAL  LOCATION:    Radial  Left radial  DEFLATED PER PROTOCOL:   yes  TIME BAND OFF / DRESSING APPLIED:    1400/ small tegaderm applied  SITE UPON ARRIVAL:    Level 0  SITE AFTER BAND REMOVAL:    Level  0  CIRCULATION SENSATION AND MOVEMENT:    Within Normal Limits :  yes  COMMENTS:

## 2015-10-09 NOTE — Research (Signed)
LEADERS FREE Informed Consent   Subject Name: Johnny [name].  Subject met inclusion and exclusion criteria.  The informed consent form, study requirements and expectations were reviewed with the subject and questions and concerns were addressed prior to the signing of the consent form.  The subject verbalized understanding of the trail requirements.  The subject agreed to participate in the Leaders Free trial and signed the informed consent.  The informed consent was obtained prior to performance of any protocol-specific procedures for the subject.  A copy of the signed informed consent was given to the subject and a copy was placed in the subject's medical record.  Sandie Ano 10/09/2015, 10:00

## 2015-10-09 NOTE — Progress Notes (Signed)
ANTICOAGULATION CONSULT NOTE - Follow Up Consult  Pharmacy Consult for heparin Indication: chest pain/ACS  Allergies  Allergen Reactions  . Cozaar   . Ace Inhibitors Itching    Ache, itching..  . Crestor [Rosuvastatin Calcium]     Muscle aches  . Lipitor [Atorvastatin Calcium]     Muscle aches  . Lisinopril     REACTION: rash ?  . Lovastatin     Muscle aches  . Pravastatin     Muscle aches  . Zetia [Ezetimibe] Diarrhea  . Zocor [Simvastatin - High Dose]     Muscle aches  . Latex Rash    Patient Measurements: Height: 5\' 7"  (170.2 cm) Weight: 148 lb 9.6 oz (67.4 kg) IBW/kg (Calculated) : 66.1 Heparin Dosing Weight: 67 kg  Vital Signs: Temp: 97.7 F (36.5 C) (08/15 0524) Temp Source: Oral (08/15 0524) BP: 135/80 (08/15 0524) Pulse Rate: 59 (08/15 0524)  Labs:  Recent Labs  10/06/15 2100 10/07/15 0057 10/07/15 0058  10/07/15 0903 10/07/15 1429 10/07/15 1812 10/08/15 0547 10/08/15 2024 10/09/15 0525  HGB 14.1  --   --   --  14.1  --   --  12.9*  --  13.8  HCT 41.7  --   --   --  41.9  --   --  38.8*  --  41.9  PLT 280  --   --   --  262  --   --  256  --  262  APTT  --   --  37*  --   --   --   --   --   --   --   LABPROT  --  12.9  --   --   --   --   --   --   --   --   INR  --  0.97  --   --   --   --   --   --   --   --   HEPARINUNFRC  --   --   --   < > 0.27*  --  0.38 0.36 0.17* 0.61  CREATININE 0.96  --   --   --   --   --   --   --   --  0.98  TROPONINI 0.04*  --   --   < > 0.31* 0.37* 0.38*  --   --   --   < > = values in this interval not displayed.  Estimated Creatinine Clearance: 55.3 mL/min (by C-G formula based on SCr of 0.98 mg/dL).   Assessment: Patient is an 82 y.o. M with hx CAD, s/p CABG in 2007 presented to the ED on 8/12 with c/o SOB.  He was found to have elevated troponin.  Heparin drip started on admission for r/o ACS.  Today, 10/09/2015:  Heparin level therapeutic (0.61) on 1150 units/hr  H/H improved overnight; Plts  stable  No bleeding documented  Concurrent aspirin 325mg  daily noted  Pt with "intermediate" myoview scan   Goal of Therapy:  Heparin level 0.3-0.7 units/ml Monitor platelets by anticoagulation protocol: Yes   Plan:   Continue heparin drip at 1150 units/hr  Orders to d/c heparin on-call to cath lab  Diagnostic cath scheduled for 9AM today - will f/u afterward for anticoagulation plan    Peggyann Juba, PharmD, BCPS Pager: [phone] 10/09/2015,8:09 AM

## 2015-10-09 NOTE — Discharge Summary (Signed)
Physician Discharge Summary  [name]. [id number] DOB: 1933/01/28 DOA: 10/06/2015  PCP: [name], MD  Admit date: 10/06/2015 Discharge date: 10/09/2015  Admitted From: HOME Disposition:  HOME   Recommendations for Outpatient Follow-up:  1. Follow up with PCP in 1-2 weeks   Home Health:PT/OT Equipment/Devices: Has both gain and walker. Encouraged to use walker until seen by home PT  Discharge Condition: Stable CODE STATUS: Full code Diet recommendation: Heart Healthy / Carb Modified   Discharge Diagnoses:  Principal problem   NSTEMI (non-ST elevated myocardial infarction) ([place])  Active Problems:   Diabetes mellitus without complication ([place])   Hyperlipidemia   Coronary atherosclerosis   Essential hypertension   Exertional dyspnea-? anginal equivalent   Abnormal nuclear stress test  Brief narrative/history of present illness 82 year old male with history of CAD (cardiac stent in 2001/2007), CABG in 2007, hypertension, hyperlipidemia, diabetes mellitus presented with generalized weakness, lightheadedness and shortness of breath. Patient found to have progressively elevated troponin and admitted to telemetry with IV heparin. Seen by cardiologist and underwent Lexiscan Myoview showing intermediate probability for ischemia. Underwent left heart catheterization on 8/15.  Hospital course Principal Problem:   NSTEMI (non-ST elevated myocardial infarction) ([place]) Placed on IV heparin. Troponin peaked to 0.48.  BNP normal. Continue aspirin, beta blocker. Added Imdur and increased dose of valsartan.. Patient allergic to statin. Lexiscan Myoview done showing intermediate probability for anterior ischemia. Left heart catheterization done today showing the following:  Ost RCA to Prox RCA lesion, 95 %stenosed. SVG to PDA is patent.  Mid Cx lesion, 100 %stenosed. SVG to OM is patent.  Ost LAD to Prox LAD lesion, 50 %stenosed. LIMA to LAD is patent.  LV end  diastolic pressure is normal.  No intervention done and recommended aggressive medical management. Patient stable and will be discharged home with outpatient follow-up.  Active Problems:   Diabetes mellitus without complication (H[facility])  123456 of 6.8. Resume metformin from tomorrow.  Essential hypertension Continue beta blocker. Increase dose of valsartan. Also added Imdur and low-dose amlodipine.  Generalized weakness.  Patient wishes to go home before being seen by physical therapy. We'll arrange home health PT  Dyslipidemia. Intolerant to statin. LDL of 214, intolerant to Zetia and statin. Home meds show pt to be on PCSK9.   Family Communication  : Wife at bedside  Disposition Plan  :  home with home health  Consults  : Cardiology  Procedures  :  2-D echo.  Lexiscan Myoview Left heart catheterization  Discharge Instructions     Medication List    TAKE these medications   aspirin 81 MG tablet Take 81 mg by mouth every evening.   carvedilol 6.25 MG tablet Commonly known as:  COREG Take 3.125-6.25 mg by mouth 2 (two) times daily with a meal. Take 6.25 in the morning and 3.125mg  at night   Evolocumab 140 MG/ML Soaj Commonly known as:  REPATHA SURECLICK Inject 1 pen into the skin every 14 (fourteen) days.   Flaxseed (Linseed) Powd Take 1 tablet by mouth daily.   isosorbide mononitrate 30 MG 24 hr tablet Commonly known as:  IMDUR Take 1 tablet (30 mg total) by mouth daily.   metFORMIN 500 MG 24 hr tablet Commonly known as:  GLUCOPHAGE-XR Take 500 mg by mouth daily with breakfast.   nitroGLYCERIN 0.4 MG SL tablet Commonly known as:  NITROSTAT Place 1 tablet (0.4 mg total) under the tongue every 5 (five) minutes as needed for chest pain.   Travoprost (BAK Free)  0.004 % Soln ophthalmic solution Commonly known as:  TRAVATAN Place 1 drop into both eyes at bedtime.   valsartan 40 MG tablet Commonly known as:  DIOVAN Take 20 mg by mouth daily.       Follow-up Information    HOLWERDA, Nicki Reaper, MD. Schedule an appointment as soon as possible for a visit in 1 week(s).   Specialty:  Internal Medicine Contact information: [place] [address] [phone]          Allergies  Allergen Reactions  . Cozaar   . Ace Inhibitors Itching    Ache, itching..  . Crestor [Rosuvastatin Calcium]     Muscle aches  . Lipitor [Atorvastatin Calcium]     Muscle aches  . Lisinopril     REACTION: rash ?  . Lovastatin     Muscle aches  . Pravastatin     Muscle aches  . Zetia [Ezetimibe] Diarrhea  . Zocor [Simvastatin - High Dose]     Muscle aches  . Latex Rash        Procedures/Studies: Dg Chest 2 View  Result Date: 10/06/2015 CLINICAL DATA:  Hypertension today. EXAM: CHEST  2 VIEW COMPARISON:  02/03/2013. FINDINGS: Stable normal sized heart and post CABG changes. Clear lungs with normal vascularity. Thoracic spine degenerative changes. IMPRESSION: No acute abnormality. Electronically Signed   By: Claudie Revering M.D.   On: 10/06/2015 21:23   Nm Myocar Multi W/spect W/wall Motion / Ef  Result Date: 10/08/2015  There was no ST segment deviation noted during stress.  Defect 1: There is a small defect of moderate severity present in the basal anteroseptal, mid anteroseptal and apical septal location.  Findings consistent with ischemia.  This is an intermediate risk study.  The left ventricular ejection fraction is normal ( 59%)     2-D echo Lexiscan Myoview Left heart catheterization   Subjective: Seen after returning from cardiac cath. Denies any symptoms.  Discharge Exam: Vitals:   10/09/15 1400 10/09/15 1516  BP: (!) 148/59 (!) 112/48  Pulse: (!) 52 (!) 54  Resp: 13 16  Temp:  97.5 F (36.4 C)   Vitals:   10/09/15 1330 10/09/15 1345 10/09/15 1400 10/09/15 1516  BP: (!) 130/51 (!) 137/54 (!) 148/59 (!) 112/48  Pulse: (!) 54 61 (!) 52 (!) 54  Resp: 15 16 13 16   Temp:    97.5 F (36.4 C)  TempSrc:     Oral  SpO2: 92% 94% 97% 97%  Weight:      Height:          Gen: not in distress HEENT: moist mucosa, supple neck Chest: clear b/l, no added sounds CVS: N S1&S2, no murmurs, rubs or gallop GI: soft, NT, ND, BS+ Musculoskeletal: warm, no edema, left radial cath site appears clean    The results of significant diagnostics from this hospitalization (including imaging, microbiology, ancillary and laboratory) are listed below for reference.     Microbiology: Recent Results (from the past 240 hour(s))  Urine culture     Status: Abnormal   Collection Time: 10/06/15  8:46 PM  Result Value Ref Range Status   Specimen Description URINE, RANDOM  Final   Special Requests NONE  Final   Culture (A)  Final    <10,000 COLONIES/mL INSIGNIFICANT GROWTH Performed at [facility]    Report Status 10/08/2015 FINAL  Final     Labs: BNP (last 3 results)  Recent Labs  10/07/15 0228  BNP 88.5   Basic  Metabolic Panel:  Recent Labs Lab 10/06/15 2100 10/09/15 0525  NA 132* 138  K 4.4 4.1  CL 102 109  CO2 24 25  GLUCOSE 198* 105*  BUN 18 18  CREATININE 0.96 0.98  CALCIUM 9.9 9.8   Liver Function Tests:  Recent Labs Lab 10/06/15 2100  AST 18  ALT 12*  ALKPHOS 91  BILITOT 0.9  PROT 6.4*  ALBUMIN 3.6   No results for input(s): LIPASE, AMYLASE in the last 168 hours. No results for input(s): AMMONIA in the last 168 hours. CBC:  Recent Labs Lab 10/06/15 2100 10/07/15 0903 10/08/15 0547 10/09/15 0525  WBC 7.5 8.8 7.5 7.7  NEUTROABS 4.9  --   --   --   HGB 14.1 14.1 12.9* 13.8  HCT 41.7 41.9 38.8* 41.9  MCV 90.8 90.7 92.4 93.1  PLT 280 262 256 262   Cardiac Enzymes:  Recent Labs Lab 10/06/15 2100 10/07/15 0228 10/07/15 0903 10/07/15 1429 10/07/15 1812  TROPONINI 0.04* 0.48* 0.31* 0.37* 0.38*   BNP: Invalid input(s): POCBNP CBG:  Recent Labs Lab 10/08/15 1344 10/08/15 1717 10/08/15 2124 10/09/15 0734 10/09/15 1223  GLUCAP 158* 203* 174*  107* 115*   D-Dimer No results for input(s): DDIMER in the last 72 hours. Hgb A1c  Recent Labs  10/07/15 0228  HGBA1C 7.4*   Lipid Profile  Recent Labs  10/07/15 0228  CHOL 283*  HDL 38*  LDLCALC 214*  TRIG 154*  CHOLHDL 7.4   Thyroid function studies No results for input(s): TSH, T4TOTAL, T3FREE, THYROIDAB in the last 72 hours.  Invalid input(s): FREET3 Anemia work up No results for input(s): VITAMINB12, FOLATE, FERRITIN, TIBC, IRON, RETICCTPCT in the last 72 hours. Urinalysis    Component Value Date/Time   COLORURINE YELLOW 10/06/2015 2046   APPEARANCEUR CLEAR 10/06/2015 2046   LABSPEC 1.007 10/06/2015 2046   PHURINE 7.5 10/06/2015 2046   GLUCOSEU NEGATIVE 10/06/2015 2046   HGBUR TRACE (A) 10/06/2015 2046   BILIRUBINUR NEGATIVE 10/06/2015 2046   [place] NEGATIVE 10/06/2015 2046   PROTEINUR 100 (A) 10/06/2015 2046   NITRITE NEGATIVE 10/06/2015 2046   LEUKOCYTESUR NEGATIVE 10/06/2015 2046   Sepsis Labs Invalid input(s): PROCALCITONIN,  WBC,  LACTICIDVEN Microbiology Recent Results (from the past 240 hour(s))  Urine culture     Status: Abnormal   Collection Time: 10/06/15  8:46 PM  Result Value Ref Range Status   Specimen Description URINE, RANDOM  Final   Special Requests NONE  Final   Culture (A)  Final    <10,000 COLONIES/mL INSIGNIFICANT GROWTH Performed at [facility]    Report Status 10/08/2015 FINAL  Final     Time coordinating discharge: Over 30 minutes  SIGNED:   Louellen Molder, MD  [facility] 10/09/2015, 3:35 PM Pager   If 7PM-7AM, please contact night-coverage [email or web address] Password [email or web address]

## 2015-10-09 NOTE — Progress Notes (Signed)
OT Cancellation Note  Patient Details Name: Johnny [name]. MRN: [id number] DOB: 11/23/1932   Cancelled Treatment:    Reason Eval/Treat Not Completed: Pt is at cardiac cath lab at [facility]. Will check back tomorrow.  Kirk Basquez 10/09/2015, 10:59 AM  Lesle Chris, OTR/L [phone] 10/09/2015

## 2015-10-09 NOTE — Interval H&P Note (Signed)
Cath Lab Visit (complete for each Cath Lab visit)  Clinical Evaluation Leading to the Procedure:   ACS: Yes.    Non-ACS:    Anginal Classification: CCS IV  Anti-ischemic medical therapy: Minimal Therapy (1 class of medications)  Non-Invasive Test Results: Intermediate-risk stress test findings: cardiac mortality 1-3%/year  Prior CABG: Previous CABG      History and Physical Interval Note:  10/09/2015 11:12 AM  [name].  has presented today for surgery, with the diagnosis of Abnormal Stress Test  The various methods of treatment have been discussed with the patient and family. After consideration of risks, benefits and other options for treatment, the patient has consented to  Procedure(s): Left Heart Cath and Cors/Grafts Angiography (N/A) as a surgical intervention .  The patient's history has been reviewed, patient examined, no change in status, stable for surgery.  I have reviewed the patient's chart and labs.  Questions were answered to the patient's satisfaction.     Larae Grooms

## 2015-10-09 NOTE — H&P (View-Only) (Signed)
Subjective:  No chest pain  Objective:  Vital Signs in the last 24 hours: Temp:  [97.5 F (36.4 C)-97.7 F (36.5 C)] 97.7 F (36.5 C) (08/15 0524) Pulse Rate:  [48-88] 59 (08/15 0524) Resp:  [18-20] 20 (08/15 0524) BP: (121-169)/(48-80) 135/80 (08/15 0524) SpO2:  [97 %] 97 % (08/15 0524)  Intake/Output from previous day:  Intake/Output Summary (Last 24 hours) at 10/09/15 0906 Last data filed at 10/09/15 0700  Gross per 24 hour  Intake          2427.79 ml  Output                0 ml  Net          2427.79 ml   I/O since admission: 4416   Physical Exam: General appearance: alert, cooperative and no distress Neck: no JVD Lungs: clear to auscultation bilaterally Heart: regular rate and rhythm Extremities: no edema Skin: Skin color, texture, turgor normal. No rashes or lesions Neurologic: Grossly normal   Rate: 50  Rhythm: sinus bradycardia  Lab Results:  Recent Labs  10/08/15 0547 10/09/15 0525  WBC 7.5 7.7  HGB 12.9* 13.8  PLT 256 262    Recent Labs  10/06/15 2100 10/09/15 0525  NA 132* 138  K 4.4 4.1  CL 102 109  CO2 24 25  GLUCOSE 198* 105*  BUN 18 18  CREATININE 0.96 0.98    Recent Labs  10/07/15 1429 10/07/15 1812  TROPONINI 0.37* 0.38*    Recent Labs  10/07/15 0057  INR 0.97    Scheduled Meds: . amLODipine  5 mg Oral Daily  . aspirin  324 mg Oral Daily  . carvedilol  3.125 mg Oral Q supper  . carvedilol  6.25 mg Oral Q breakfast  . insulin aspart  0-5 Units Subcutaneous QHS  . insulin aspart  0-9 Units Subcutaneous TID WC  . isosorbide mononitrate  30 mg Oral Daily  . latanoprost  1 drop Both Eyes QHS  . sodium chloride flush  3 mL Intravenous Q12H   Continuous Infusions: . sodium chloride Stopped (10/09/15 0622)  . sodium chloride 1 mL/kg/hr (10/09/15 0720)  . heparin Stopped (10/09/15 0839)   PRN Meds:.sodium chloride, acetaminophen, hydrALAZINE, morphine injection, nitroGLYCERIN, ondansetron (ZOFRAN) IV, sodium  chloride flush   Imaging:   10/08/15   Nuclear Result    There was no ST segment deviation noted during stress.  Defect 1: There is a small defect of moderate severity present in the basal anteroseptal, mid anteroseptal and apical septal location.  Findings consistent with ischemia.  This is an intermediate risk study.  The left ventricular ejection fraction is normal ( 59%)       Cardiac Studies: echo 10/07/15 Study Conclusions  - Left ventricle: The cavity size was normal. There was mild   concentric hypertrophy. Systolic function was normal. The   estimated ejection fraction was in the range of 60% to 65%. Mild   hypokinesis of the anteroseptal myocardium. Doppler parameters   are consistent with abnormal left ventricular relaxation (grade 1   diastolic dysfunction). Doppler parameters are consistent with   indeterminate ventricular filling pressure. - Aortic valve: Transvalvular velocity was within the normal range.   There was no stenosis. There was no regurgitation. - Mitral valve: Transvalvular velocity was within the normal range.   There was no evidence for stenosis. There was mild regurgitation. - Right ventricle: The cavity size was normal. Wall thickness was   normal. Systolic function  was normal. - Tricuspid valve: There was mild regurgitation. - Pulmonary arteries: Systolic pressure was within the normal   range. PA peak pressure: 24 mm Hg (S).   Assessment/Plan:  82 year old gentleman with a history of CAD, s/p PTCA/stenting of his mid RCA in December 2001, and CABG x3 surgery in 12/27/2005 with a LIMA graft to the LAD, a vein graft to the obtuse marginal, and a vein graft to the PDA.  He has a history of hypertension, type 2 diabetes mellitus, and hyperlipidemia (LDL 200) with statin and Zetia intolerrance. He could not afford PCSK9. He has had exertional fatigue and dyspnea. He does not have chest pain as an anginal symptom. He was admitted and Troponin  level was elevated.   Active Problems:   Diabetes mellitus without complication (HCC)   Hyperlipidemia   Coronary atherosclerosis   Elevated troponin   Essential hypertension   Exertional dyspnea-? anginal equivalent  1.  CAD: The patient is status post PCI to his mid RCA in 2001 and CABG surgery 3 in 2007.  He's not had a cardiac catheterization since his bypass surgery.  His last nuclear perfusion study in March 2015 at the [facility] revealed normal perfusion and LV function.  The patient presented hypertensive and amlodipine was added to his medical regimen; Imdur was added.  Lexiscan myoview scan done yesterday suggests anterior ischemia as noted above.  Definitive cath scheduled for today. The risks and benefits of a cardiac catheterization including, but not limited to, death, stroke, MI, kidney damage and bleeding were discussed with the patient who indicates understanding and agrees to proceed.  Discussed with patient and wife.   2.  Essential hypertension: Patient presented with systolic blood pressure greater than 160 despite being on carvedilol and very low-dose valsartan.  Amlodipine was added to his medical regimen.  Will also add nitrates.  If renal function remained stable consider titration of very low-dose valsartan.  3.  Hyperlipidemia: Apparently the patient is intolerant to statins and Zetia.  He never pursued PCSK9 inhibition, due to co-pay cost. Chol 283 and LDL 214.  Will need to re-address PCSK9 Rx.   4. Type 2 diabetes mellitus on metformin.  Recommend hemoglobin A1c evaluation   Troy Sine, MD, [name] 10/09/2015 9:06 AM

## 2015-10-09 NOTE — Progress Notes (Signed)
Pt I cath lab holding via [place]. Awake , alert, oriented. No C/O pain SB at 59 . BP 148/54, O2 sat 96 on room air. Awaiting cardiac cath.

## 2015-10-09 NOTE — Progress Notes (Signed)
Discharged home, instruction reviewed with pt and wife acknowledge understanding and questions addressed. SRP RN

## 2015-10-10 ENCOUNTER — Encounter (HOSPITAL_COMMUNITY): Payer: Self-pay | Admitting: Interventional Cardiology

## 2015-10-22 ENCOUNTER — Telehealth: Payer: Self-pay | Admitting: Cardiovascular Disease

## 2015-10-22 NOTE — Telephone Encounter (Signed)
Spoke with patient and wife who called to ask about treatment plan since last heart cath on 8/15.  He states he cannot tolerate amlodipine or isosorbide mononitrate.  He states he feels "terrible" when he takes both of these medications.  He states he only took amlodipine x 1 because of low blood pressure.  States BP has been [id number] systolic, 0000000 diastolic.  He states he has taken several doses of  the isosorbide and it makes him feel "terrible." I advised him that I will schedule him to see either Dr. Acie Fredrickson or one of the APP's this week.  I advised I will call back with an appointment.  Patient and wife verbalized understanding and agreement and thanked me for the call.

## 2015-10-22 NOTE — Telephone Encounter (Signed)
New message   Pt wife verbalized that she is calling for rn to tell her the pt results of his cath

## 2015-10-23 NOTE — Telephone Encounter (Signed)
Spoke with patient to offer him an appointment to see PA tomorrow as Dr. Acie Fredrickson will be in the office but does not have any openings this week.  He states he would prefer to see Dr. Acie Fredrickson and will continue to wait for a cancellation.  He thanked me for the call.

## 2015-10-24 NOTE — Telephone Encounter (Signed)
Will you call Mr. Folgar and have him come in Thursday at 9:30

## 2015-10-24 NOTE — Telephone Encounter (Signed)
Patient and wife informed and verbalized understanding

## 2015-10-25 ENCOUNTER — Encounter: Payer: Self-pay | Admitting: Cardiovascular Disease

## 2015-10-25 ENCOUNTER — Ambulatory Visit (INDEPENDENT_AMBULATORY_CARE_PROVIDER_SITE_OTHER): Payer: Medicare Other | Admitting: Cardiovascular Disease

## 2015-10-25 ENCOUNTER — Encounter (INDEPENDENT_AMBULATORY_CARE_PROVIDER_SITE_OTHER): Payer: Self-pay

## 2015-10-25 VITALS — BP 128/60 | HR 61 | Ht 67.0 in | Wt 150.8 lb

## 2015-10-25 DIAGNOSIS — I214 Non-ST elevation (NSTEMI) myocardial infarction: Secondary | ICD-10-CM | POA: Diagnosis not present

## 2015-10-25 DIAGNOSIS — I10 Essential (primary) hypertension: Secondary | ICD-10-CM | POA: Diagnosis not present

## 2015-10-25 DIAGNOSIS — I25119 Atherosclerotic heart disease of native coronary artery with unspecified angina pectoris: Secondary | ICD-10-CM | POA: Diagnosis not present

## 2015-10-25 NOTE — Patient Instructions (Signed)
Medication Instructions:  Your physician recommends that you continue on your current medications as directed. Please refer to the Current Medication list given to you today.   Labwork: None Ordered   Testing/Procedures: None Ordered   Follow-Up: Your physician recommends that you schedule a follow-up appointment in: 3 months with Dr. Nahser   If you need a refill on your cardiac medications before your next appointment, please call your pharmacy.   Thank you for choosing [facility]! Allianna Beaubien, RN [phone]    

## 2015-10-25 NOTE — Progress Notes (Signed)
Cardiology Office Note   Date:  10/25/2015   ID:  Johnny Peek., DOB 30-Nov-1932, MRN [id number]  PCP:  [name], MD  Cardiologist:   [name], MD   Chief Complaint  Patient presents with  . Coronary Artery Disease   1. Coronary artery disease-status post CABG in 2007 2. Diabetes mellitus 3. Hyperlipidemia 4. Hypertension   History of Present Illness:  82 yo gentleman with a hx of CAD - s/p CABG, HTN,   He's had some recent episodes of dizziness. His blood pressure has also been elevated. He felt poorly. He denies any chest pain or shortness breath. He had lots of diaphoresis. He called me last week complaining of not feeling well. It was difficult for him to describe exactly what his symptoms were but he did not describe any chest pain or shortness breath. He did have the sensation of flushing and also had profound diaphoresis. His blood pressure was noted to be elevated. He was seen by [name] here in the office. She increase his carvedilol and he presents today for further evaluation.  He 's feeling somewhat fatigued today. He complains of left arm and left leg numbness. He was walking up until 4-5 weeks ago ( 50 minutes a day 5-6 days a week without problems)  He has restarted his Aspirin and his coreg. He has not had any angina.   August 25, 2012:  Johnny [name] ruptured a tendon in his right leg and is still having problems. No CP   Dec. 9, 2014: Johnny [name] was found to have a superficial DVT. He's been on Lovenox for the past 7 days. He has had a kidney stone. He also has had a ruptured tendon in his right leg earlier this year. He has not as active.  His wife states that he is tired. He has lost 40 lbs this past 6 months. I suspect that these 2 complaints are related. He has chills, no fevers, no sweats. He is eating OK - he quit drinking sodas which may be contributing to this weight loss. His BP is high here but his wife states that his BP is normal  and even low at home.   August 02, 2013:  Johnny [name] has continued to have lots of issues - kidney stones, fracture of hip, . No cardiac issues.  He's been going to the [facility]. He's had an echocardiogram which revealed normal left and her systolic function with an EF of 55%. He had mild aortic sclerosis. He has trace mitral regurgitation and mild tricuspid regurgitation. He also has had a pharmacological stress Myoview study at the [place] which was unremarkable.   Dec. 4, 2015:  Johnny [name] is a 83 y.o. man who I seen for CAD and HTN. He also has a history of hyperlipidemia and diabetes mellitus. Everything seems to be going much better. Walking daily on a treadmill . Feels well on the treadmill.  Had a carotid duplex that showed mild irreg.    Jul 13, 2014:  Johnny [name]. is a 82 y.o. male who presents for follow up of his CAD He is doing well.  No CP Was seen with his wife.  Memory seems to be challenged at times Wants to start going back to the [facility] office.   Nov. 15 ,2016:  Johnny [name] is seen today for follow up of his CAD  Nov. 15, 2016:  Johnny [name] is doing ok.    No CP  BP  Has been OK Is  a bit high today   Jul 20, 2015:  Johnny [name] is doing  Well from a cardiac standpoint Has had some podiatry work ,    Aug. 31, 2017: Johnny [name] was admitted for extreme hypertension. He had positive cardiac enzymes consistent with a non-ST segment elevation myocardial infarction. Cardia catheterization revealed mild to moderate irregularities.  He was started on isosorbide but developed lightheadedness and a headache.   Has not been exercising    Past Medical History:  Diagnosis Date  . CORONARY ARTERY DISEASE Nov 2007   CABG x3   . DIABETES MELLITUS, TYPE II   . Hip fracture, right ([place])   . History of kidney stones   . HYPERLIPIDEMIA    Not able to take statins.  [name] HYPERTENSION   . Kidney stone 2015    Past Surgical History:  Procedure Laterality Date  . CARDIAC CATHETERIZATION N/A  10/09/2015   Procedure: Left Heart Cath and Cors/Grafts Angiography;  Surgeon: Jettie Booze, MD;  Location: [facility];  Service: Cardiovascular;  Laterality: N/A;  . CORONARY ANGIOPLASTY WITH STENT PLACEMENT  01/09/2006   Three-vessel CAD  His right coronary artery is extremely tight.  He has moderate to severe disease in the LAD & left circumflex artery.  The LAD is very heavily calcified and the proximal and ostial LAD is not a good target for PTCA.  We will refer him to CVTS for further evaluation  . CORONARY ANGIOPLASTY WITH STENT PLACEMENT  02/14/2000   EF - of around 65-70%./PTCA and stenting of his mid right  coronary artery (4.0 x 18 mm NIR stent to the mid RCA).  He was noted  to have a 50% ostial stenosis at the time of his heart catheterization   . CORONARY ARTERY BYPASS GRAFT  01/17/2006   x3 using a left internal mammary artery graft to left anterior descending coronary artery, saphenous vein graft to obtuse marginal branch, saphenous vein graft to posterior descending branch / Endoscopic vein harvesting from the right leg.  [name] GASTROCNEMIUS RECESSION  03/11/2012   Procedure: GASTROCNEMIUS SLIDE;  Surgeon: Wylene Simmer, MD;  Location: [facility];  Service: Orthopedics;  Laterality: Right;  . HIP FRACTURE SURGERY     right  . TENDON REPAIR  03/11/2012   Procedure: TENDON REPAIR;  Surgeon: Wylene Simmer, MD;  Location: [facility];  Service: Orthopedics;  Laterality: Right;  Right Tibialis Tendon Repair With Gastrocnemius Recession; Possible PLANTARIS AUTOGRAFT   . TONSILLECTOMY    . TONSILLECTOMY    . VASECTOMY       Current Outpatient Prescriptions  Medication Sig Dispense Refill  . aspirin 81 MG tablet Take 81 mg by mouth every evening.     . carvedilol (COREG) 6.25 MG tablet Take 3.125-6.25 mg by mouth 2 (two) times daily with a meal. Take 6.25 in the morning and 3.125mg  at night    . Flaxseed, Linseed, POWD Take 1 tablet by mouth daily.      . metFORMIN (GLUCOPHAGE-XR) 500 MG 24 hr tablet Take 500 mg by mouth daily with breakfast.    . nitroGLYCERIN (NITROSTAT) 0.4 MG SL tablet Place 1 tablet (0.4 mg total) under the tongue every 5 (five) minutes as needed for chest pain. 25 tablet 6  . Travoprost, BAK Free, (TRAVATAN) 0.004 % SOLN ophthalmic solution Place 1 drop into both eyes at bedtime.    . valsartan (DIOVAN) 40 MG tablet Take 1 tablet (40 mg total) by mouth daily. 30 tablet 0  No current facility-administered medications for this visit.     Allergies:   Cozaar; Ace inhibitors; Crestor [rosuvastatin calcium]; Lipitor [atorvastatin calcium]; Lisinopril; Lovastatin; Pravastatin; Zetia [ezetimibe]; Zocor [simvastatin - high dose]; and Latex    Social History:  The patient  reports that he quit smoking about 50 years ago. His smoking use included Cigarettes. He has never used smokeless tobacco. He reports that he does not drink alcohol or use drugs.   Family History:  The patient's family history includes Heart attack in his father; Stroke in his mother.    ROS:  Please see the history of present illness.    Review of Systems: Constitutional:  denies fever, chills, diaphoresis, appetite change and fatigue.  HEENT: denies photophobia, eye pain, redness, hearing loss, ear pain, congestion, sore throat, rhinorrhea, sneezing, neck pain, neck stiffness and tinnitus.  Respiratory: denies SOB, DOE, cough, chest tightness, and wheezing.  Cardiovascular: denies chest pain, palpitations and leg swelling.  Gastrointestinal: denies nausea, vomiting, abdominal pain, diarrhea, constipation, blood in stool.  Genitourinary: denies dysuria, urgency, frequency, hematuria, flank pain and difficulty urinating.  Musculoskeletal: denies  myalgias, back pain, joint swelling, arthralgias and gait problem.   Skin: denies pallor, rash and wound.  Neurological: denies dizziness, seizures, syncope, weakness, light-headedness, numbness and headaches.    Hematological: denies adenopathy, easy bruising, personal or family bleeding history.  Psychiatric/ Behavioral: denies suicidal ideation, mood changes, confusion, nervousness, sleep disturbance and agitation.       All other systems are reviewed and negative.    PHYSICAL EXAM: VS:  BP 128/60   Pulse 61   Ht 5\' 7"  (1.702 m)   Wt 150 lb 12.8 oz (68.4 kg)   SpO2 98%   BMI 23.62 kg/m  , BMI Body mass index is 23.62 kg/m. GEN: Well nourished, well developed, in no acute distress  HEENT: normal  Neck: no JVD, carotid bruits, or masses Cardiac: RRR; no murmurs, rubs, or gallops,no edema  Respiratory:  clear to auscultation bilaterally, normal work of breathing GI: soft, nontender, nondistended, + BS MS: no deformity or atrophy  Skin: warm and dry, no rash Neuro:  Strength and sensation are intact Psych: normal   EKG:  EKG is ordered today. The ekg ordered today demonstrates ;  Sinus brady at 59/  Otherwise normal    Recent Labs: 10/06/2015: ALT 12 10/07/2015: B Natriuretic Peptide 88.5 10/09/2015: BUN 18; Creatinine, Ser 0.98; Hemoglobin 13.8; Platelets 262; Potassium 4.1; Sodium 138    Lipid Panel    Component Value Date/Time   CHOL 283 (H) 10/07/2015 0228   CHOL 252 (H) 07/10/2014 0920   TRIG 154 (H) 10/07/2015 0228   HDL 38 (L) 10/07/2015 0228   HDL 31 (L) 07/10/2014 0920   CHOLHDL 7.4 10/07/2015 0228   VLDL 31 10/07/2015 0228   LDLCALC 214 (H) 10/07/2015 0228   LDLCALC 158 (H) 07/10/2014 0920   LDLDIRECT 167.1 08/18/2012 0916      Wt Readings from Last 3 Encounters:  10/25/15 150 lb 12.8 oz (68.4 kg)  10/07/15 148 lb 9.6 oz (67.4 kg)  07/20/15 156 lb 6.4 oz (70.9 kg)      Other studies Reviewed: Additional studies/ records that were reviewed today include: . Review of the above records demonstrates:    ASSESSMENT AND PLAN:  1. Coronary artery disease- He is status post coronary artery bypass grafting in 2007.  He was recently admitted to the  hospital in August, 2017 with severe hypertension and troponin elevations consistent with an NSTEMI.  He was treated medically. He's done fairly well but he did not tolerate the isosorbide. At this time we will discontinue the isosorbide and continue to follow him. I have encouraged him to watch his salt intake. He will start a regular exercise program.   2. Diabetes mellitus - further management from his internal medicine doctor.  3. Hyperlipidemia - lipds are elevated.  Does not tolerate statins or Zetia.  He should consider Repatha  He is followed at the [place]   4. Hypertension - blood pressure levels overall  Have been okay.  Current medicines are reviewed at length with the patient today.  The patient does not have concerns regarding medicines.  The following changes have been made:  no change  Labs/ tests ordered today include:  No orders of the defined types were placed in this encounter.   Disposition:   FU with me in 3 months .    [name], MD  10/25/2015 9:46 AM    [facility] [place], [place], [place]  [address] Phone: [phone]; Fax: [phone]   [facility]  [address] [place], [place]  [address] [phone]    Fax [phone]

## 2015-10-30 ENCOUNTER — Telehealth: Payer: Self-pay | Admitting: Cardiovascular Disease

## 2015-10-30 NOTE — Telephone Encounter (Signed)
Spoke with wife who is requesting written notification be sent to the [place] to make them aware the patient does not need to have Isosorbide or Amlodipine filled.  They do not want to pay for it since he is unable to tolerate them.  Requested information of who I should contact - what phone number or fax number to send this information to.  Advised we would need the phone or fax number for the pharmacy in order to attempt to cancel a RX.  Advised wife last office visit note by Dr Acie Fredrickson would have been sent to PCP that would list pt's most recent medication but that wouldn't cancel the rx in the pharmacy.  She seemed upset that I was asking her questions as to whom to contact.  Advised Dr Acie Fredrickson did not write or send rx for medications, that they were ordered in the hospital by another MD.  She requested Sharyn Lull call her back tomorrow or the next day she is in the office.  Advised I will forward information to Sharyn Lull to have her c/b.

## 2015-10-30 NOTE — Telephone Encounter (Signed)
New message   Pt wife verbalized that pt was in the hospital and that some prescriptions was ordered, they have faxed the prescriptions over to the [facility] 30mg   Amlodipine 5mg   She was told by Dr.Nasher that he do not need to take these medication. Pt wife stated that she wants a fax sent to the [place] so they wont be charged for the medications

## 2015-10-31 NOTE — Telephone Encounter (Signed)
Spoke with patient's wife, [name], who asked that we send documentation to Dr. Myna Hidalgo office that Dr. Acie Fredrickson has d/c'ed amlodipine and imdur.   Fax: [phone] Phone:  [phone]  Nurse is Neldon Newport  I advised that I will send documentation today.  She thanked me for my help.  Paperwork placed in HIM to be faxed

## 2016-01-16 ENCOUNTER — Encounter: Payer: Self-pay | Admitting: *Deleted

## 2016-01-25 ENCOUNTER — Encounter: Payer: Self-pay | Admitting: Cardiovascular Disease

## 2016-01-25 ENCOUNTER — Encounter (INDEPENDENT_AMBULATORY_CARE_PROVIDER_SITE_OTHER): Payer: Self-pay

## 2016-01-25 ENCOUNTER — Ambulatory Visit (INDEPENDENT_AMBULATORY_CARE_PROVIDER_SITE_OTHER): Payer: Medicare Other | Admitting: Cardiovascular Disease

## 2016-01-25 VITALS — BP 125/75 | HR 60 | Ht 67.0 in | Wt 157.0 lb

## 2016-01-25 DIAGNOSIS — E782 Mixed hyperlipidemia: Secondary | ICD-10-CM

## 2016-01-25 DIAGNOSIS — I10 Essential (primary) hypertension: Secondary | ICD-10-CM

## 2016-01-25 DIAGNOSIS — I25119 Atherosclerotic heart disease of native coronary artery with unspecified angina pectoris: Secondary | ICD-10-CM

## 2016-01-25 NOTE — Patient Instructions (Signed)
Medication Instructions:  Your physician recommends that you continue on your current medications as directed. Please refer to the Current Medication list given to you today.   Labwork: None Ordered   Testing/Procedures: None Ordered   Follow-Up: Your physician wants you to follow-up in: 6 months with Dr. Nahser.  You will receive a reminder letter in the mail two months in advance. If you don't receive a letter, please call our office to schedule the follow-up appointment.   If you need a refill on your cardiac medications before your next appointment, please call your pharmacy.   Thank you for choosing [facility]! Denika Krone, RN [phone]    

## 2016-01-25 NOTE — Progress Notes (Signed)
Cardiology Office Note   Date:  01/25/2016   ID:  Johnny Peek., DOB 09/28/1932, MRN [id number]  PCP:  [name], MD  Cardiologist:   [name], MD   Chief Complaint  Patient presents with  . Coronary Artery Disease   1. Coronary artery disease-status post CABG in 2007 2. Diabetes mellitus 3. Hyperlipidemia 4. Hypertension   82 yo gentleman with a hx of CAD - s/p CABG, HTN,   He's had some recent episodes of dizziness. His blood pressure has also been elevated. He felt poorly. He denies any chest pain or shortness breath. He had lots of diaphoresis. He called me last week complaining of not feeling well. It was difficult for him to describe exactly what his symptoms were but he did not describe any chest pain or shortness breath. He did have the sensation of flushing and also had profound diaphoresis. His blood pressure was noted to be elevated. He was seen by [name] here in the office. She increase his carvedilol and he presents today for further evaluation.  He 's feeling somewhat fatigued today. He complains of left arm and left leg numbness. He was walking up until 4-5 weeks ago ( 50 minutes a day 5-6 days a week without problems)  He has restarted his Aspirin and his coreg. He has not had any angina.   August 25, 2012:  Johnny [name] ruptured a tendon in his right leg and is still having problems. No CP   Dec. 9, 2014: Johnny [name] was found to have a superficial DVT. He's been on Lovenox for the past 7 days. He has had a kidney stone. He also has had a ruptured tendon in his right leg earlier this year. He has not as active.  His wife states that he is tired. He has lost 40 lbs this past 6 months. I suspect that these 2 complaints are related. He has chills, no fevers, no sweats. He is eating OK - he quit drinking sodas which may be contributing to this weight loss. His BP is high here but his wife states that his BP is normal and even low at home.   August 02, 2013:  Johnny [name] has continued to have lots of issues - kidney stones, fracture of hip, . No cardiac issues.  He's been going to the [facility]. He's had an echocardiogram which revealed normal left and her systolic function with an EF of 55%. He had mild aortic sclerosis. He has trace mitral regurgitation and mild tricuspid regurgitation. He also has had a pharmacological stress Myoview study at the [place] which was unremarkable.   Dec. 4, 2015:  Johnny [name] is a 82 y.o. man who I seen for CAD and HTN. He also has a history of hyperlipidemia and diabetes mellitus. Everything seems to be going much better. Walking daily on a treadmill . Feels well on the treadmill.  Had a carotid duplex that showed mild irreg.    Jul 13, 2014:  Johnny [name]. is a 82 y.o. male who presents for follow up of his CAD He is doing well.  No CP Was seen with his wife.  Memory seems to be challenged at times Wants to start going back to the [facility] office.   Nov. 15 ,2016:  Johnny [name] is seen today for follow up of his CAD  Nov. 15, 2016:  Johnny [name] is doing ok.    No CP  BP  Has been OK Is a bit high today  Jul 20, 2015:  Johnny [name] is doing  Well from a cardiac standpoint Has had some podiatry work ,    Aug. 31, 2017: Johnny [name] was admitted for extreme hypertension. He had positive cardiac enzymes consistent with a non-ST segment elevation myocardial infarction. Cardia catheterization revealed mild to moderate irregularities.  He was started on isosorbide but developed lightheadedness and a headache.   Has not been exercising    Dec. 1, 2017 Johnny [name] is seen today  Continues to be very tired.   Is not walking  No angina .    He seems to just not want to go out and exercise     Past Medical History:  Diagnosis Date  . CORONARY ARTERY DISEASE Nov 2007   CABG x3   . DIABETES MELLITUS, TYPE II   . Hip fracture, right ([place])   . History of kidney stones   . HYPERLIPIDEMIA    Not able to take statins.  [name]  HYPERTENSION   . Kidney stone 2015    Past Surgical History:  Procedure Laterality Date  . CARDIAC CATHETERIZATION N/A 10/09/2015   Procedure: Left Heart Cath and Cors/Grafts Angiography;  Surgeon: Jettie Booze, MD;  Location: [place] [facility];  Service: Cardiovascular;  Laterality: N/A;  . CORONARY ANGIOPLASTY WITH STENT PLACEMENT  01/09/2006   Three-vessel CAD  His right coronary artery is extremely tight.  He has moderate to severe disease in the LAD & left circumflex artery.  The LAD is very heavily calcified and the proximal and ostial LAD is not a good target for PTCA.  We will refer him to CVTS for further evaluation  . CORONARY ANGIOPLASTY WITH STENT PLACEMENT  02/14/2000   EF - of around 65-70%./PTCA and stenting of his mid right  coronary artery (4.0 x 18 mm NIR stent to the mid RCA).  He was noted  to have a 50% ostial stenosis at the time of his heart catheterization   . CORONARY ARTERY BYPASS GRAFT  01/17/2006   x3 using a left internal mammary artery graft to left anterior descending coronary artery, saphenous vein graft to obtuse marginal branch, saphenous vein graft to posterior descending branch / Endoscopic vein harvesting from the right leg.  [name] GASTROCNEMIUS RECESSION  03/11/2012   Procedure: GASTROCNEMIUS SLIDE;  Surgeon: Wylene Simmer, MD;  Location: [place];  Service: Orthopedics;  Laterality: Right;  . HIP FRACTURE SURGERY     right  . TENDON REPAIR  03/11/2012   Procedure: TENDON REPAIR;  Surgeon: Wylene Simmer, MD;  Location: [place];  Service: Orthopedics;  Laterality: Right;  Right Tibialis Tendon Repair With Gastrocnemius Recession; Possible PLANTARIS AUTOGRAFT   . TONSILLECTOMY    . TONSILLECTOMY    . VASECTOMY       Current Outpatient Prescriptions  Medication Sig Dispense Refill  . aspirin 81 MG tablet Take 81 mg by mouth every evening.     . carvedilol (COREG) 6.25 MG tablet Take 3.125-6.25 mg by mouth 2 (two) times  daily with a meal. Take 6.25 in the morning and 3.125mg  at night    . Flaxseed, Linseed, POWD Take 1 tablet by mouth daily.     . metFORMIN (GLUCOPHAGE-XR) 500 MG 24 hr tablet Take 500 mg by mouth daily with breakfast.    . nitroGLYCERIN (NITROSTAT) 0.4 MG SL tablet Place 1 tablet (0.4 mg total) under the tongue every 5 (five) minutes as needed for chest pain. 25 tablet 6  . Travoprost, BAK Free, (TRAVATAN)  0.004 % SOLN ophthalmic solution Place 1 drop into both eyes at bedtime.    . valsartan (DIOVAN) 40 MG tablet Take 1 tablet (40 mg total) by mouth daily. 30 tablet 0   No current facility-administered medications for this visit.     Allergies:   Cozaar; Ace inhibitors; Crestor [rosuvastatin calcium]; Lipitor [atorvastatin calcium]; Lisinopril; Lovastatin; Pravastatin; Zetia [ezetimibe]; Zocor [simvastatin - high dose]; and Latex    Social History:  The patient  reports that he quit smoking about 50 years ago. His smoking use included Cigarettes. He has never used smokeless tobacco. He reports that he does not drink alcohol or use drugs.   Family History:  The patient's family history includes Heart attack in his father; Stroke in his mother.    ROS:  Please see the history of present illness.    Review of Systems: Constitutional:  denies fever, chills, diaphoresis, appetite change and fatigue.  HEENT: denies photophobia, eye pain, redness, hearing loss, ear pain, congestion, sore throat, rhinorrhea, sneezing, neck pain, neck stiffness and tinnitus.  Respiratory: denies SOB, DOE, cough, chest tightness, and wheezing.  Cardiovascular: denies chest pain, palpitations and leg swelling.  Gastrointestinal: denies nausea, vomiting, abdominal pain, diarrhea, constipation, blood in stool.  Genitourinary: denies dysuria, urgency, frequency, hematuria, flank pain and difficulty urinating.  Musculoskeletal: denies  myalgias, back pain, joint swelling, arthralgias and gait problem.   Skin: denies  pallor, rash and wound.  Neurological: denies dizziness, seizures, syncope, weakness, light-headedness, numbness and headaches.   Hematological: denies adenopathy, easy bruising, personal or family bleeding history.  Psychiatric/ Behavioral: denies suicidal ideation, mood changes, confusion, nervousness, sleep disturbance and agitation.       All other systems are reviewed and negative.    PHYSICAL EXAM: VS:  BP 125/75   Pulse 60   Ht 5\' 7"  (1.702 m)   Wt 157 lb (71.2 kg)   BMI 24.59 kg/m  , BMI Body mass index is 24.59 kg/m. GEN: Well nourished, well developed, in no acute distress  HEENT: normal  Neck: no JVD, carotid bruits, or masses Cardiac: RRR; no murmurs, rubs, or gallops,no edema  Respiratory:  clear to auscultation bilaterally, normal work of breathing GI: soft, nontender, nondistended, + BS MS: no deformity or atrophy  Skin: warm and dry, no rash Neuro:  Strength and sensation are intact Psych: normal   EKG:  EKG is ordered today. The ekg ordered today demonstrates ;  Sinus brady at 59/  Otherwise normal    Recent Labs: 10/06/2015: ALT 12 10/07/2015: B Natriuretic Peptide 88.5 10/09/2015: BUN 18; Creatinine, Ser 0.98; Hemoglobin 13.8; Platelets 262; Potassium 4.1; Sodium 138    Lipid Panel    Component Value Date/Time   CHOL 283 (H) 10/07/2015 0228   CHOL 252 (H) 07/10/2014 0920   TRIG 154 (H) 10/07/2015 0228   HDL 38 (L) 10/07/2015 0228   HDL 31 (L) 07/10/2014 0920   CHOLHDL 7.4 10/07/2015 0228   VLDL 31 10/07/2015 0228   LDLCALC 214 (H) 10/07/2015 0228   LDLCALC 158 (H) 07/10/2014 0920   LDLDIRECT 167.1 08/18/2012 0916      Wt Readings from Last 3 Encounters:  01/25/16 157 lb (71.2 kg)  10/25/15 150 lb 12.8 oz (68.4 kg)  10/07/15 148 lb 9.6 oz (67.4 kg)      Other studies Reviewed: Additional studies/ records that were reviewed today include: . Review of the above records demonstrates:    ASSESSMENT AND PLAN:  1. Coronary artery  disease- He is status  post coronary artery bypass grafting in 2007.  He was recently admitted to the hospital in August, 2017 with severe hypertension and troponin elevations consistent with an NSTEMI.  He was treated medically. He's done fairly well but he did not tolerate the isosorbide. At this time we will discontinue the isosorbide and continue to follow him. I have encouraged him to watch his salt intake. He will start a regular exercise program.  2. Diabetes mellitus - further management from his internal medicine doctor.  3. Hyperlipidemia - lipds are elevated.  Does not tolerate statins or Zetia.  He should consider Repatha  He is followed at the [place]   4. Hypertension - blood pressure levels overall  Have been okay.  Current medicines are reviewed at length with the patient today.  The patient does not have concerns regarding medicines.  The following changes have been made:  no change  Labs/ tests ordered today include:  No orders of the defined types were placed in this encounter.   Disposition:   FU with me in 3 months .    [name], MD  01/25/2016 10:34 AM    [facility] [address], [place], [place]  [address] Phone: [phone]; Fax: [phone]   [facility]  [address], [place]  [address] [phone]    Fax [phone]

## 2016-03-13 ENCOUNTER — Telehealth: Payer: Self-pay | Admitting: Pharmacist

## 2016-03-13 NOTE — Telephone Encounter (Signed)
Spoke with pt regarding lipid management. Pt was previously approved for Repatha injections, however the copay was too expensive and pt was unable to afford therapy. He is also intolerant to Zetia (GI upset) and multiple statins, although we do not have records of particular statins because the [place] also followed pt's cholesterol.  Patient would likely be a good candidate for the ORION-10 study given his history of CAD s/p CABG and NSTEMI in August 2017. His most recent LDL was 214mg /dL at the time of his MI, far above LDL goal 70mg /dL. Patient is very interested in learning more about clinical trials. Will forward this information to our research nurses to further screen pt for ORION, as well as to Dr Acie Fredrickson to see if he is in agreement with pursuing clinical lipid trials given a lack of other treatment options at this time.

## 2016-03-13 NOTE — Telephone Encounter (Signed)
I agree with enrolling him in the [place] trial  Thanks

## 2016-03-18 ENCOUNTER — Telehealth: Payer: Self-pay

## 2016-03-18 NOTE — Telephone Encounter (Signed)
Johnny [name] has an appointment on 04/08/2016 with the Cardiology Research Office to discuss and potentially enroll in the [facility].

## 2016-03-28 ENCOUNTER — Encounter: Payer: Self-pay | Admitting: *Deleted

## 2016-03-28 DIAGNOSIS — Z006 Encounter for examination for normal comparison and control in clinical research program: Secondary | ICD-10-CM

## 2016-03-28 NOTE — Telephone Encounter (Signed)
Johnny [name] called the Research Office this morning and would prefer not to enroll in any Research Studies at this time.

## 2016-03-28 NOTE — Progress Notes (Signed)
Johnny [name] called the Research Office this morning and has declined to participate in any Research studies at this time.

## 2016-10-16 ENCOUNTER — Encounter: Payer: Self-pay | Admitting: Cardiovascular Disease

## 2016-10-16 ENCOUNTER — Ambulatory Visit (INDEPENDENT_AMBULATORY_CARE_PROVIDER_SITE_OTHER): Payer: Medicare Other | Admitting: Cardiovascular Disease

## 2016-10-16 VITALS — BP 124/52 | HR 60 | Ht 67.0 in | Wt 150.8 lb

## 2016-10-16 DIAGNOSIS — I2510 Atherosclerotic heart disease of native coronary artery without angina pectoris: Secondary | ICD-10-CM | POA: Diagnosis not present

## 2016-10-16 DIAGNOSIS — E782 Mixed hyperlipidemia: Secondary | ICD-10-CM

## 2016-10-16 LAB — HEPATIC FUNCTION PANEL
ALBUMIN: 4.1 g/dL (ref 3.5–4.7)
ALT: 7 IU/L (ref 0–44)
AST: 11 IU/L (ref 0–40)
Alkaline Phosphatase: 89 IU/L (ref 39–117)
BILIRUBIN TOTAL: 0.4 mg/dL (ref 0.0–1.2)
Bilirubin, Direct: 0.11 mg/dL (ref 0.00–0.40)
TOTAL PROTEIN: 6.2 g/dL (ref 6.0–8.5)

## 2016-10-16 LAB — BASIC METABOLIC PANEL
BUN/Creatinine Ratio: 18 (ref 10–24)
BUN: 16 mg/dL (ref 8–27)
CALCIUM: 10.7 mg/dL — AB (ref 8.6–10.2)
CO2: 22 mmol/L (ref 20–29)
CREATININE: 0.91 mg/dL (ref 0.76–1.27)
Chloride: 98 mmol/L (ref 96–106)
GFR calc Af Amer: 90 mL/min/{1.73_m2} (ref >59)
GFR calc non Af Amer: 78 mL/min/{1.73_m2} (ref >59)
GLUCOSE: 142 mg/dL — AB (ref 65–99)
POTASSIUM: 4.5 mmol/L (ref 3.5–5.2)
SODIUM: 135 mmol/L (ref 134–144)

## 2016-10-16 LAB — LIPID PANEL
CHOLESTEROL TOTAL: 207 mg/dL — AB (ref 100–199)
Chol/HDL Ratio: 6.1 ratio — ABNORMAL HIGH (ref 0.0–5.0)
HDL: 34 mg/dL — ABNORMAL LOW (ref >39)
LDL Calculated: 129 mg/dL — ABNORMAL HIGH (ref 0–99)
Triglycerides: 219 mg/dL — ABNORMAL HIGH (ref 0–149)
VLDL Cholesterol Cal: 44 mg/dL — ABNORMAL HIGH (ref 5–40)

## 2016-10-16 NOTE — Progress Notes (Signed)
Cardiology Office Note   Date:  10/16/2016   ID:  Johnny Stacey., DOB May 17, 1932, MRN [id number]  PCP:  [name], MD  Cardiologist:   [name], MD   Chief Complaint  Patient presents with  . Follow-up    CAD   1. Coronary artery disease-status post CABG in 2007 2. Diabetes mellitus 3. Hyperlipidemia 4. Hypertension   83 yo gentleman with a hx of CAD - s/p CABG, HTN,   He's had some recent episodes of dizziness. His blood pressure has also been elevated. He felt poorly. He denies any chest pain or shortness breath. He had lots of diaphoresis. He called me last week complaining of not feeling well. It was difficult for him to describe exactly what his symptoms were but he did not describe any chest pain or shortness breath. He did have the sensation of flushing and also had profound diaphoresis. His blood pressure was noted to be elevated. He was seen by [name] here in the office. She increase his carvedilol and he presents today for further evaluation.  He 's feeling somewhat fatigued today. He complains of left arm and left leg numbness. He was walking up until 4-5 weeks ago ( 50 minutes a day 5-6 days a week without problems)  He has restarted his Aspirin and his coreg. He has not had any angina.   August 25, 2012:  Johnny [name] ruptured a tendon in his right leg and is still having problems. No CP   Dec. 9, 2014: Johnny [name] was found to have a superficial DVT. He's been on Lovenox for the past 7 days. He has had a kidney stone. He also has had a ruptured tendon in his right leg earlier this year. He has not as active.  His wife states that he is tired. He has lost 40 lbs this past 6 months. I suspect that these 2 complaints are related. He has chills, no fevers, no sweats. He is eating OK - he quit drinking sodas which may be contributing to this weight loss. His BP is high here but his wife states that his BP is normal and even low at home.   August 02, 2013:  Johnny [name] has continued to have lots of issues - kidney stones, fracture of hip, . No cardiac issues.  He's been going to the [facility]. He's had an echocardiogram which revealed normal left and her systolic function with an EF of 55%. He had mild aortic sclerosis. He has trace mitral regurgitation and mild tricuspid regurgitation. He also has had a pharmacological stress Myoview study at the [place] which was unremarkable.   Dec. 4, 2015:  Johnny [name] is a 83 y.o. man who I seen for CAD and HTN. He also has a history of hyperlipidemia and diabetes mellitus. Everything seems to be going much better. Walking daily on a treadmill . Feels well on the treadmill.  Had a carotid duplex that showed mild irreg.    Jul 13, 2014:  Johnny Jablon. is a 83 y.o. male who presents for follow up of his CAD He is doing well.  No CP Was seen with his wife.  Memory seems to be challenged at times Wants to start going back to the [place] office.   Nov. 15 ,2016:  Johnny [name] is seen today for follow up of his CAD  Nov. 15, 2016:  Johnny [name] is doing ok.    No CP  BP  Has been OK Is a bit high  today   Jul 20, 2015:  Johnny [name] is doing  Well from a cardiac standpoint Has had some podiatry work ,    Aug. 31, 2017: Johnny [name] was admitted for extreme hypertension. He had positive cardiac enzymes consistent with a non-ST segment elevation myocardial infarction. Cardia catheterization revealed mild to moderate irregularities.  He was started on isosorbide but developed lightheadedness and a headache.   Has not been exercising    Dec. 1, 2017 Johnny [name] is seen today  Continues to be very tired.   Is not walking  No angina .    He seems to just not want to go out and exercise   Aug. 23, 2018:  Johnny [name] is seen with his wife,  Johnny [name] today  Still tired.  Not inclined to get out and do anything.   Drinking water - perhaps not as much as he should .   Past Medical History:  Diagnosis Date  . CORONARY ARTERY DISEASE Nov  2007   CABG x3   . DIABETES MELLITUS, TYPE II   . Hip fracture, right ([place])   . History of kidney stones   . HYPERLIPIDEMIA    Not able to take statins.  [name] HYPERTENSION   . Kidney stone 2015    Past Surgical History:  Procedure Laterality Date  . CARDIAC CATHETERIZATION N/A 10/09/2015   Procedure: Left Heart Cath and Cors/Grafts Angiography;  Surgeon: Jettie Booze, MD;  Location: [place] [facility];  Service: Cardiovascular;  Laterality: N/A;  . CORONARY ANGIOPLASTY WITH STENT PLACEMENT  01/09/2006   Three-vessel CAD  His right coronary artery is extremely tight.  He has moderate to severe disease in the LAD & left circumflex artery.  The LAD is very heavily calcified and the proximal and ostial LAD is not a good target for PTCA.  We will refer him to CVTS for further evaluation  . CORONARY ANGIOPLASTY WITH STENT PLACEMENT  02/14/2000   EF - of around 65-70%./PTCA and stenting of his mid right  coronary artery (4.0 x 18 mm NIR stent to the mid RCA).  He was noted  to have a 50% ostial stenosis at the time of his heart catheterization   . CORONARY ARTERY BYPASS GRAFT  01/17/2006   x3 using a left internal mammary artery graft to left anterior descending coronary artery, saphenous vein graft to obtuse marginal branch, saphenous vein graft to posterior descending branch / Endoscopic vein harvesting from the right leg.  [name] GASTROCNEMIUS RECESSION  03/11/2012   Procedure: GASTROCNEMIUS SLIDE;  Surgeon: Wylene Simmer, MD;  Location: [place];  Service: Orthopedics;  Laterality: Right;  . HIP FRACTURE SURGERY     right  . TENDON REPAIR  03/11/2012   Procedure: TENDON REPAIR;  Surgeon: Wylene Simmer, MD;  Location: [place];  Service: Orthopedics;  Laterality: Right;  Right Tibialis Tendon Repair With Gastrocnemius Recession; Possible PLANTARIS AUTOGRAFT   . TONSILLECTOMY    . TONSILLECTOMY    . VASECTOMY       Current Outpatient Prescriptions  Medication  Sig Dispense Refill  . aspirin 81 MG tablet Take 81 mg by mouth every evening.     . carvedilol (COREG) 6.25 MG tablet Take 3.125-6.25 mg by mouth 2 (two) times daily with a meal. Take 6.25 in the morning and 3.125mg  at night    . Flaxseed, Linseed, POWD Take 1 tablet by mouth daily.     . fluvastatin (LESCOL) 20 MG capsule Take 20 mg by mouth every  other day.    . metFORMIN (GLUCOPHAGE-XR) 500 MG 24 hr tablet Take 500 mg by mouth daily with breakfast.    . nitroGLYCERIN (NITROSTAT) 0.4 MG SL tablet Place 1 tablet (0.4 mg total) under the tongue every 5 (five) minutes as needed for chest pain. 25 tablet 6  . Travoprost, BAK Free, (TRAVATAN) 0.004 % SOLN ophthalmic solution Place 1 drop into both eyes at bedtime.    . valsartan (DIOVAN) 40 MG tablet Take 1 tablet (40 mg total) by mouth daily. 30 tablet 0   No current facility-administered medications for this visit.     Allergies:   Cozaar; Ace inhibitors; Crestor [rosuvastatin calcium]; Lipitor [atorvastatin calcium]; Lisinopril; Lovastatin; Pravastatin; Zetia [ezetimibe]; Zocor [simvastatin - high dose]; and Latex    Social History:  The patient  reports that he quit smoking about 51 years ago. His smoking use included Cigarettes. He has never used smokeless tobacco. He reports that he does not drink alcohol or use drugs.   Family History:  The patient's family history includes Heart attack in his father; Stroke in his mother.    ROS:  Please see the history of present illness.    Review of Systems: Constitutional:  denies fever, chills, diaphoresis, appetite change and fatigue.  HEENT: denies photophobia, eye pain, redness, hearing loss, ear pain, congestion, sore throat, rhinorrhea, sneezing, neck pain, neck stiffness and tinnitus.  Respiratory: denies SOB, DOE, cough, chest tightness, and wheezing.  Cardiovascular: denies chest pain, palpitations and leg swelling.  Gastrointestinal: denies nausea, vomiting, abdominal pain, diarrhea,  constipation, blood in stool.  Genitourinary: denies dysuria, urgency, frequency, hematuria, flank pain and difficulty urinating.  Musculoskeletal: denies  myalgias, back pain, joint swelling, arthralgias and gait problem.   Skin: denies pallor, rash and wound.  Neurological: denies dizziness, seizures, syncope, weakness, light-headedness, numbness and headaches.   Hematological: denies adenopathy, easy bruising, personal or family bleeding history.  Psychiatric/ Behavioral: denies suicidal ideation, mood changes, confusion, nervousness, sleep disturbance and agitation.       All other systems are reviewed and negative.    PHYSICAL EXAM: VS:  BP (!) 124/52   Pulse 60   Ht 5\' 7"  (1.702 m)   Wt 150 lb 12.8 oz (68.4 kg)   BMI 23.62 kg/m  , BMI Body mass index is 23.62 kg/m. GEN: Well nourished, well developed, in no acute distress  HEENT: normal  Neck: no JVD, carotid bruits, or masses Cardiac: RRR; no murmurs, rubs, or gallops,no edema  Respiratory:  clear to auscultation bilaterally, normal work of breathing GI: soft, nontender, nondistended, + BS MS: no deformity or atrophy  Skin: warm and dry, no rash Neuro:  Strength and sensation are intact Psych: normal   EKG:  EKG is ordered today. The ekg ordered today demonstrates ; NSR at 60.   NS T wave abn.   Otherwise normal    Recent Labs: No results found for requested labs within last 8760 hours.    Lipid Panel    Component Value Date/Time   CHOL 283 (H) 10/07/2015 0228   CHOL 252 (H) 07/10/2014 0920   TRIG 154 (H) 10/07/2015 0228   HDL 38 (L) 10/07/2015 0228   HDL 31 (L) 07/10/2014 0920   CHOLHDL 7.4 10/07/2015 0228   VLDL 31 10/07/2015 0228   LDLCALC 214 (H) 10/07/2015 0228   LDLCALC 158 (H) 07/10/2014 0920   LDLDIRECT 167.1 08/18/2012 0916      Wt Readings from Last 3 Encounters:  10/16/16 150 lb 12.8 oz (  68.4 kg)  01/25/16 157 lb (71.2 kg)  10/25/15 150 lb 12.8 oz (68.4 kg)      Other studies  Reviewed: Additional studies/ records that were reviewed today include: . Review of the above records demonstrates:    ASSESSMENT AND PLAN:  1. Coronary artery disease- He is status post coronary artery bypass grafting in 2007.  He was recently admitted to the hospital in August, 2017 with severe hypertension and troponin elevations consistent with an NSTEMI.  He was treated medically. He's done fairly well but he did not tolerate the isosorbide. At this time we will discontinue the isosorbide and continue to follow him. I have encouraged him to watch his salt intake. He will start a regular exercise program.  2. Diabetes mellitus - further management from his internal medicine doctor.  3. Hyperlipidemia -,  Will check labs today   lipds are elevated.  Does not tolerate statins or Zetia.  He should consider Repatha     4. Hypertension - blood pressure levels overall  Have been okay.  5. Generalized weakness:   Still very fatigued .   Advised him to drink more water.  Getting a neuro psych eval at the [place] .   Current medicines are reviewed at length with the patient today.  The patient does not have concerns regarding medicines.  The following changes have been made:  no change  Labs/ tests ordered today include:  No orders of the defined types were placed in this encounter.   Disposition:   FU with me in 3 months .    [name], MD  10/16/2016 11:18 AM    [facility] [place], [place], [place]  [address] Phone: [phone]; Fax: [phone]

## 2016-10-16 NOTE — Patient Instructions (Signed)
Medication Instructions:  Your physician recommends that you continue on your current medications as directed. Please refer to the Current Medication list given to you today.   Labwork: TODAY - cholesterol, basic metabolic panel, liver panel   Testing/Procedures: None Ordered   Follow-Up: Your physician wants you to follow-up in: 6 months with Dr. Nahser.  You will receive a reminder letter in the mail two months in advance. If you don't receive a letter, please call our office to schedule the follow-up appointment.   If you need a refill on your cardiac medications before your next appointment, please call your pharmacy.   Thank you for choosing [facility]! Maleiya Pergola, RN [phone]    

## 2016-10-20 ENCOUNTER — Telehealth: Payer: Self-pay | Admitting: Cardiovascular Disease

## 2016-10-20 NOTE — Telephone Encounter (Signed)
New message ° ° ° ° °Returning a call to the nurse °

## 2016-10-20 NOTE — Telephone Encounter (Signed)
Reviewed lab results with patient's wife, [name], who verbalized understanding. She states doctor and pharmacist at [place] talked to him about Repatha in the past and it was very expensive and then he was told he was too old. Patient is currently taking Lescol 20  mg every other day currently. I asked if he thought he could take it daily. Wife states she will put me on speaker phone to discuss with her and husband. Patient was seen earlier this year in lipid clinic and decided not to enter the clinical trial. Patient and wife asked what Dr. Acie Fredrickson would advise him to do and I suggested that they come back in to discuss with pharmacist. Patient and wife verbalized understanding and agreement with plan and appointment scheduled for 9/11. Wife thanked me for the call.

## 2016-11-04 ENCOUNTER — Ambulatory Visit (INDEPENDENT_AMBULATORY_CARE_PROVIDER_SITE_OTHER): Payer: Medicare Other | Admitting: Pharmacist

## 2016-11-04 DIAGNOSIS — E782 Mixed hyperlipidemia: Secondary | ICD-10-CM

## 2016-11-04 NOTE — Progress Notes (Signed)
Patient ID: Johnny [name].                 DOB: 01-05-1933                    MRN: [id number]     HPI: Johnny [name]. is a 83 y.o. male patient referred to lipid clinic by Dr. Acie [name]. PMH is significant for CAD- s/p CABG, HTN, HLD, DM.  Pt does not tolerate statins d/t muscle pain in the legs, or Zetia d/t GI upset. Dr. Acie [name] states Repatha should be considered however, wife Johnny [name]), states in the past Repatha has been very expensive ($400-$500/month) and he was told he was too old for [facility] therapy by the [place]. Pt has been seen earlier this year in lipid clinic and decided not to enter any lipid clinical trials. Pt presents today for further lipid management  Patient is presenting today to discuss lipid management. He presents in good spirits with his wife and states he has not had any muscle aches while on fluvastatin every other day. He has previously tried multiple statins including rosuvastatin, atorvastatin, pravastatin, simvastatin, and lovastatin (do not have records of doses since pt was treated at the [place]). He experienced myalgias on each of these statins that resolved upon statin discontinuation. He has also tried Zetia but experienced GI upset and diarrhea.  He is willing to try Repatha as long as it is affordable. Of note, Repatha does contain latex and pt has an allergy listed for latex. He states it was with latex gloves and uses band-aids without a reaction. Discussed that Praluent does not contain latex, however pt assistance form requires pt to have spent $300 out of pocket on medications this year, and pt does not think he has done so. Also offered once monthly Repatha infusion which is latex free - pt and his wife state his allergy was not very bad and that he prefers to continue with Repatha pre-filled pens.  Current Medications: Fluvastatin 20mg  every other day Intolerances: rosuvastatin, atorvastatin, lovastatin, pravastatin, simvastatin- muscle aches. Zetia-  diarrhea. Risk Factors: CAD, NSTEMI in 2017, family history, age LDL goal: 70mg /dL  Diet: Drinking water-- perhaps not as much as he should. Ice cream and oreos twice daily, snacks on baby ruths and drinks two to three 8 oz cokes/day. Wife cooks lunch and dinner consisting of: chicken, salmon, pork, shrimp, broccoli, spinach, and black eyed beans. Pt states he has quit smoking, so he can quit anything. Goal to cut back on ice cream to once daily and decrease amount of sodas.   Exercise: Not inclined to get out and do anything.   Family History: The patient's family history includes Heart attack in his father; Stroke in his mother.   Social History: The patient reports that he quit smoking about 51 years ago. His smoking use included Cigarettes. He has never used smokeless tobacco. He reports that he does not drink alcohol or use drugs.   Labs: 10/16/16: TC: 207mg /dL, TGs: 219 mg/dL, HDL: 34mg /dL, LDL: 129mg /dL (pt had started fluvastatin 3 weeks prior)  Past Medical History:  Diagnosis Date  . CORONARY ARTERY DISEASE Nov 2007   CABG x3   . DIABETES MELLITUS, TYPE II   . Hip fracture, right ([place])   . History of kidney stones   . HYPERLIPIDEMIA    Not able to take statins.  [name] HYPERTENSION   . Kidney stone 2015    Current Outpatient Prescriptions on File Prior to  Visit  Medication Sig Dispense Refill  . aspirin 81 MG tablet Take 81 mg by mouth every evening.     . carvedilol (COREG) 6.25 MG tablet Take 3.125-6.25 mg by mouth 2 (two) times daily with a meal. Take 6.25 in the morning and 3.125mg  at night    . Flaxseed, Linseed, POWD Take 1 tablet by mouth daily.     . fluvastatin (LESCOL) 20 MG capsule Take 20 mg by mouth every other day.    . metFORMIN (GLUCOPHAGE-XR) 500 MG 24 hr tablet Take 500 mg by mouth daily with breakfast.    . nitroGLYCERIN (NITROSTAT) 0.4 MG SL tablet Place 1 tablet (0.4 mg total) under the tongue every 5 (five) minutes as needed for chest pain. 25 tablet 6  .  Travoprost, BAK Free, (TRAVATAN) 0.004 % SOLN ophthalmic solution Place 1 drop into both eyes at bedtime.    . valsartan (DIOVAN) 40 MG tablet Take 1 tablet (40 mg total) by mouth daily. 30 tablet 0   No current facility-administered medications on file prior to visit.     Allergies  Allergen Reactions  . Cozaar   . Ace Inhibitors Itching    Ache, itching..  . Crestor [Rosuvastatin Calcium]     Muscle aches  . Lipitor [Atorvastatin Calcium]     Muscle aches  . Lisinopril     REACTION: rash ?  . Lovastatin     Muscle aches  . Pravastatin     Muscle aches  . Zetia [Ezetimibe] Diarrhea  . Zocor [Simvastatin - High Dose]     Muscle aches  . Latex Rash    Assessment/Plan:  1. Hyperlipidemia: LDL is currently uncontrolled at 129mg /dL above goal < 70mg /dl due to history of ASCVD. Pt is intolerant to 5 statins and Zetia, however he is currently tolerating fluvastatin 20mg  every other day. Discussed with pt that PCSK9i is the only option to bring cholesterol to goal. He was previously approved for Repatha, however the copay was cost prohibitive. He also deferred enrollment in lipid clinical trials. Discussed expected benefits, side effects, and injection technique for Repatha therapy. Will submit prior authorization. Also had pt fill out [employer] application in case copay is cost prohibitive.  2. Lifestyle - Advised pt to work on limiting intake of regular soda, ice cream, and overall snacking.    Pt seen with Johnny [name], P4 pharmacy student  [place]. Supple, PharmD, CPP, [place] [address], [place], [place] [address] Phone: [phone]; Fax: [phone] 11/04/2016 2:24 PM

## 2016-11-04 NOTE — Patient Instructions (Signed)
It was nice to meet you today  Continue to take your fluvastatin 20mg  every other day  We will submit paperwork to see if we can get you on Repatha for free  You should head from the [place] within 3-4 weeks regarding a decision on Repatha  Call Megan in the lipid clinic with any concerns [phone]

## 2016-11-21 ENCOUNTER — Telehealth: Payer: Self-pay | Admitting: Pharmacist

## 2016-11-21 NOTE — Telephone Encounter (Signed)
Appeals letter for Repatha has been approved. Monthly copay is cost prohibitive at $370 per month. Submitted application to [employer] today to see if pt will qualify for free Repatha.

## 2017-01-22 ENCOUNTER — Encounter: Payer: Self-pay | Admitting: Internal Medicine

## 2017-02-04 ENCOUNTER — Other Ambulatory Visit: Payer: Self-pay | Admitting: Pharmacist

## 2017-02-04 NOTE — Telephone Encounter (Signed)
Pt approved for Repatha through [place] from 02/02/17 through 02/23/18. Pt is aware.

## 2017-02-10 ENCOUNTER — Other Ambulatory Visit: Payer: Self-pay | Admitting: Cardiovascular Disease

## 2017-03-03 ENCOUNTER — Telehealth: Payer: Self-pay | Admitting: Pharmacist

## 2017-03-03 DIAGNOSIS — E782 Mixed hyperlipidemia: Secondary | ICD-10-CM

## 2017-03-03 NOTE — Telephone Encounter (Signed)
Pt's wife called clinic and reports that pt received his first shipment of [place] in the mail today. They will come in tomorrow afternoon for injection training.

## 2017-03-04 NOTE — Telephone Encounter (Addendum)
Pt presents today for Repatha injection training with his wife. Pt self-administered Repatha into the abdomen, however he pulled the pen away too quickly and only got ~1/2 of the injection. His wife will help him with future injections and call with any concerns. F/u lab work has been scheduled after 4 injections.

## 2017-03-04 NOTE — Addendum Note (Signed)
Addended by: Faraaz Wolin E on: 03/04/2017 02:41 PM   Modules accepted: Orders

## 2017-04-21 ENCOUNTER — Other Ambulatory Visit: Payer: Medicare Other

## 2017-04-21 DIAGNOSIS — E782 Mixed hyperlipidemia: Secondary | ICD-10-CM

## 2017-04-21 LAB — HEPATIC FUNCTION PANEL
ALK PHOS: 65 IU/L (ref 39–117)
ALT: 8 IU/L (ref 0–44)
AST: 8 IU/L (ref 0–40)
Albumin: 3.8 g/dL (ref 3.5–4.7)
Bilirubin Total: 0.5 mg/dL (ref 0.0–1.2)
Bilirubin, Direct: 0.15 mg/dL (ref 0.00–0.40)
Total Protein: 5.9 g/dL — ABNORMAL LOW (ref 6.0–8.5)

## 2017-04-21 LAB — LIPID PANEL
CHOLESTEROL TOTAL: 102 mg/dL (ref 100–199)
Chol/HDL Ratio: 2.8 ratio (ref 0.0–5.0)
HDL: 36 mg/dL — AB (ref >39)
LDL CALC: 46 mg/dL (ref 0–99)
Triglycerides: 100 mg/dL (ref 0–149)
VLDL CHOLESTEROL CAL: 20 mg/dL (ref 5–40)

## 2017-06-08 ENCOUNTER — Ambulatory Visit: Payer: Medicare Other | Admitting: Cardiovascular Disease

## 2017-06-08 ENCOUNTER — Encounter: Payer: Self-pay | Admitting: Cardiovascular Disease

## 2017-06-08 VITALS — BP 98/42 | HR 94 | Ht 67.0 in | Wt 151.4 lb

## 2017-06-08 DIAGNOSIS — I2510 Atherosclerotic heart disease of native coronary artery without angina pectoris: Secondary | ICD-10-CM

## 2017-06-08 DIAGNOSIS — I10 Essential (primary) hypertension: Secondary | ICD-10-CM | POA: Diagnosis not present

## 2017-06-08 NOTE — Progress Notes (Signed)
Cardiology Office Note   Date:  06/08/2017   ID:  Johnny Peek., DOB 1932-05-30, MRN [id number]  PCP:  [name], MD  Cardiologist:   [name], MD   Chief Complaint  Patient presents with  . Coronary Artery Disease   1. Coronary artery disease-status post CABG in 2007 2. Diabetes mellitus 3. Hyperlipidemia 4. Hypertension   84 yo gentleman with a hx of CAD - s/p CABG, HTN,   He's had some recent episodes of dizziness. His blood pressure has also been elevated. He felt poorly. He denies any chest pain or shortness breath. He had lots of diaphoresis. He called me last week complaining of not feeling well. It was difficult for him to describe exactly what his symptoms were but he did not describe any chest pain or shortness breath. He did have the sensation of flushing and also had profound diaphoresis. His blood pressure was noted to be elevated. He was seen by [name] here in the office. She increase his carvedilol and he presents today for further evaluation.  He 's feeling somewhat fatigued today. He complains of left arm and left leg numbness. He was walking up until 4-5 weeks ago ( 50 minutes a day 5-6 days a week without problems)  He has restarted his Aspirin and his coreg. He has not had any angina.   August 25, 2012:  Johnny [name] ruptured a tendon in his right leg and is still having problems. No CP   Dec. 9, 2014: Johnny [name] was found to have a superficial DVT. He's been on Lovenox for the past 7 days. He has had a kidney stone. He also has had a ruptured tendon in his right leg earlier this year. He has not as active.  His wife states that he is tired. He has lost 40 lbs this past 6 months. I suspect that these 2 complaints are related. He has chills, no fevers, no sweats. He is eating OK - he quit drinking sodas which may be contributing to this weight loss. His BP is high here but his wife states that his BP is normal and even low at home.   August 02, 2013:  Johnny [name] has continued to have lots of issues - kidney stones, fracture of hip, . No cardiac issues.  He's been going to the [facility]. He's had an echocardiogram which revealed normal left and her systolic function with an EF of 55%. He had mild aortic sclerosis. He has trace mitral regurgitation and mild tricuspid regurgitation. He also has had a pharmacological stress Myoview study at the [place] which was unremarkable.   Dec. 4, 2015:  Johnny [name] is a 84 y.o. man who I seen for CAD and HTN. He also has a history of hyperlipidemia and diabetes mellitus. Everything seems to be going much better. Walking daily on a treadmill . Feels well on the treadmill.  Had a carotid duplex that showed mild irreg.    Jul 13, 2014:  Johnny Mancha. is a 84 y.o. male who presents for follow up of his CAD He is doing well.  No CP Was seen with his wife.  Memory seems to be challenged at times Wants to start going back to the [facility] office.   Nov. 15 ,2016:  Johnny [name] is seen today for follow up of his CAD  Nov. 15, 2016:  Johnny [name] is doing ok.    No CP  BP  Has been OK Is a bit high today  Jul 20, 2015:  Johnny [name] is doing  Well from a cardiac standpoint Has had some podiatry work ,    Aug. 31, 2017: Johnny [name] was admitted for extreme hypertension. He had positive cardiac enzymes consistent with a non-ST segment elevation myocardial infarction. Cardia catheterization revealed mild to moderate irregularities.  He was started on isosorbide but developed lightheadedness and a headache.   Has not been exercising    Dec. 1, 2017 Johnny [name] is seen today  Continues to be very tired.   Is not walking  No angina .    He seems to just not want to go out and exercise   Aug. 23, 2018:  Johnny [name] is seen with his wife,  Johnny [name] today  Still tired.  Not inclined to get out and do anything.   Drinking water - perhaps not as much as he should .   June 08, 2017:  Johnny [name] is seen with his wife,  Johnny [name] today    Doing well from a cardiac standpoint .    No CP .  Gets DOE at times  Is falling on occasion.   Has had elevated calcium levels and needs to have parathyroid surgery .    Past Medical History:  Diagnosis Date  . CORONARY ARTERY DISEASE Nov 2007   CABG x3   . DIABETES MELLITUS, TYPE II   . Hip fracture, right ([place])   . History of kidney stones   . HYPERLIPIDEMIA    Not able to take statins.  [name] HYPERTENSION   . Kidney stone 2015    Past Surgical History:  Procedure Laterality Date  . CARDIAC CATHETERIZATION N/A 10/09/2015   Procedure: Left Heart Cath and Cors/Grafts Angiography;  Surgeon: Jettie Booze, MD;  Location: [facility];  Service: Cardiovascular;  Laterality: N/A;  . CORONARY ANGIOPLASTY WITH STENT PLACEMENT  01/09/2006   Three-vessel CAD  His right coronary artery is extremely tight.  He has moderate to severe disease in the LAD & left circumflex artery.  The LAD is very heavily calcified and the proximal and ostial LAD is not a good target for PTCA.  We will refer him to CVTS for further evaluation  . CORONARY ANGIOPLASTY WITH STENT PLACEMENT  02/14/2000   EF - of around 65-70%./PTCA and stenting of his mid right  coronary artery (4.0 x 18 mm NIR stent to the mid RCA).  He was noted  to have a 50% ostial stenosis at the time of his heart catheterization   . CORONARY ARTERY BYPASS GRAFT  01/17/2006   x3 using a left internal mammary artery graft to left anterior descending coronary artery, saphenous vein graft to obtuse marginal branch, saphenous vein graft to posterior descending branch / Endoscopic vein harvesting from the right leg.  [name] GASTROCNEMIUS RECESSION  03/11/2012   Procedure: GASTROCNEMIUS SLIDE;  Surgeon: Wylene Simmer, MD;  Location: [facility];  Service: Orthopedics;  Laterality: Right;  . HIP FRACTURE SURGERY     right  . TENDON REPAIR  03/11/2012   Procedure: TENDON REPAIR;  Surgeon: Wylene Simmer, MD;  Location: [facility];  Service: Orthopedics;  Laterality: Right;  Right Tibialis Tendon Repair With Gastrocnemius Recession; Possible PLANTARIS AUTOGRAFT   . TONSILLECTOMY    . TONSILLECTOMY    . VASECTOMY       Current Outpatient Medications  Medication Sig Dispense Refill  . aspirin 81 MG tablet Take 81 mg by mouth every evening.     . carvedilol (COREG) 6.25  MG tablet Take 3.125-6.25 mg by mouth 2 (two) times daily with a meal. Take 6.25 in the morning and 3.125mg  at night    . Evolocumab (REPATHA SURECLICK) 425 MG/ML SOAJ Inject 1 pen into the skin every 14 (fourteen) days. 2 pen 11  . finasteride (PROSCAR) 5 MG tablet Take 5 mg by mouth at bedtime.    . Flaxseed, Linseed, POWD Take 1 tablet by mouth daily.     . fluvastatin (LESCOL) 20 MG capsule Take 20 mg by mouth every other day.    . metFORMIN (GLUCOPHAGE-XR) 500 MG 24 hr tablet Take 500 mg by mouth daily with breakfast.    . nitroGLYCERIN (NITROSTAT) 0.4 MG SL tablet DISSOLVE ONE TABLET UNDER THE TONGUE EVERY 5 MINUTES AS NEEDED FOR CHEST PAIN. 25 tablet 5  . Travoprost, BAK Free, (TRAVATAN) 0.004 % SOLN ophthalmic solution Place 1 drop into both eyes at bedtime.    . valsartan (DIOVAN) 40 MG tablet Take 1 tablet (40 mg total) by mouth daily. 30 tablet 0   No current facility-administered medications for this visit.     Allergies:   Cozaar; Ace inhibitors; Crestor [rosuvastatin calcium]; Lipitor [atorvastatin calcium]; Lisinopril; Lovastatin; Pravastatin; Statins; Zetia [ezetimibe]; Zocor [simvastatin - high dose]; and Latex    Social History:  The patient  reports that he quit smoking about 52 years ago. His smoking use included cigarettes. He has never used smokeless tobacco. He reports that he does not drink alcohol or use drugs.   Family History:  The patient's family history includes Heart attack in his father; Stroke in his mother.    ROS:   Noted in current history, otherwise review of systems is negative.  Physical Exam: Blood  pressure (!) 98/42, pulse 94, height 5\' 7"  (1.702 m), weight 151 lb 6.4 oz (68.7 kg), SpO2 94 %.  GEN:  Well nourished, well developed in no acute distress HEENT: Normal NECK: No JVD; No carotid bruits LYMPHATICS: No lymphadenopathy CARDIAC: RRR, no murmurs, rubs, gallops RESPIRATORY:  Clear to auscultation without rales, wheezing or rhonchi  ABDOMEN: Soft, non-tender, non-distended MUSCULOSKELETAL:  No edema; No deformity  SKIN: Warm and dry NEUROLOGIC:  Alert and oriented x 3   ECG "      Recent Labs: 10/16/2016: BUN 16; Creatinine, Ser 0.91; Potassium 4.5; Sodium 135 04/21/2017: ALT 8    Lipid Panel    Component Value Date/Time   CHOL 102 04/21/2017 0849   TRIG 100 04/21/2017 0849   HDL 36 (L) 04/21/2017 0849   CHOLHDL 2.8 04/21/2017 0849   CHOLHDL 7.4 10/07/2015 0228   VLDL 31 10/07/2015 0228   LDLCALC 46 04/21/2017 0849   LDLDIRECT 167.1 08/18/2012 0916      Wt Readings from Last 3 Encounters:  06/08/17 151 lb 6.4 oz (68.7 kg)  10/16/16 150 lb 12.8 oz (68.4 kg)  01/25/16 157 lb (71.2 kg)      Other studies Reviewed: Additional studies/ records that were reviewed today include: . Review of the above records demonstrates:    ASSESSMENT AND PLAN:  1. Coronary artery disease- Johnny [name] is stable Needs to have parathyroid surgery .   I think he is stable .  He is at low - moderate risk for his upcomng parathyroid surgery   Will see him in 6 months     2. Diabetes mellitus -   3. Hyperlipidemia -,    stable  4. Hypertension -   Well controlled  5. Generalized weakness:      Current  medicines are reviewed at length with the patient today.  The patient does not have concerns regarding medicines.  The following changes have been made:  no change  Labs/ tests ordered today include:  No orders of the defined types were placed in this encounter.   Disposition:   FU with me in  6 months    [name], MD  06/08/2017 3:40 PM    [facility] [place], [place], [place]  [address] Phone: [phone]; Fax: [phone]

## 2017-06-08 NOTE — Patient Instructions (Signed)
Medication Instructions:  Your physician recommends that you continue on your current medications as directed. Please refer to the Current Medication list given to you today.   Labwork: None Ordered   Testing/Procedures: None Ordered   Follow-Up: Your physician wants you to follow-up in: 6 months with Dr. Nahser.  You will receive a reminder letter in the mail two months in advance. If you don't receive a letter, please call our office to schedule the follow-up appointment.   If you need a refill on your cardiac medications before your next appointment, please call your pharmacy.   Thank you for choosing [facility]! Casmere Hollenbeck, RN [phone]    

## 2017-07-08 ENCOUNTER — Telehealth: Payer: Self-pay | Admitting: Pharmacist

## 2017-07-08 NOTE — Telephone Encounter (Signed)
Pt's wife called clinic with reports of patient fatigue, chills, and some muscle aches. She reports fatigue started before Repatha shots. He also had his parathyroid gland removed 5/6.  Advised pt he can skip his next Repatha injection and monitor for any symptom improvement. Pt's wife will call clinic with an update in the next few weeks.

## 2017-08-03 NOTE — Telephone Encounter (Signed)
Pt's wife called clinic with an update - reports no improvement in symptoms since skipping last Repatha injection. Advised pt to resume Repatha since this was not likely the cause of patient's symptoms.   Pt's wife also reports he stopped taking his carvedilol and valsartan 1 month ago due to low BP readings. Reports his BP has remained low 90-115/-40-50s off of both medications with occasional dizziness. Reports BP dropped after recent parathyroid surgery. Will forward to Dr Acie Fredrickson for review and advisement.

## 2017-08-03 NOTE — Telephone Encounter (Signed)
Agree with holding Coreg and Valsartan for now since BP is low He may need to see his primary MD

## 2017-08-04 NOTE — Telephone Encounter (Signed)
Left detailed message on patient's home voice mail for patient or wife to call back at their convenience to catch up on patient's symptoms and further testing.

## 2017-08-04 NOTE — Addendum Note (Signed)
Addended by: Emmaline Life on: 08/04/2017 03:24 PM   Modules accepted: Orders

## 2017-08-04 NOTE — Telephone Encounter (Signed)
Spoke with patient and wife who state Dr. Celine Ahr at [facility] did parathyroid surgery on 5/6 and since that time patient's BP has been <233 mmHg systolic. Patient was seen by Dr. Celine Ahr 3 weeks after surgery and BP was averaging 117/56 mmHg at which time Valsartan and Carvedilol were d/c'ed. Light-headed and dizziness occurs occasionally, probably associated with lower BP Current BP is 132/65 mmHg and HR 65 bpm and I advised these numbers are very good Wife asks about finasteride and flomax which patient takes for his prostate. I advised that those medications also lower BP and that patient should contact urologist for advice on these if BP remains low. I advised that patient remain off carvedilol and valsartan until next appointment and scheduled patient for f/u with Dr. Acie Fredrickson on 7/17. I advised them to call back with additional questions or concerns prior to that appointment. Patient's wife was very thankful for my help.

## 2017-09-09 ENCOUNTER — Encounter: Payer: Self-pay | Admitting: Cardiovascular Disease

## 2017-09-09 ENCOUNTER — Ambulatory Visit: Payer: Medicare Other | Admitting: Cardiovascular Disease

## 2017-09-09 VITALS — BP 128/62 | HR 76 | Ht 67.0 in | Wt 151.2 lb

## 2017-09-09 DIAGNOSIS — I10 Essential (primary) hypertension: Secondary | ICD-10-CM | POA: Diagnosis not present

## 2017-09-09 DIAGNOSIS — I25119 Atherosclerotic heart disease of native coronary artery with unspecified angina pectoris: Secondary | ICD-10-CM

## 2017-09-09 DIAGNOSIS — E782 Mixed hyperlipidemia: Secondary | ICD-10-CM

## 2017-09-09 DIAGNOSIS — I2510 Atherosclerotic heart disease of native coronary artery without angina pectoris: Secondary | ICD-10-CM

## 2017-09-09 NOTE — Addendum Note (Signed)
Addended by: Sarina Ill on: 09/09/2017 11:00 AM   Modules accepted: Orders

## 2017-09-09 NOTE — Patient Instructions (Signed)
Medication Instructions:  Your physician recommends that you continue on your current medications as directed. Please refer to the Current Medication list given to you today.   Labwork: Your physician recommends that you return for lab work in: 6 months ( BMET, LFTs, Lipids) Come fasting to the appointment.   Testing/Procedures: None  Follow-Up: Your physician wants you to follow-up in: 6 months with Dr. Acie Fredrickson. You will receive a reminder letter in the mail two months in advance. If you don't receive a letter, please call our office to schedule the follow-up appointment.   If you need a refill on your cardiac medications before your next appointment, please call your pharmacy.

## 2017-09-09 NOTE — Progress Notes (Signed)
Cardiology Office Note   Date:  09/09/2017   ID:  Johnny [name]., DOB 1932/07/26, MRN [id number]  PCP:  [name], MD  Cardiologist:   [name], MD   Chief Complaint  Patient presents with  . Coronary Artery Disease   1. Coronary artery disease-status post CABG in 2007 2. Diabetes mellitus 3. Hyperlipidemia 4. Hypertension   84 yo gentleman with a hx of CAD - s/p CABG, HTN,   He's had some recent episodes of dizziness. His blood pressure has also been elevated. He felt poorly. He denies any chest pain or shortness breath. He had lots of diaphoresis. He called me last week complaining of not feeling well. It was difficult for him to describe exactly what his symptoms were but he did not describe any chest pain or shortness breath. He did have the sensation of flushing and also had profound diaphoresis. His blood pressure was noted to be elevated. He was seen by [name] here in the office. She increase his carvedilol and he presents today for further evaluation.  He 's feeling somewhat fatigued today. He complains of left arm and left leg numbness. He was walking up until 4-5 weeks ago ( 50 minutes a day 5-6 days a week without problems)  He has restarted his Aspirin and his coreg. He has not had any angina.   August 25, 2012:  [name] ruptured a tendon in his right leg and is still having problems. No CP   Dec. 9, 2014: Johnny [name] was found to have a superficial DVT. He's been on Lovenox for the past 7 days. He has had a kidney stone. He also has had a ruptured tendon in his right leg earlier this year. He has not as active.  His wife states that he is tired. He has lost 40 lbs this past 6 months. I suspect that these 2 complaints are related. He has chills, no fevers, no sweats. He is eating OK - he quit drinking sodas which may be contributing to this weight loss. His BP is high here but his wife states that his BP is normal and even low at home.   August 02, 2013:  Johnny [name] has continued to have lots of issues - kidney stones, fracture of hip, . No cardiac issues.  He's been going to the [facility]. He's had an echocardiogram which revealed normal left and her systolic function with an EF of 55%. He had mild aortic sclerosis. He has trace mitral regurgitation and mild tricuspid regurgitation. He also has had a pharmacological stress Myoview study at the [place] which was unremarkable.   Dec. 4, 2015:  Johnny [name] is a 84 y.o. man who I seen for CAD and HTN. He also has a history of hyperlipidemia and diabetes mellitus. Everything seems to be going much better. Walking daily on a treadmill . Feels well on the treadmill.  Had a carotid duplex that showed mild irreg.    Jul 13, 2014:  Johnny [name]. is a 84 y.o. male who presents for follow up of his CAD He is doing well.  No CP Was seen with his wife.  Memory seems to be challenged at times Wants to start going back to the [facility] office.   Nov. 15 ,2016:  Johnny [name] is seen today for follow up of his CAD  Nov. 15, 2016:  Johnny [name] is doing ok.    No CP  BP  Has been OK Is a bit high today  Jul 20, 2015:  Johnny [name] is doing  Well from a cardiac standpoint Has had some podiatry work ,    Aug. 31, 2017: Johnny [name] was admitted for extreme hypertension. He had positive cardiac enzymes consistent with a non-ST segment elevation myocardial infarction. Cardia catheterization revealed mild to moderate irregularities.  He was started on isosorbide but developed lightheadedness and a headache.   Has not been exercising    Dec. 1, 2017 Johnny [name] is seen today  Continues to be very tired.   Is not walking  No angina .    He seems to just not want to go out and exercise   Aug. 23, 2018:  Johnny [name] is seen with his wife,  [name] today  Still tired.  Not inclined to get out and do anything.   Drinking water - perhaps not as much as he should .   June 08, 2017:  Johnny [name] is seen with his wife,  [name] today    Doing well from a cardiac standpoint .    No CP .  Gets DOE at times  Is falling on occasion.   Has had elevated calcium levels and needs to have parathyroid surgery .   September 09, 2017: He had parathyroid surgery since I last saw him  He feels the same .  BP dropped after his surgery .   Then gradually increased  Has been started on Valsartan  ( wife does not know the dose  but it is 1/2 tablet a day )   Past Medical History:  Diagnosis Date  . CORONARY ARTERY DISEASE Nov 2007   CABG x3   . DIABETES MELLITUS, TYPE II   . Hip fracture, right ([place])   . History of kidney stones   . HYPERLIPIDEMIA    Not able to take statins.  Johnny [name] HYPERTENSION   . Kidney stone 2015    Past Surgical History:  Procedure Laterality Date  . CARDIAC CATHETERIZATION N/A 10/09/2015   Procedure: Left Heart Cath and Cors/Grafts Angiography;  Surgeon: Jettie Booze, MD;  Location: [facility];  Service: Cardiovascular;  Laterality: N/A;  . CORONARY ANGIOPLASTY WITH STENT PLACEMENT  01/09/2006   Three-vessel CAD  His right coronary artery is extremely tight.  He has moderate to severe disease in the LAD & left circumflex artery.  The LAD is very heavily calcified and the proximal and ostial LAD is not a good target for PTCA.  We will refer him to CVTS for further evaluation  . CORONARY ANGIOPLASTY WITH STENT PLACEMENT  02/14/2000   EF - of around 65-70%./PTCA and stenting of his mid right  coronary artery (4.0 x 18 mm NIR stent to the mid RCA).  He was noted  to have a 50% ostial stenosis at the time of his heart catheterization   . CORONARY ARTERY BYPASS GRAFT  01/17/2006   x3 using a left internal mammary artery graft to left anterior descending coronary artery, saphenous vein graft to obtuse marginal branch, saphenous vein graft to posterior descending branch / Endoscopic vein harvesting from the right leg.  Johnny [name] GASTROCNEMIUS RECESSION  03/11/2012   Procedure: GASTROCNEMIUS SLIDE;  Surgeon: Wylene Simmer,  MD;  Location: [facility];  Service: Orthopedics;  Laterality: Right;  . HIP FRACTURE SURGERY     right  . TENDON REPAIR  03/11/2012   Procedure: TENDON REPAIR;  Surgeon: Wylene Simmer, MD;  Location: [facility];  Service: Orthopedics;  Laterality: Right;  Right Tibialis Tendon Repair With  Gastrocnemius Recession; Possible PLANTARIS AUTOGRAFT   . TONSILLECTOMY    . TONSILLECTOMY    . VASECTOMY       Current Outpatient Medications  Medication Sig Dispense Refill  . aspirin 81 MG tablet Take 81 mg by mouth every evening.     . Evolocumab (REPATHA SURECLICK) 403 MG/ML SOAJ Inject 1 pen into the skin every 14 (fourteen) days. 2 pen 11  . finasteride (PROSCAR) 5 MG tablet Take 5 mg by mouth at bedtime.    . Flaxseed, Linseed, POWD Take 1 tablet by mouth daily.     . metFORMIN (GLUCOPHAGE-XR) 500 MG 24 hr tablet Take 500 mg by mouth daily with breakfast.    . nitroGLYCERIN (NITROSTAT) 0.4 MG SL tablet DISSOLVE ONE TABLET UNDER THE TONGUE EVERY 5 MINUTES AS NEEDED FOR CHEST PAIN. 25 tablet 5  . tamsulosin (FLOMAX) 0.4 MG CAPS capsule Take 0.4 mg by mouth as directed.    . Travoprost, BAK Free, (TRAVATAN) 0.004 % SOLN ophthalmic solution Place 1 drop into both eyes at bedtime.     No current facility-administered medications for this visit.     Allergies:   Cozaar; Ace inhibitors; Crestor [rosuvastatin calcium]; Lipitor [atorvastatin calcium]; Lisinopril; Lovastatin; Pravastatin; Statins; Zetia [ezetimibe]; Zocor [simvastatin - high dose]; and Latex    Social History:  The patient  reports that he quit smoking about 52 years ago. His smoking use included cigarettes. He has never used smokeless tobacco. He reports that he does not drink alcohol or use drugs.   Family History:  The patient's family history includes Heart attack in his father; Stroke in his mother.    ROS:   Noted in current history, otherwise review of systems is negative.  Physical Exam: Blood  pressure 128/62, pulse 76, height 5\' 7"  (1.702 m), weight 151 lb 4 oz (68.6 kg), SpO2 93 %.  GEN:  Well nourished, well developed in no acute distress HEENT: Normal NECK: No JVD; No carotid bruits LYMPHATICS: No lymphadenopathy CARDIAC: RR, no murmurs, rubs, gallops RESPIRATORY:  Clear to auscultation without rales, wheezing or rhonchi  ABDOMEN: Soft, non-tender, non-distended MUSCULOSKELETAL:  No edema; No deformity  SKIN: Warm and dry NEUROLOGIC:  Alert and oriented x 3   ECG:  September 09, 2017:   NSR at 76 with occasional PVCs   Recent Labs: 10/16/2016: BUN 16; Creatinine, Ser 0.91; Potassium 4.5; Sodium 135 04/21/2017: ALT 8    Lipid Panel    Component Value Date/Time   CHOL 102 04/21/2017 0849   TRIG 100 04/21/2017 0849   HDL 36 (L) 04/21/2017 0849   CHOLHDL 2.8 04/21/2017 0849   CHOLHDL 7.4 10/07/2015 0228   VLDL 31 10/07/2015 0228   LDLCALC 46 04/21/2017 0849   LDLDIRECT 167.1 08/18/2012 0916      Wt Readings from Last 3 Encounters:  09/09/17 151 lb 4 oz (68.6 kg)  06/08/17 151 lb 6.4 oz (68.7 kg)  10/16/16 150 lb 12.8 oz (68.4 kg)      Other studies Reviewed: Additional studies/ records that were reviewed today include: . Review of the above records demonstrates:    ASSESSMENT AND PLAN:  1. Coronary artery disease-   no angina.   Over all very stable   2. Diabetes mellitus -   3. Hyperlipidemia -,   Check lipids in 6 months   4. Hypertension -   BP is well controlled  - wife will call with valsartan dose later today   5. Generalized weakness:    I've recommened  PT   Current medicines are reviewed at length with the patient today.  The patient does not have concerns regarding medicines.  The following changes have been made:  no change  Labs/ tests ordered today include:  No orders of the defined types were placed in this encounter.   Disposition:   FU with me in  6 months    [name], MD  09/09/2017 10:41 AM    [address], [place],   [address] Phone: [phone]; Fax: [phone]

## 2017-09-10 ENCOUNTER — Telehealth: Payer: Self-pay | Admitting: Cardiovascular Disease

## 2017-09-10 NOTE — Telephone Encounter (Signed)
NEW MESSAGE    Pt c/o medication issue:  1. Name of Medication: Valsartan  2. How are you currently taking this medication (dosage and times per day)? 20mg   3. Are you having a reaction (difficulty breathing--STAT)? NO  4. What is your medication issue? Patient's spouse calling to report medication/ dosage. Requesting call from nurse

## 2017-09-10 NOTE — Telephone Encounter (Signed)
Left message for patient or wife to call back.

## 2017-09-11 NOTE — Telephone Encounter (Signed)
New message: ° ° ° ° ° °Pt's wife is returning a call from yesterday °

## 2017-09-11 NOTE — Telephone Encounter (Signed)
Spoke with patient's wife who reported patient's dose of Valsartan 20 mg once daily prescribed by PCP at the [place]. I updated patient's medication record and his wife thanked me for the call.

## 2017-12-15 ENCOUNTER — Telehealth: Payer: Self-pay | Admitting: Cardiovascular Disease

## 2017-12-15 NOTE — Telephone Encounter (Signed)
I reviewed patient's chart and called Mrs. Sitzer back. Patient is not due for appointment until Jan. Cancelled appt for this week and r/s for Jan. She is aware he is to have fasting labs drawn prior to appointment but did not want to schedule at this time.  She will call back re: labs.

## 2017-12-15 NOTE — Telephone Encounter (Signed)
New Message:     When does need his next appt according to Dr Acie Fredrickson? He is down for October, but recall letter says January.

## 2017-12-17 ENCOUNTER — Ambulatory Visit: Payer: Medicare Other | Admitting: Cardiovascular Disease

## 2018-01-05 ENCOUNTER — Telehealth: Payer: Self-pay | Admitting: Pharmacist

## 2018-01-05 DIAGNOSIS — E782 Mixed hyperlipidemia: Secondary | ICD-10-CM

## 2018-01-05 NOTE — Telephone Encounter (Addendum)
Pt's wife called with issues getting Repatha from [employer]. Called them and rx expired, new rx faxed. Will mail out SNF application for 7169 and pt will mail back to office. Lipid labs from 10/2017 in [place].

## 2018-01-26 ENCOUNTER — Other Ambulatory Visit: Payer: Medicare Other

## 2018-03-09 ENCOUNTER — Telehealth: Payer: Self-pay | Admitting: Pharmacist

## 2018-03-09 ENCOUNTER — Ambulatory Visit: Payer: Medicare Other | Admitting: Cardiovascular Disease

## 2018-03-09 MED ORDER — EVOLOCUMAB 140 MG/ML ~~LOC~~ SOAJ: 1.0000 "pen " | SUBCUTANEOUS | 11 refills | Status: DC

## 2018-03-09 NOTE — Telephone Encounter (Signed)
Pt called clinic - pt was denied from [employer] this year, most applications have been denied this year. Will send rx to pharmacy to see if copay is affordable, looks as though it will be ~$100 per formulary online. Pt is not sure if this is affordable but will pick up rx if it is. He is previously intolerant to rosuvastatin, atorvastatin, lovastatin, pravastatin, simvastatin- muscle aches, diarrhea with Zetia. Will also reach out to pt when bempedoic acid is approved to see if this is more affordable.

## 2018-04-20 ENCOUNTER — Ambulatory Visit: Payer: Medicare Other | Admitting: Cardiovascular Disease

## 2018-06-14 ENCOUNTER — Telehealth: Payer: Self-pay | Admitting: Pharmacist

## 2018-06-14 NOTE — Telephone Encounter (Signed)
LMOM for pt to discuss lipids. Repatha copay has decreased from $100 to $47 per month with patient's formulary. He would still have to meet $95 deductible first (1st month fill is $142). Nexletol would also be tier 3 and have the same pricing. Pt can try calling [employer] [facility] to see if they would help cover his deductible so that he could resume Repatha afterwards. Will await patient's return call.

## 2018-06-23 NOTE — Telephone Encounter (Signed)
Spoke with patient's wife and provided her with [employer]. She will call them to see if they will help with her husband's Repatha copay and will let us know what she finds out.

## 2018-09-06 ENCOUNTER — Telehealth: Payer: Self-pay | Admitting: Pharmacist

## 2018-09-06 MED ORDER — REPATHA SURECLICK 140 MG/ML ~~LOC~~ SOAJ: 1.0000 "pen " | SUBCUTANEOUS | 11 refills | Status: DC

## 2018-09-06 NOTE — Telephone Encounter (Signed)
Called pt to follow up with lipids. Applied for [employer] for [place] and got pt approved for $2500 grant through 08/06/19 so that pt is able to resume Repatha injections for free. Following grant info provided to pharmacy. Pt is aware, no further concerns.  ID [id number] BIN [id number] PCN [id number] GRP [id number]

## 2019-01-13 ENCOUNTER — Telehealth: Payer: Self-pay

## 2019-01-13 DIAGNOSIS — E782 Mixed hyperlipidemia: Secondary | ICD-10-CM

## 2019-01-13 NOTE — Telephone Encounter (Signed)
-----   Message from Leeroy Bock, [facility] sent at 01/13/2019 12:40 PM EST ----- Thanks! Would you be able to call his wife to let them know too?  ----- Message ----- From: Allean Found, CMA Sent: 01/13/2019  11:23 AM EST To: Leeroy Bock, [place]  I will mail them a letter with lab orders ----- Message ----- From: Leeroy Bock, [place]: 01/13/2019  11:16 AM EST To: Allean Found, CMA  I followed up with his PCP - they don't have a lipid panel on file so he'll need one checked. He doesn't live nearby if you're able to let his wife know and add orders for him to have them drawn at a [facility] near his home (or can mail out orders if they don't have a [facility] nearby).  Thank you! Jinny Blossom

## 2019-01-13 NOTE — Telephone Encounter (Signed)
Called pt to let them know that I will be mailing lab orders for lipid panel. Pt voiced understanding

## 2019-02-03 LAB — LIPID PANEL
Chol/HDL Ratio: 3.3 ratio (ref 0.0–5.0)
Cholesterol, Total: 120 mg/dL (ref 100–199)
HDL: 36 mg/dL — ABNORMAL LOW (ref >39)
LDL Chol Calc (NIH): 55 mg/dL (ref 0–99)
Triglycerides: 174 mg/dL — ABNORMAL HIGH (ref 0–149)
VLDL Cholesterol Cal: 29 mg/dL (ref 5–40)

## 2019-03-07 ENCOUNTER — Telehealth: Payer: Self-pay

## 2019-03-07 NOTE — Telephone Encounter (Signed)
I have not worked on any prior authorizations for pt, he may be thinking of his [employer] which is approved through July.

## 2019-03-07 NOTE — Telephone Encounter (Signed)
Called and spoke w/pt and they stated they haven't changed insurances but that they spoke with megan supple and she told them that they were good through July so I will route to pharmd megan

## 2019-03-21 ENCOUNTER — Telehealth: Payer: Self-pay | Admitting: Pharmacist

## 2019-03-21 NOTE — Telephone Encounter (Signed)
Wife called stating his prescription for Repatha has expired. Looks like PA is still good and Rx should still be atctive. [employer] still active. Called pharmacy. They were processing an old coupon. [facility]eran rx with [employer] info. Processed for $0. Wife made aware. Wife also states they are coming back to [facility] to see Dr. Acie Fredrickson soon.

## 2019-06-15 ENCOUNTER — Encounter: Payer: Self-pay | Admitting: Cardiovascular Disease

## 2019-06-15 NOTE — Progress Notes (Signed)
Cardiology Office Note   Date:  06/16/2019   ID:  Johnny [name]., DOB 30-Sep-1932, MRN [id number]  PCP:  [name], MD  Cardiologist:   [name], MD   Chief Complaint  Patient presents with  . Coronary Artery Disease   1. Coronary artery disease-status post CABG in 2007 2. Diabetes mellitus 3. Hyperlipidemia 4. Hypertension   86 yo gentleman with a hx of CAD - s/p CABG, HTN,   He's had some recent episodes of dizziness. His blood pressure has also been elevated. He felt poorly. He denies any chest pain or shortness breath. He had lots of diaphoresis. He called me last week complaining of not feeling well. It was difficult for him to describe exactly what his symptoms were but he did not describe any chest pain or shortness breath. He did have the sensation of flushing and also had profound diaphoresis. His blood pressure was noted to be elevated. He was seen by [name] here in the office. She increase his carvedilol and he presents today for further evaluation.  He 's feeling somewhat fatigued today. He complains of left arm and left leg numbness. He was walking up until 4-5 weeks ago ( 50 minutes a day 5-6 days a week without problems)  He has restarted his Aspirin and his coreg. He has not had any angina.   August 25, 2012:  [name] ruptured a tendon in his right leg and is still having problems. No CP   Dec. 9, 2014: Johnny [name] was found to have a superficial DVT. He's been on Lovenox for the past 7 days. He has had a kidney stone. He also has had a ruptured tendon in his right leg earlier this year. He has not as active.  His wife states that he is tired. He has lost 40 lbs this past 6 months. I suspect that these 2 complaints are related. He has chills, no fevers, no sweats. He is eating OK - he quit drinking sodas which may be contributing to this weight loss. His BP is high here but his wife states that his BP is normal and even low at home.   August 02, 2013:  Johnny [name] has continued to have lots of issues - kidney stones, fracture of hip, . No cardiac issues.  He's been going to the [facility]. He's had an echocardiogram which revealed normal left and her systolic function with an EF of 55%. He had mild aortic sclerosis. He has trace mitral regurgitation and mild tricuspid regurgitation. He also has had a pharmacological stress Myoview study at the [place] which was unremarkable.   Dec. 4, 2015:  Johnny [name] is a 86 y.o. man who I seen for CAD and HTN. He also has a history of hyperlipidemia and diabetes mellitus. Everything seems to be going much better. Walking daily on a treadmill . Feels well on the treadmill.  Had a carotid duplex that showed mild irreg.    Jul 13, 2014:  Johnny [name]. is a 86 y.o. male who presents for follow up of his CAD He is doing well.  No CP Was seen with his wife.  Memory seems to be challenged at times Wants to start going back to the [facility] office.   Nov. 15 ,2016:  Johnny [name] is seen today for follow up of his CAD  Nov. 15, 2016:  Johnny [name] is doing ok.    No CP  BP  Has been OK Is a bit high today  Jul 20, 2015:  Johnny [name] is doing  Well from a cardiac standpoint Has had some podiatry work ,    Aug. 31, 2017: Johnny [name] was admitted for extreme hypertension. He had positive cardiac enzymes consistent with a non-ST segment elevation myocardial infarction. Cardia catheterization revealed mild to moderate irregularities.  He was started on isosorbide but developed lightheadedness and a headache.   Has not been exercising    Dec. 1, 2017 Johnny [name] is seen today  Continues to be very tired.   Is not walking  No angina .    He seems to just not want to go out and exercise   Aug. 23, 2018:  Johnny [name] is seen with his wife,  Johnny [name] today  Still tired.  Not inclined to get out and do anything.   Drinking water - perhaps not as much as he should .   June 08, 2017:  Johnny [name] is seen with his wife,  Johnny [name] today   Doing well from a cardiac standpoint .    No CP .  Gets DOE at times  Is falling on occasion.   Has had elevated calcium levels and needs to have parathyroid surgery .   September 09, 2017: He had parathyroid surgery since I last saw him  He feels the same .  BP dropped after his surgery .   Then gradually increased  Has been started on Valsartan  ( wife does not know the dose  but it is 1/2 tablet a day)   June 16, 2019 Johnny [name] is seen today for follow-up of his coronary artery disease and hypertension. They had moved to [facility] for last year.   Complains of feeling poorly when he wakes up .  Feels very fatigued and hurts  Still drinks coca cola every day .   No CP ,  Breathing is ok . Has some DOE . Does not use his CPAP .     Past Medical History:  Diagnosis Date  . CORONARY ARTERY DISEASE Nov 2007   CABG x3   . DIABETES MELLITUS, TYPE II   . Hip fracture, right ([place])   . History of kidney stones   . HYPERLIPIDEMIA    Not able to take statins.  [name] HYPERTENSION   . Kidney stone 2015    Past Surgical History:  Procedure Laterality Date  . CARDIAC CATHETERIZATION N/A 10/09/2015   Procedure: Left Heart Cath and Cors/Grafts Angiography;  Surgeon: Jettie Booze, MD;  Location: [place] [facility];  Service: Cardiovascular;  Laterality: N/A;  . CORONARY ANGIOPLASTY WITH STENT PLACEMENT  01/09/2006   Three-vessel CAD  His right coronary artery is extremely tight.  He has moderate to severe disease in the LAD & left circumflex artery.  The LAD is very heavily calcified and the proximal and ostial LAD is not a good target for PTCA.  We will refer him to CVTS for further evaluation  . CORONARY ANGIOPLASTY WITH STENT PLACEMENT  02/14/2000   EF - of around 65-70%./PTCA and stenting of his mid right  coronary artery (4.0 x 18 mm NIR stent to the mid RCA).  He was noted  to have a 50% ostial stenosis at the time of his heart catheterization   . CORONARY ARTERY BYPASS GRAFT  01/17/2006    x3 using a left internal mammary artery graft to left anterior descending coronary artery, saphenous vein graft to obtuse marginal branch, saphenous vein graft to posterior descending branch / Endoscopic vein harvesting from the right leg.  [name]  GASTROCNEMIUS RECESSION  03/11/2012   Procedure: GASTROCNEMIUS SLIDE;  Surgeon: Wylene Simmer, MD;  Location: [place];  Service: Orthopedics;  Laterality: Right;  . HIP FRACTURE SURGERY     right  . TENDON REPAIR  03/11/2012   Procedure: TENDON REPAIR;  Surgeon: Wylene Simmer, MD;  Location: [place];  Service: Orthopedics;  Laterality: Right;  Right Tibialis Tendon Repair With Gastrocnemius Recession; Possible PLANTARIS AUTOGRAFT   . TONSILLECTOMY    . TONSILLECTOMY    . VASECTOMY       Current Outpatient Medications  Medication Sig Dispense Refill  . aspirin 81 MG tablet Take 81 mg by mouth every evening.     . Cholecalciferol 25 MCG (1000 UT) capsule Take 1 capsule by mouth daily.    . Evolocumab (REPATHA SURECLICK) [id number] MG/ML SOAJ Inject 1 pen into the skin every 14 (fourteen) days. 2 pen 11  . finasteride (PROSCAR) 5 MG tablet Take 5 mg by mouth at bedtime.    . Flaxseed, Linseed, POWD Take 1 tablet by mouth daily.     . nitroGLYCERIN (NITROSTAT) 0.4 MG SL tablet DISSOLVE ONE TABLET UNDER THE TONGUE EVERY 5 MINUTES AS NEEDED FOR CHEST PAIN. 25 tablet 5  . sertraline (ZOLOFT) 25 MG tablet Take 25 mg by mouth daily.    . tamsulosin (FLOMAX) 0.4 MG CAPS capsule Take 1 capsule by mouth 2 (two) times daily.    . Travoprost, BAK Free, (TRAVATAN) 0.004 % SOLN ophthalmic solution Place 1 drop into both eyes at bedtime.     No current facility-administered medications for this visit.    Allergies:   Cozaar, Ace inhibitors, Crestor [rosuvastatin calcium], Lipitor [atorvastatin calcium], Lisinopril, Lovastatin, Pravastatin, Statins, Zetia [ezetimibe], Zocor [simvastatin - high dose], and Latex    Social History:  The  patient  reports that he quit smoking about 54 years ago. His smoking use included cigarettes. He has never used smokeless tobacco. He reports that he does not drink alcohol or use drugs.   Family History:  The patient's family history includes Heart attack in his father; Stroke in his mother.    ROS:   Noted in current history, otherwise review of systems is negative.   Physical Exam: Blood pressure (!) 142/62, pulse 72, height 5\' 7"  (1.702 m), weight 154 lb 8 oz (70.1 kg), SpO2 98 %.  GEN:  Elderly male,  NAD  HEENT: Normal NECK: No JVD; No carotid bruits LYMPHATICS: No lymphadenopathy CARDIAC: RRR , no murmurs, rubs, gallops RESPIRATORY:  Clear to auscultation without rales, wheezing or rhonchi  ABDOMEN: Soft, non-tender, non-distended MUSCULOSKELETAL:  No edema; No deformity  SKIN: Warm and dry NEUROLOGIC:  Alert and oriented x 3   ECG:    June 16, 2019:  NSR at 71.   TWI V2-V 6.     Recent Labs: No results found for requested labs within last 8760 hours.    Lipid Panel    Component Value Date/Time   CHOL 120 02/02/2019 1051   TRIG 174 (H) 02/02/2019 1051   HDL 36 (L) 02/02/2019 1051   CHOLHDL 3.3 02/02/2019 1051   CHOLHDL 7.4 10/07/2015 0228   VLDL 31 10/07/2015 0228   LDLCALC 55 02/02/2019 1051   LDLDIRECT 167.1 08/18/2012 0916      Wt Readings from Last 3 Encounters:  06/16/19 154 lb 8 oz (70.1 kg)  09/09/17 151 lb 4 oz (68.6 kg)  06/08/17 151 lb 6.4 oz (68.7 kg)      Other studies  Reviewed: Additional studies/ records that were reviewed today include: . Review of the above records demonstrates:    ASSESSMENT AND PLAN:  1. Coronary artery disease-  No recent angina   2. Diabetes mellitus -   3. Hyperlipidemia -,   labs were checked by his primary medical doctor today.  We will get this sent over.  4. Hypertension -blood pressures fairly well controlled.   5. Generalized weakness: Complains of being severely fatigued weak and sore especially  when he wakes up at night.  He is not been using his CPAP.  I suspect a lot of this might be poorly controlled or under control sleep apnea.  Of advised him to start using his CPAP and I think it will make him feel better.    Current medicines are reviewed at length with the patient today.  The patient does not have concerns regarding medicines.  The following changes have been made:  no change  Labs/ tests ordered today include:  No orders of the defined types were placed in this encounter.   Disposition:     [name], MD  06/16/2019 2:55 PM    [address], [place], [place]  [address] Phone: [phone]; Fax: [phone]

## 2019-06-16 ENCOUNTER — Encounter: Payer: Self-pay | Admitting: Cardiovascular Disease

## 2019-06-16 ENCOUNTER — Other Ambulatory Visit: Payer: Self-pay

## 2019-06-16 ENCOUNTER — Ambulatory Visit: Payer: Medicare Other | Admitting: Cardiovascular Disease

## 2019-06-16 VITALS — BP 142/62 | HR 72 | Ht 67.0 in | Wt 154.5 lb

## 2019-06-16 DIAGNOSIS — E782 Mixed hyperlipidemia: Secondary | ICD-10-CM

## 2019-06-16 DIAGNOSIS — I2510 Atherosclerotic heart disease of native coronary artery without angina pectoris: Secondary | ICD-10-CM

## 2019-06-16 DIAGNOSIS — I10 Essential (primary) hypertension: Secondary | ICD-10-CM | POA: Diagnosis not present

## 2019-06-16 DIAGNOSIS — R5383 Other fatigue: Secondary | ICD-10-CM | POA: Diagnosis not present

## 2019-06-16 MED ORDER — NITROGLYCERIN 0.4 MG SL SUBL: SUBLINGUAL_TABLET | SUBLINGUAL | 5 refills | Status: AC

## 2019-06-16 NOTE — Patient Instructions (Signed)
Medication Instructions:  Your physician recommends that you continue on your current medications as directed. Please refer to the Current Medication list given to you today.  *If you need a refill on your cardiac medications before your next appointment, please call your pharmacy*   Lab Work: None Ordered If you have labs (blood work) drawn today and your tests are completely normal, you will receive your results only by: . MyChart Message (if you have MyChart) OR . A paper copy in the mail If you have any lab test that is abnormal or we need to change your treatment, we will call you to review the results.   Testing/Procedures: None Ordered   Follow-Up: At [facility], you and your health needs are our priority.  As part of our continuing mission to provide you with exceptional heart care, we have created designated Provider Care Teams.  These Care Teams include your primary Cardiologist (physician) and Advanced Practice Providers (APPs -  Physician Assistants and Nurse Practitioners) who all work together to provide you with the care you need, when you need it.  We recommend signing up for the patient portal called "MyChart".  Sign up information is provided on this After Visit Summary.  MyChart is used to connect with patients for Virtual Visits (Telemedicine).  Patients are able to view lab/test results, encounter notes, upcoming appointments, etc.  Non-urgent messages can be sent to your provider as well.   To learn more about what you can do with MyChart, go to [email or web address].    Your next appointment:   6 month(s)  The format for your next appointment:   Either In Person or Virtual  Provider:   You may see Philip Nahser, MD or one of the following Advanced Practice Providers on your designated Care Team:    Scott Weaver, PA-C  Vin Bhagat, PA-C  Janine Hammond, NP     

## 2019-07-07 ENCOUNTER — Telehealth: Payer: Self-pay | Admitting: Pharmacist

## 2019-07-07 NOTE — Telephone Encounter (Addendum)
Called [facility] in [place]. They had not run rx via grant. Gave them grant info. Rx now $0 Left message for wife to call back.

## 2019-07-07 NOTE — Telephone Encounter (Signed)
Spoke with wife- informed her rx is good until July, $0 copay and his PA is good until 02/24/20. She was appreciative of the help

## 2019-08-26 ENCOUNTER — Telehealth: Payer: Self-pay | Admitting: Pharmacist

## 2019-08-26 NOTE — Telephone Encounter (Signed)
Patient's wife called.  Copay assistance ran out.  Reapplied for [employer] and patient was approved.  [name] and gave pertinent info.  Pharmacy was able to run prescription for zero dollar copay.  Attempted to call patient back to let her know but no answer. Left message on machine.

## 2019-09-16 ENCOUNTER — Encounter: Payer: Self-pay | Admitting: Gastroenterology

## 2019-09-29 ENCOUNTER — Other Ambulatory Visit: Payer: Self-pay | Admitting: Cardiovascular Disease

## 2019-11-01 ENCOUNTER — Other Ambulatory Visit: Payer: Self-pay | Admitting: Cardiovascular Disease

## 2019-11-16 ENCOUNTER — Ambulatory Visit (INDEPENDENT_AMBULATORY_CARE_PROVIDER_SITE_OTHER): Payer: No Typology Code available for payment source | Admitting: Gastroenterology

## 2019-11-16 ENCOUNTER — Other Ambulatory Visit (INDEPENDENT_AMBULATORY_CARE_PROVIDER_SITE_OTHER): Payer: No Typology Code available for payment source

## 2019-11-16 ENCOUNTER — Encounter: Payer: Self-pay | Admitting: Gastroenterology

## 2019-11-16 VITALS — BP 138/68 | HR 79 | Ht 67.0 in | Wt 154.0 lb

## 2019-11-16 DIAGNOSIS — K529 Noninfective gastroenteritis and colitis, unspecified: Secondary | ICD-10-CM | POA: Diagnosis not present

## 2019-11-16 LAB — CBC WITH DIFFERENTIAL/PLATELET
Basophils Absolute: 0.1 10*3/uL (ref 0.0–0.1)
Basophils Relative: 1.1 % (ref 0.0–3.0)
Eosinophils Absolute: 0.6 10*3/uL (ref 0.0–0.7)
Eosinophils Relative: 5.7 % — ABNORMAL HIGH (ref 0.0–5.0)
HCT: 39.9 % (ref 39.0–52.0)
Hemoglobin: 13.5 g/dL (ref 13.0–17.0)
Lymphocytes Relative: 20.4 % (ref 12.0–46.0)
Lymphs Abs: 2 10*3/uL (ref 0.7–4.0)
MCHC: 33.8 g/dL (ref 30.0–36.0)
MCV: 89.3 fl (ref 78.0–100.0)
Monocytes Absolute: 1.1 10*3/uL — ABNORMAL HIGH (ref 0.1–1.0)
Monocytes Relative: 11.6 % (ref 3.0–12.0)
Neutro Abs: 6 10*3/uL (ref 1.4–7.7)
Neutrophils Relative %: 61.2 % (ref 43.0–77.0)
Platelets: 361 10*3/uL (ref 150.0–400.0)
RBC: 4.47 Mil/uL (ref 4.22–5.81)
RDW: 13.7 % (ref 11.5–15.5)
WBC: 9.8 10*3/uL (ref 4.0–10.5)

## 2019-11-16 LAB — COMPREHENSIVE METABOLIC PANEL
ALT: 13 U/L (ref 0–53)
AST: 16 U/L (ref 0–37)
Albumin: 4.1 g/dL (ref 3.5–5.2)
Alkaline Phosphatase: 85 U/L (ref 39–117)
BUN: 17 mg/dL (ref 6–23)
CO2: 27 mEq/L (ref 19–32)
Calcium: 9.3 mg/dL (ref 8.4–10.5)
Chloride: 99 mEq/L (ref 96–112)
Creatinine, Ser: 1.13 mg/dL (ref 0.40–1.50)
GFR: 61.62 mL/min (ref >60.00)
Glucose, Bld: 177 mg/dL — ABNORMAL HIGH (ref 70–99)
Potassium: 4.3 mEq/L (ref 3.5–5.1)
Sodium: 132 mEq/L — ABNORMAL LOW (ref 135–145)
Total Bilirubin: 0.4 mg/dL (ref 0.2–1.2)
Total Protein: 6.8 g/dL (ref 6.0–8.3)

## 2019-11-16 NOTE — Progress Notes (Addendum)
[facility] Consult Note:  History: Johnny [name]. 11/16/2019  Referring provider: [name], MD  Reason for consult/chief complaint: Diarrhea (Multiple movements daily, soft, dark stools)   Subjective  HPI:  Johnny [name] was referred by the local [place] clinic and is accompanied by his wife Johnny [name] today, and she gives the history because of Johnny [name]'s dementia. He started having diarrhea sometime in the last several months, though there does not appear to be any mention of it in the [place] note. Primary care note from [facility] dated 08/11/2019 was reviewed.  Mention of loss of appetite and dementia. Johnny [name] tells me that stool studies were done at the [place], then she received a call that he had "a bacterial infection" and some antibiotics were prescribed.  We were not sent those records and she does not know which medicine was used or exactly what infection he had.  The problem apparently improved somewhat, but he is still telling her about "soft stool" that is dark 3 or 4 times a day.  She does not always observe it, so it is difficult to characterize.  She has not seen any bright red blood.  He does not describe abdominal pain and she has not heard him complaining of that at home.  He does not seem to have dysphagia odynophagia nausea or vomiting.  She says his appetite is fair and he takes a long time for meals.  Sometimes he then says he is not hungry for meals that she prepares, but shortly afterwards has ice cream and cookies.  Screening colonoscopy with Dr. Sharlett Iles December 2008, only internal hemorrhoids, no polyps.   ROS:  Review of Systems  Constitutional: Positive for fatigue. Negative for appetite change and unexpected weight change.  HENT: Negative for mouth sores and voice change.   Eyes: Negative for pain and redness.  Respiratory: Negative for cough and shortness of breath.   Cardiovascular: Negative for chest pain and palpitations.   Genitourinary: Negative for dysuria and hematuria.  Musculoskeletal: Positive for arthralgias and back pain. Negative for myalgias.  Skin: Negative for pallor and rash.  Neurological: Negative for weakness and headaches.  Hematological: Negative for adenopathy.     Past Medical History: Past Medical History:  Diagnosis Date   Aortic atherosclerosis ([place])    Atherosclerotic heart disease    BPH (benign prostatic hyperplasia)    Calculus of kidney    Calculus of ureter    CORONARY ARTERY DISEASE Nov 2007   CABG x3    DIABETES MELLITUS, TYPE II    Dry skin dermatitis    Glaucoma    Hammer toe    Hip fracture, right ([facility])    History of kidney stones    Hydronephrosis    HYPERLIPIDEMIA    Not able to take statins.   HYPERTENSION    IDA (iron deficiency anemia)    Kidney stone 2015   Peripheral vascular disease, unspecified ([place])    Persistent mood disorder ([facility])    Presence of intraocular lens    Toe deformity    Vitamin B 12 deficiency    Vitamin D deficiency    Last cardiology office note from 06/16/2019 was reviewed.  Describes patient's cardiac history, CAD with prior CABG and hypertension with dyslipidemia.  And ongoing complaints of fatigue without angina.  Reportedly noncompliant with CPAP.  Past Surgical History: Past Surgical History:  Procedure Laterality Date   CARDIAC CATHETERIZATION N/A 10/09/2015   Procedure: Left Heart Cath and Cors/Grafts Angiography;  Surgeon: Jettie Booze, MD;  Location: [place] [facility];  Service: Cardiovascular;  Laterality: N/A;   CORONARY ANGIOPLASTY WITH STENT PLACEMENT  01/09/2006   Three-vessel CAD  His right coronary artery is extremely tight.  He has moderate to severe disease in the LAD & left circumflex artery.  The LAD is very heavily calcified and the proximal and ostial LAD is not a good target for PTCA.  We will refer him to CVTS for further evaluation   CORONARY ANGIOPLASTY WITH STENT  PLACEMENT  02/14/2000   EF - of around 65-70%./PTCA and stenting of his mid right  coronary artery (4.0 x 18 mm NIR stent to the mid RCA).  He was noted  to have a 50% ostial stenosis at the time of his heart catheterization    CORONARY ARTERY BYPASS GRAFT  01/17/2006   x3 using a left internal mammary artery graft to left anterior descending coronary artery, saphenous vein graft to obtuse marginal branch, saphenous vein graft to posterior descending branch / Endoscopic vein harvesting from the right leg.   GASTROCNEMIUS RECESSION  03/11/2012   Procedure: GASTROCNEMIUS SLIDE;  Surgeon: Wylene Simmer, MD;  Location: [place];  Service: Orthopedics;  Laterality: Right;   HIP FRACTURE SURGERY     right   TENDON REPAIR  03/11/2012   Procedure: TENDON REPAIR;  Surgeon: Wylene Simmer, MD;  Location: [place];  Service: Orthopedics;  Laterality: Right;  Right Tibialis Tendon Repair With Gastrocnemius Recession; Possible PLANTARIS AUTOGRAFT    TONSILLECTOMY     TONSILLECTOMY     VASECTOMY       Family History: Family History  Problem Relation Age of Onset   Heart attack Father    Stroke Mother     Social History: Social History   Socioeconomic History   Marital status: Married    Spouse name: Not on file   Number of children: Not on file   Years of education: Not on file   Highest education level: Not on file  Occupational History   Not on file  Tobacco Use   Smoking status: Former Smoker    Types: Cigarettes    Quit date: 02/24/1965    Years since quitting: 54.7   Smokeless tobacco: Never Used  Substance and Sexual Activity   Alcohol use: No   Drug use: No   Sexual activity: Not on file  Other Topics Concern   Not on file  Social History Narrative   Not on file   Social Determinants of Health   Financial Resource Strain:    Difficulty of Paying Living Expenses: Not on file  Food Insecurity:    Worried About Sales executive in the Last Year: Not on file   [employer] of Food in the Last Year: Not on file  Transportation Needs:    Lack of Transportation (Medical): Not on file   Lack of Transportation (Non-Medical): Not on file  Physical Activity:    Days of Exercise per Week: Not on file   Minutes of Exercise per Session: Not on file  Stress:    Feeling of Stress : Not on file  Social Connections:    Frequency of Communication with Friends and Family: Not on file   Frequency of Social Gatherings with Friends and Family: Not on file   Attends Religious Services: Not on file   Active Member of Clubs or Organizations: Not on file   Attends Archivist Meetings: Not on  file   Marital Status: Not on file    Allergies: Allergies  Allergen Reactions   Cozaar    Ace Inhibitors Itching    Ache, itching..   Crestor [Rosuvastatin Calcium]     Muscle aches   Lipitor [Atorvastatin Calcium]     Muscle aches   Lisinopril     REACTION: rash ?   Lovastatin     Muscle aches   Pravastatin     Muscle aches   Statins     Other reaction(s): Malaise (intolerance)   Zetia [Ezetimibe] Diarrhea   Zocor [Simvastatin - High Dose]     Muscle aches   Latex Rash    Outpatient Meds: Current Outpatient Medications  Medication Sig Dispense Refill   aspirin 81 MG tablet Take 81 mg by mouth every evening.      Cholecalciferol 25 MCG (1000 UT) capsule Take 1 capsule by mouth daily.     finasteride (PROSCAR) 5 MG tablet Take 5 mg by mouth at bedtime.     Flaxseed, Linseed, POWD Take 1 tablet by mouth daily.      nitroGLYCERIN (NITROSTAT) 0.4 MG SL tablet DISSOLVE ONE TABLET UNDER THE TONGUE EVERY 5 MINUTES AS NEEDED FOR CHEST PAIN. 25 tablet 5   REPATHA SURECLICK 814 MG/ML SOAJ INJECT 1 SYRINGE EVERY 14 DAYS 2 mL 11   sertraline (ZOLOFT) 25 MG tablet Take 25 mg by mouth daily.     tamsulosin (FLOMAX) 0.4 MG CAPS capsule Take 1 capsule by mouth daily.      Travoprost, BAK Free,  (TRAVATAN) 0.004 % SOLN ophthalmic solution Place 1 drop into both eyes at bedtime.     No current facility-administered medications for this visit.      ___________________________________________________________________ Objective   Exam:  BP 138/68    Pulse 79    Ht 5\' 7"  (1.702 m)    Wt 154 lb (69.9 kg)    BMI 24.12 kg/m    General: Pleasant and conversational elderly man, hard of hearing, bilateral hearing aids.  He defers to his wife for questions.  Antalgic gait, slow with the aid of a walker, but gets on exam table with assistance.  Eyes: sclera anicteric, no redness  ENT: oral mucosa moist without lesions, no cervical or supraclavicular lymphadenopathy.  Poor dentition  CV: RRR without murmur, S1/S2, no JVD, no peripheral edema  Resp: clear to auscultation bilaterally, normal RR and effort noted  GI: soft, no tenderness, with active bowel sounds. No guarding or palpable organomegaly noted.  Skin; warm and dry, no rash or jaundice noted.  Neuro: awake, alert .  Difficulty recalling facts and dates.  Normal gross motor function and fluent speech Rectal: Normal external.  Enlarged nontender prostate, scant heme-negative stool in rectal vault.  No palpable mass. Labs:  No recent labs or other data from [place].  Last hematocrit and creatinine normal in June and September 2020, respectively.   Assessment: Encounter Diagnosis  Name Primary?   Chronic diarrhea Yes    Unclear history, diarrhea for months, unknown infection with unknown treatment at [facility] within the last 4 to 6 weeks.  Persistent abnormal bowel habits, though perhaps not as severe as before. Concern for ongoing infection, especially C. difficile. Plan:  C. difficile PCR and ova and parasites CBC and CMP today  Asked his wife to closely monitor Johnny [name] bowel function, frequency, form and appearance of stools over the next couple of weeks.  If above studies unrevealing, colonoscopy to follow.  She believes  that  she could help him successfully prep for colonoscopy if necessary.  Thank you for the courtesy of this consult.  Please call me with any questions or concerns.  Nelida Meuse III  CC: Referring provider noted above Also copy to Dr. Cristie Hem, the patient's local PCP at [facility].   Addendum after office visit for record review  This patient's wife brought some papers by the office later in the day that they received from the [place] hospital  Stool studies dated 09/12/2019 reportedly positive for lactoferrin and Campylobacter, treated with a azithromycin.  There was also "fat in the stool".  Stool negative for ova and parasites

## 2019-11-16 NOTE — Patient Instructions (Addendum)
If you are age 84 or older, your body mass index should be between 23-30. Your Body mass index is 24.12 kg/m. If this is out of the aforementioned range listed, please consider follow up with your Primary Care Provider.  If you are age 87 or younger, your body mass index should be between 19-25. Your Body mass index is 24.12 kg/m. If this is out of the aformentioned range listed, please consider follow up with your Primary Care Provider.   It was a pleasure to see you today!  Dr. Loletha Carrow

## 2019-11-17 ENCOUNTER — Telehealth: Payer: Self-pay | Admitting: Gastroenterology

## 2019-11-17 NOTE — Telephone Encounter (Signed)
Spoke with patient's wife regarding lab results, see 11/16/19 result note for more information

## 2019-11-17 NOTE — Telephone Encounter (Signed)
Called patient twice, fast busy signal. Will attempt later.

## 2019-11-24 ENCOUNTER — Telehealth: Payer: Self-pay | Admitting: Gastroenterology

## 2019-11-24 NOTE — Telephone Encounter (Signed)
Pt's spouse Is requesting a call back from a nurse to discuss the results from the bowel specimen.

## 2019-11-24 NOTE — Telephone Encounter (Signed)
Spoke with patient's wife regarding stool studies, advised that C. Diff test was negative and she is aware that we are still waiting on O/P stool study results, advised that there had been a delay in the lab. Advised that we would give her a call when it has resulted and any recommendations from Dr. Loletha Carrow. Patient's wife verbalized understanding.

## 2019-11-28 LAB — OVA AND PARASITE EXAMINATION
CONCENTRATE RESULT:: NONE SEEN
MICRO NUMBER:: 10983551
SPECIMEN QUALITY:: ADEQUATE
TRICHROME RESULT:: NONE SEEN

## 2019-11-28 LAB — CLOSTRIDIUM DIFFICILE TOXIN B, QUALITATIVE, REAL-TIME PCR: Toxigenic C. Difficile by PCR: NOT DETECTED

## 2019-12-19 ENCOUNTER — Ambulatory Visit: Payer: Medicare Other | Admitting: Cardiovascular Disease

## 2019-12-19 ENCOUNTER — Encounter: Payer: Self-pay | Admitting: Cardiovascular Disease

## 2019-12-19 ENCOUNTER — Other Ambulatory Visit: Payer: Self-pay

## 2019-12-19 ENCOUNTER — Encounter (INDEPENDENT_AMBULATORY_CARE_PROVIDER_SITE_OTHER): Payer: Self-pay

## 2019-12-19 VITALS — BP 142/66 | HR 80 | Ht 67.0 in | Wt 158.2 lb

## 2019-12-19 DIAGNOSIS — M791 Myalgia, unspecified site: Secondary | ICD-10-CM | POA: Diagnosis not present

## 2019-12-19 NOTE — Patient Instructions (Addendum)
Medication Instructions:  No changes *If you need a refill on your cardiac medications before your next appointment, please call your pharmacy*   Lab Work:  TODAY SED RATE AND CRP CPK If you have labs (blood work) drawn today and your tests are completely normal, you will receive your results only by: [name] MyChart Message (if you have MyChart) OR . A paper copy in the mail If you have any lab test that is abnormal or we need to change your treatment, we will call you to review the results.   Testing/Procedures: NONE   Follow-Up: At [facility], you and your health needs are our priority.  As part of our continuing mission to provide you with exceptional heart care, we have created designated Provider Care Teams.  These Care Teams include your primary Cardiologist (physician) and Advanced Practice Providers (APPs -  Physician Assistants and Nurse Practitioners) who all work together to provide you with the care you need, when you need it.  We recommend signing up for the patient portal called "MyChart".  Sign up information is provided on this After Visit Summary.  MyChart is used to connect with patients for Virtual Visits (Telemedicine).  Patients are able to view lab/test results, encounter notes, upcoming appointments, etc.  Non-urgent messages can be sent to your provider as well.   To learn more about what you can do with MyChart, go to [email or web address].    Your next appointment:   1 year(s)  The format for your next appointment:   In Person  Provider:   [name], MD   Other Instructions

## 2019-12-19 NOTE — Progress Notes (Signed)
Cardiology Office Note   Date:  12/19/2019   ID:  Johnny [name]., DOB [birth year], MRN [id number]  PCP:  Johnny Boston, MD  Cardiologist:   Johnny Moores, MD   Chief Complaint  Patient presents with  . Coronary Artery Disease   1. Coronary artery disease-status post CABG in 2007 2. Diabetes mellitus 3. Hyperlipidemia 4. Hypertension   86 yo gentleman with a hx of [name] - s/p CABG, Johnny [name],   He's had some recent episodes of dizziness. His blood pressure has also been elevated. He felt poorly. He denies any chest pain or shortness breath. He had lots of diaphoresis. He called me last week complaining of not feeling well. It was difficult for him to describe exactly what his symptoms were but he did not describe any chest pain or shortness breath. He did have the sensation of flushing and also had profound diaphoresis. His blood pressure was noted to be elevated. He was seen by Johnny [name] here in the office. She increase his carvedilol and he presents today for further evaluation.  He 's feeling somewhat fatigued today. He complains of left arm and left leg numbness. He was walking up until 4-5 weeks ago ( 50 minutes a day 5-6 days a week without problems)  He has restarted his Aspirin and his coreg. He has not had any angina.   August 25, 2012:  Johnny [name] ruptured a tendon in his right leg and is still having problems. No CP   Dec. 9, 2014: Johnny [name] was found to have a superficial DVT. He's been on Lovenox for the past 7 days. He has had a kidney stone. He also has had a ruptured tendon in his right leg earlier this year. He has not as active.  His wife states that he is tired. He has lost 40 lbs this past 6 months. I suspect that these 2 complaints are related. He has chills, no fevers, no sweats. He is eating OK - he quit drinking sodas which may be contributing to this weight loss. His BP is high here but his wife states that his BP is normal and even low at home.   August 02, 2013:  Johnny [name] has continued to have lots of issues - kidney stones, fracture of hip, . No cardiac issues.  He's been going to the [facility]. He's had an echocardiogram which revealed normal left and her systolic function with an EF of 55%. He had mild aortic sclerosis. He has trace mitral regurgitation and mild tricuspid regurgitation. He also has had a pharmacological stress Myoview study at the [place] which was unremarkable.   Dec. 4, 2015:  Johnny [name] is a 86 y.o. man who I seen for [name] and Johnny [name]. He also has a history of hyperlipidemia and diabetes mellitus. Everything seems to be going much better. Walking daily on a treadmill . Feels well on the treadmill.  Had a carotid duplex that showed mild irreg.    Jul 13, 2014:  Johnny [name]. is a 86 y.o. male who presents for follow up of his [name] He is doing well.  No CP Was seen with his wife.  Memory seems to be challenged at times Wants to start going back to the [facility] office.   Nov. 15 ,2016:  Johnny [name]  Nov. 15, 2016:  Johnny [name] is doing ok.    No CP  BP  Has been OK Is a bit high today  Jul 20, 2015:  Johnny [name] is doing  Well from a cardiac standpoint Has had some podiatry work ,    Aug. 31, 2017: Johnny [name] was admitted for extreme hypertension. He had positive cardiac enzymes consistent with a non-ST segment elevation myocardial infarction. Cardia catheterization revealed mild to moderate irregularities.  He was started on isosorbide but developed lightheadedness and a headache.   Has not been exercising    Dec. 1, 2017 Johnny [name] is seen today  Continues to be very tired.   Is not walking  No angina .    He seems to just not want to go out and exercise   Aug. 23, 2018:  Johnny [name] is seen with his wife,  Johnny [name] today  Still tired.  Not inclined to get out and do anything.   Drinking water - perhaps not as much as he should .   June 08, 2017:  Johnny [name] is seen with his wife,  Johnny [name] today   Doing well from a cardiac standpoint .    No CP .  Gets DOE at times  Is falling on occasion.   Has had elevated calcium levels and needs to have parathyroid surgery .   September 09, 2017: He had parathyroid surgery since I last saw him  He feels the same .  BP dropped after his surgery .   Then gradually increased  Has been started on Valsartan  ( wife does not know the dose  but it is 1/2 tablet a day)   June 16, 2019 Johnny [name] is seen today for follow-up of his coronary artery disease and hypertension. They had moved to [facility] for last year.   Complains of feeling poorly when he wakes up .  Feels very fatigued and hurts  Still drinks coca cola every day .   No CP ,  Breathing is ok . Has some DOE . Does not use his CPAP .   Oct. 25, 2021: Johnny [name], Johnny [name] His dementia is getting worse.  Complains of being sore all over.  No new meds, no new activity  Will get a CPK, CRP, ESR to look for PMR.  Will forward these results to Dr. Jacalyn Lefevre.      Past Medical History:  Diagnosis Date  . Aortic atherosclerosis ([place])   . Atherosclerotic heart disease   . BPH (benign prostatic hyperplasia)   . Calculus of kidney   . Calculus of ureter   . CORONARY ARTERY DISEASE Nov 2007   CABG x3   . DIABETES MELLITUS, TYPE II   . Dry skin dermatitis   . Glaucoma   . Hammer toe   . Hip fracture, right ([place])   . History of kidney stones   . Hydronephrosis   . HYPERLIPIDEMIA    Not able to take statins.  [name] HYPERTENSION   . IDA (iron deficiency anemia)   . Kidney stone 2015  . Peripheral vascular disease, unspecified ([place])   . Persistent mood disorder ([place])   . Presence of intraocular lens   . Toe deformity   . Vitamin B 12 deficiency   . Vitamin D deficiency     Past Surgical History:  Procedure Laterality Date  . CARDIAC CATHETERIZATION N/A 10/09/2015   Procedure: Left Heart Cath and Cors/Grafts Angiography;  Surgeon: Jettie Booze, MD;  Location:  [place] [facility];  Service: Cardiovascular;  Laterality: N/A;  . CORONARY ANGIOPLASTY WITH STENT PLACEMENT  01/09/2006   Three-vessel [name]  His right coronary artery is extremely tight.  He has moderate to severe disease in the LAD & left circumflex artery.  The LAD is very heavily calcified and the proximal and ostial LAD is not a good target for PTCA.  We will refer him to CVTS for further evaluation  . CORONARY ANGIOPLASTY WITH STENT PLACEMENT  02/14/2000   EF - of around 65-70%./PTCA and stenting of his mid right  coronary artery (4.0 x 18 mm NIR stent to the mid RCA).  He was noted  to have a 50% ostial stenosis at the time of his heart catheterization   . CORONARY ARTERY BYPASS GRAFT  01/17/2006   x3 using a left internal mammary artery graft to left anterior descending coronary artery, saphenous vein graft to obtuse marginal branch, saphenous vein graft to posterior descending branch / Endoscopic vein harvesting from the right leg.  [name] GASTROCNEMIUS RECESSION  03/11/2012   Procedure: GASTROCNEMIUS SLIDE;  Surgeon: Wylene Simmer, MD;  Location: [place];  Service: Orthopedics;  Laterality: Right;  . HIP FRACTURE SURGERY     right  . TENDON REPAIR  03/11/2012   Procedure: TENDON REPAIR;  Surgeon: Wylene Simmer, MD;  Location: [place];  Service: Orthopedics;  Laterality: Right;  Right Tibialis Tendon Repair With Gastrocnemius Recession; Possible PLANTARIS AUTOGRAFT   . TONSILLECTOMY    . TONSILLECTOMY    . VASECTOMY       Current Outpatient Medications  Medication Sig Dispense Refill  . aspirin 81 MG tablet Take 81 mg by mouth every evening.     . Cholecalciferol 25 MCG (1000 UT) capsule Take 1 capsule by mouth daily.    . finasteride (PROSCAR) 5 MG tablet Take 5 mg by mouth at bedtime.    . Flaxseed, Linseed, POWD Take 1 tablet by mouth daily.     . memantine (NAMENDA) 10 MG tablet Take 10 mg by mouth 2 (two) times daily.    . nitroGLYCERIN (NITROSTAT) 0.4  MG SL tablet DISSOLVE ONE TABLET UNDER THE TONGUE EVERY 5 MINUTES AS NEEDED FOR CHEST PAIN. 25 tablet 5  . REPATHA SURECLICK 301 MG/ML SOAJ INJECT 1 SYRINGE EVERY 14 DAYS 2 mL 11  . sertraline (ZOLOFT) 25 MG tablet Take 25 mg by mouth daily.    . tamsulosin (FLOMAX) 0.4 MG CAPS capsule Take 1 capsule by mouth daily.     . Travoprost, BAK Free, (TRAVATAN) 0.004 % SOLN ophthalmic solution Place 1 drop into both eyes at bedtime.     No current facility-administered medications for this visit.    Allergies:   Cozaar, Ace inhibitors, Crestor [rosuvastatin calcium], Lipitor [atorvastatin calcium], Lisinopril, Lovastatin, Pravastatin, Statins, Zetia [ezetimibe], Zocor [simvastatin - high dose], and Latex    Social History:  The patient  reports that he quit smoking about 54 years ago. His smoking use included cigarettes. He has never used smokeless tobacco. He reports that he does not drink alcohol and does not use drugs.   Family History:  The patient's family history includes Heart attack in his father; Stroke in his mother.    ROS:   Noted in current history, otherwise review of systems is negative.  Physical Exam: Blood pressure (!) 142/66, pulse 80, height $RemoveBe'5\' 7"'rDawEhzgA$  (1.702 m), weight 158 lb 3.2 oz (71.8 kg), SpO2 96 %.  GEN:  Well nourished, well developed in no acute distress HEENT: Normal NECK: No JVD; No carotid bruits LYMPHATICS: No lymphadenopathy CARDIAC: RRR , no murmurs, rubs, gallops RESPIRATORY:  Clear to auscultation without rales, wheezing or rhonchi  ABDOMEN: Soft, non-tender, non-distended MUSCULOSKELETAL:  No edema; No deformity  SKIN: Warm and dry NEUROLOGIC:  Alert and oriented x 3   ECG:          Recent Labs: 11/16/2019: ALT 13; BUN 17; Creatinine, Ser 1.13; Hemoglobin 13.5; Platelets 361.0; Potassium 4.3; Sodium 132    Lipid Panel    Component Value Date/Time   CHOL 120 02/02/2019 1051   TRIG 174 (H) 02/02/2019 1051   HDL 36 (L) 02/02/2019 1051   CHOLHDL  3.3 02/02/2019 1051   CHOLHDL 7.4 10/07/2015 0228   VLDL 31 10/07/2015 0228   LDLCALC 55 02/02/2019 1051   LDLDIRECT 167.1 08/18/2012 0916      Wt Readings from Last 3 Encounters:  12/19/19 158 lb 3.2 oz (71.8 kg)  11/16/19 154 lb (69.9 kg)  06/16/19 154 lb 8 oz (70.1 kg)      Other studies Reviewed: Additional studies/ records that were reviewed today include: . Review of the above records demonstrates:    ASSESSMENT AND PLAN:  1. Coronary artery disease-  No angina .  Cont current meds.    2. Diabetes mellitus -  Eats lots of sweets.  Further recs per Dr. Jacalyn Lefevre   3. Hyperlipidemia -,    Managed by Dr. Jacalyn Lefevre   4. Hypertension -  BP is well controlled.    5. Generalized muscle soreness.   He cannot tell us why or exactily where he hurts.  Will get CPK, CRP, ESR to look for possible explanations   Current medicines are reviewed at length with the patient today.  The patient does not have concerns regarding medicines.  The following changes have been made:  no change  Labs/ tests ordered today include:   Orders Placed This Encounter  Procedures  . Sedimentation rate  . C-reactive protein  . CK (Creatine Kinase)    Disposition:     Johnny Moores, MD  12/19/2019 1:48 PM    [facility] [place], [place], [place]  [address] Phone: [phone]; Fax: [phone]

## 2019-12-20 LAB — C-REACTIVE PROTEIN: CRP: <1 mg/L (ref 0–10)

## 2019-12-20 LAB — CK: Total CK: 51 U/L (ref 30–208)

## 2019-12-20 LAB — SEDIMENTATION RATE: Sed Rate: 2 mm/hr (ref 0–30)

## 2020-02-06 ENCOUNTER — Emergency Department (HOSPITAL_COMMUNITY)
Admission: EM | Admit: 2020-02-06 | Discharge: 2020-02-06 | Disposition: A | Discharge status: Home / Self Care | Payer: No Typology Code available for payment source | Attending: Emergency Medicine | Admitting: Emergency Medicine

## 2020-02-06 ENCOUNTER — Other Ambulatory Visit: Payer: Self-pay

## 2020-02-06 ENCOUNTER — Encounter (HOSPITAL_COMMUNITY): Payer: Self-pay | Admitting: *Deleted

## 2020-02-06 DIAGNOSIS — Z951 Presence of aortocoronary bypass graft: Secondary | ICD-10-CM | POA: Diagnosis not present

## 2020-02-06 DIAGNOSIS — R197 Diarrhea, unspecified: Secondary | ICD-10-CM

## 2020-02-06 DIAGNOSIS — Z87891 Personal history of nicotine dependence: Secondary | ICD-10-CM | POA: Diagnosis not present

## 2020-02-06 DIAGNOSIS — E119 Type 2 diabetes mellitus without complications: Secondary | ICD-10-CM | POA: Diagnosis not present

## 2020-02-06 DIAGNOSIS — I2510 Atherosclerotic heart disease of native coronary artery without angina pectoris: Secondary | ICD-10-CM | POA: Diagnosis not present

## 2020-02-06 DIAGNOSIS — K649 Unspecified hemorrhoids: Secondary | ICD-10-CM | POA: Insufficient documentation

## 2020-02-06 DIAGNOSIS — I10 Essential (primary) hypertension: Secondary | ICD-10-CM | POA: Insufficient documentation

## 2020-02-06 DIAGNOSIS — Z91040 Latex allergy status: Secondary | ICD-10-CM | POA: Diagnosis not present

## 2020-02-06 LAB — COMPREHENSIVE METABOLIC PANEL
ALT: 14 U/L (ref 0–44)
AST: 20 U/L (ref 15–41)
Albumin: 3.8 g/dL (ref 3.5–5.0)
Alkaline Phosphatase: 64 U/L (ref 38–126)
Anion gap: 11 (ref 5–15)
BUN: 14 mg/dL (ref 8–23)
CO2: 22 mmol/L (ref 22–32)
Calcium: 9 mg/dL (ref 8.9–10.3)
Chloride: 101 mmol/L (ref 98–111)
Creatinine, Ser: 1.12 mg/dL (ref 0.61–1.24)
GFR, Estimated: >60 mL/min (ref >60)
Glucose, Bld: 132 mg/dL — ABNORMAL HIGH (ref 70–99)
Potassium: 4.6 mmol/L (ref 3.5–5.1)
Sodium: 134 mmol/L — ABNORMAL LOW (ref 135–145)
Total Bilirubin: 0.7 mg/dL (ref 0.3–1.2)
Total Protein: 6.5 g/dL (ref 6.5–8.1)

## 2020-02-06 LAB — CBC
HCT: 39 % (ref 39.0–52.0)
Hemoglobin: 12.7 g/dL — ABNORMAL LOW (ref 13.0–17.0)
MCH: 29.8 pg (ref 26.0–34.0)
MCHC: 32.6 g/dL (ref 30.0–36.0)
MCV: 91.5 fL (ref 80.0–100.0)
Platelets: 437 10*3/uL — ABNORMAL HIGH (ref 150–400)
RBC: 4.26 MIL/uL (ref 4.22–5.81)
RDW: 13.2 % (ref 11.5–15.5)
WBC: 12.2 10*3/uL — ABNORMAL HIGH (ref 4.0–10.5)
nRBC: 0 % (ref 0.0–0.2)

## 2020-02-06 LAB — LIPASE, BLOOD: Lipase: 23 U/L (ref 11–51)

## 2020-02-06 LAB — POC OCCULT BLOOD, ED: Fecal Occult Bld: NEGATIVE

## 2020-02-06 NOTE — ED Triage Notes (Signed)
Wife states pt has had diarrhea for weeks, noticed bright red bleeding in stool Friday night.

## 2020-02-06 NOTE — ED Provider Notes (Signed)
[facility] DEPT Provider Note   CSN: [id number] Arrival date & time: 02/06/20  0945     History Chief Complaint  Patient presents with  . Diarrhea  . Rectal Bleeding    Johnny [name]. is a 86 y.o. male.  HPI      86yo male with history of CAD, hypertension, DM, hyperlipidemia, PVD, presents with concern for months of diarrhea with bright red blood per rectum with bowel movements since Friday.  Diarrhea for months, [facility] to Dr. Marca Ancon[facility], did not see problems, but has had diarrhea for months The other day, he began to have bleeding The first time was a good bit, for the last few days Doesn't eat much, very little, some night at dinner says he will not eat. Still going to the bathroom, diarrhea 6 times per day, then had bright red blood Started on Friday, has been with nearly every bowel movement since then.  Diarrhea this AM was brown, has been tarry in the past.  Has done stool stamples for gastroenterologist but was more formed then and dark.  Did not start medication right before this started.  Bright red blood on the toilet and on tissue paper, had maybe clot on Friday, none since  No nausea or vomiting, fevers, not on blood thinners Feeling generally weak  Past Medical History:  Diagnosis Date  . Aortic atherosclerosis ([place])   . Atherosclerotic heart disease   . BPH (benign prostatic hyperplasia)   . Calculus of kidney   . Calculus of ureter   . CORONARY ARTERY DISEASE Nov 2007   CABG x3   . DIABETES MELLITUS, TYPE II   . Dry skin dermatitis   . Glaucoma   . Hammer toe   . Hip fracture, right ([place])   . History of kidney stones   . Hydronephrosis   . HYPERLIPIDEMIA    Not able to take statins.  [name] HYPERTENSION   . IDA (iron deficiency anemia)   . Kidney stone 2015  . Peripheral vascular disease, unspecified ([place])   . Persistent mood disorder ([place])   . Presence of intraocular lens   . Toe deformity    . Vitamin B 12 deficiency   . Vitamin D deficiency     Patient Active Problem List   Diagnosis Date Noted  . Fatigue 06/16/2019  . Coronary artery disease involving native coronary artery of native heart without angina pectoris 10/16/2016  . Abnormal nuclear stress test   . Exertional dyspnea-? anginal equivalent 10/08/2015  . Elevated troponin 10/07/2015  . NSTEMI (non-ST elevated myocardial infarction) ([place]) 10/07/2015  . Essential hypertension   . Weight loss 02/01/2013  . Diabetes mellitus without complication ([place]) 93/23/[id number]  . Hyperlipidemia 07/17/2006  . Coronary atherosclerosis 07/17/2006    Past Surgical History:  Procedure Laterality Date  . CARDIAC CATHETERIZATION N/A 10/09/2015   Procedure: Left Heart Cath and Cors/Grafts Angiography;  Surgeon: Jettie Booze, MD;  Location: [place] [facility];  Service: Cardiovascular;  Laterality: N/A;  . CORONARY ANGIOPLASTY WITH STENT PLACEMENT  01/09/2006   Three-vessel CAD  His right coronary artery is extremely tight.  He has moderate to severe disease in the LAD & left circumflex artery.  The LAD is very heavily calcified and the proximal and ostial LAD is not a good target for PTCA.  We will refer him to CVTS for further evaluation  . CORONARY ANGIOPLASTY WITH STENT PLACEMENT  02/14/2000   EF -  of around 65-70%./PTCA and stenting of his mid right  coronary artery (4.0 x 18 mm NIR stent to the mid RCA).  He was noted  to have a 50% ostial stenosis at the time of his heart catheterization   . CORONARY ARTERY BYPASS GRAFT  01/17/2006   x3 using a left internal mammary artery graft to left anterior descending coronary artery, saphenous vein graft to obtuse marginal branch, saphenous vein graft to posterior descending branch / Endoscopic vein harvesting from the right leg.  [name] GASTROCNEMIUS RECESSION  03/11/2012   Procedure: GASTROCNEMIUS SLIDE;  Surgeon: Wylene Simmer, MD;  Location: [place];  Service: Orthopedics;   Laterality: Right;  . HIP FRACTURE SURGERY     right  . TENDON REPAIR  03/11/2012   Procedure: TENDON REPAIR;  Surgeon: Wylene Simmer, MD;  Location: [facility];  Service: Orthopedics;  Laterality: Right;  Right Tibialis Tendon Repair With Gastrocnemius Recession; Possible PLANTARIS AUTOGRAFT   . TONSILLECTOMY    . TONSILLECTOMY    . VASECTOMY         Family History  Problem Relation Age of Onset  . Heart attack Father   . Stroke Mother     Social History   Tobacco Use  . Smoking status: Former Smoker    Types: Cigarettes    Quit date: 02/24/1965    Years since quitting: 54.9  . Smokeless tobacco: Never Used  Substance Use Topics  . Alcohol use: No  . Drug use: No    Home Medications Prior to Admission medications   Medication Sig Start Date End Date Taking? Authorizing Provider  Cholecalciferol 25 MCG (1000 UT) capsule Take 1,000 capsules by mouth daily.   Yes [provider]  finasteride (PROSCAR) 5 MG tablet Take 5 mg by mouth daily.   Yes [provider]  loperamide (IMODIUM) 2 MG capsule Take 2 mg by mouth daily as needed for diarrhea or loose stools.   Yes [provider]  memantine (NAMENDA) 10 MG tablet Take 10 mg by mouth 2 (two) times daily.   Yes [provider]  Multiple Vitamins-Minerals (PRESERVISION AREDS 2 PO) Take 1 capsule by mouth in the morning and at bedtime.   Yes [provider]  nitroGLYCERIN (NITROSTAT) 0.4 MG SL tablet DISSOLVE ONE TABLET UNDER THE TONGUE EVERY 5 MINUTES AS NEEDED FOR CHEST PAIN. Patient taking differently: Place 0.4 mg under the tongue every 5 (five) minutes as needed for chest pain. 06/16/19  Yes Nahser, Wonda Cheng, MD  REPATHA SURECLICK 967 MG/ML SOAJ INJECT 1 SYRINGE EVERY 14 DAYS Patient taking differently: Inject 1 each into the skin See admin instructions. Every 14 days 11/01/19  Yes Nahser, Wonda Cheng, MD  sertraline (ZOLOFT) 25 MG tablet Take 25 mg by mouth daily.   Yes  [provider]  sodium chloride (OCEAN) 0.65 % SOLN nasal spray Place 1 spray into both nostrils at bedtime.   Yes [provider]  tamsulosin (FLOMAX) 0.4 MG CAPS capsule Take 1 capsule by mouth daily.    Yes [provider]  Travoprost, BAK Free, (TRAVATAN) 0.004 % SOLN ophthalmic solution Place 1 drop into both eyes at bedtime.   Yes [provider]    Allergies    Cozaar, Ace inhibitors, Crestor [rosuvastatin calcium], Lipitor [atorvastatin calcium], Lisinopril, Lovastatin, Pravastatin, Statins, Zetia [ezetimibe], Zocor [simvastatin - high dose], and Latex  Review of Systems   Review of Systems  Constitutional: Positive for appetite change. Negative for fever.  Respiratory: Negative for shortness of breath.   Cardiovascular: Negative for chest pain.  Gastrointestinal: Positive for diarrhea. Negative for abdominal pain, constipation and vomiting.  Skin: Negative for rash.  Neurological: Negative for light-headedness and headaches.    Physical Exam Updated Vital Signs BP (!) 161/68   Pulse 61   Temp 98.8 F (37.1 C) (Oral)   Resp (!) 22   Ht 5' 8.5" (1.74 m)   Wt 70.3 kg   SpO2 98%   BMI 23.22 kg/m   Physical Exam Vitals and nursing note reviewed.  Constitutional:      General: He is not in acute distress.    Appearance: Normal appearance. He is not ill-appearing, toxic-appearing or diaphoretic.  HENT:     Head: Normocephalic.  Eyes:     Conjunctiva/sclera: Conjunctivae normal.  Cardiovascular:     Rate and Rhythm: Normal rate and regular rhythm.     Pulses: Normal pulses.  Pulmonary:     Effort: Pulmonary effort is normal. No respiratory distress.  Abdominal:     General: There is no distension.     Palpations: There is no mass.     Tenderness: There is no abdominal tenderness. There is no right CVA tenderness or left CVA tenderness.     Hernia: No hernia is present.  Musculoskeletal:        General: No deformity or signs of  injury.     Cervical back: No rigidity.  Skin:    General: Skin is warm and dry.     Coloration: Skin is not jaundiced or pale.  Neurological:     General: No focal deficit present.     Mental Status: He is alert and oriented to person, place, and time.     ED Results / Procedures / Treatments   Labs (all labs ordered are listed, but only abnormal results are displayed) Labs Reviewed  COMPREHENSIVE METABOLIC PANEL - Abnormal; Notable for the following components:      Result Value   Sodium 134 (*)    Glucose, Bld 132 (*)    All other components within normal limits  CBC - Abnormal; Notable for the following components:   WBC 12.2 (*)    Hemoglobin 12.7 (*)    Platelets 437 (*)    All other components within normal limits  LIPASE, BLOOD  POC OCCULT BLOOD, ED    EKG None  Radiology No results found.  Procedures Procedures (including critical care time)  Medications Ordered in ED Medications - No data to display  ED Course  I have reviewed the triage vital signs and the nursing notes.  Pertinent labs & imaging results that were available during my care of the patient were reviewed by me and considered in my medical decision making (see chart for details).    MDM Rules/Calculators/A&P                          86yo male with history of CAD, hypertension, DM, hyperlipidemia, PVD, presents with concern for months of diarrhea with bright red blood per rectum with bowel movements since Friday.  No abdominal pain, no tenderness on exam, doubt diverticulits.  No symptoms to suggest obstruction.  Regarding rectal bleeding--has had symptoms for a few days with normal blood pressures and hemoglobin 12.7 and doubt clinically significant GI bleed that would require admission at this time.  Rectal exam with hemorrhoids and occult blood negative.  Suspect hemorrhoids in setting of frequent BM. No  significant electrolyte abnormalities, Cr normal. Recommend close follow up with GI,  continued supportive care and continuing regular medications  Sent message to Dr. Loletha Carrow GI.  Patient discharged in stable condition with understanding of reasons to return.    Final Clinical Impression(s) / ED Diagnoses Final diagnoses:  Diarrhea, unspecified type  Hemorrhoids, unspecified hemorrhoid type    Rx / DC Orders ED Discharge Orders    None       Johnny Morgan, MD 02/06/20 2259

## 2020-02-07 ENCOUNTER — Other Ambulatory Visit: Payer: Self-pay

## 2020-02-07 ENCOUNTER — Telehealth: Payer: Self-pay | Admitting: Gastroenterology

## 2020-02-07 ENCOUNTER — Encounter (HOSPITAL_COMMUNITY): Payer: Self-pay

## 2020-02-07 DIAGNOSIS — I2510 Atherosclerotic heart disease of native coronary artery without angina pectoris: Secondary | ICD-10-CM | POA: Diagnosis not present

## 2020-02-07 DIAGNOSIS — E039 Hypothyroidism, unspecified: Secondary | ICD-10-CM | POA: Insufficient documentation

## 2020-02-07 DIAGNOSIS — Z87442 Personal history of urinary calculi: Secondary | ICD-10-CM | POA: Insufficient documentation

## 2020-02-07 DIAGNOSIS — Z79899 Other long term (current) drug therapy: Secondary | ICD-10-CM | POA: Diagnosis not present

## 2020-02-07 DIAGNOSIS — Z951 Presence of aortocoronary bypass graft: Secondary | ICD-10-CM | POA: Diagnosis not present

## 2020-02-07 DIAGNOSIS — Z91040 Latex allergy status: Secondary | ICD-10-CM | POA: Insufficient documentation

## 2020-02-07 DIAGNOSIS — Z87891 Personal history of nicotine dependence: Secondary | ICD-10-CM | POA: Insufficient documentation

## 2020-02-07 DIAGNOSIS — I10 Essential (primary) hypertension: Secondary | ICD-10-CM | POA: Insufficient documentation

## 2020-02-07 DIAGNOSIS — K625 Hemorrhage of anus and rectum: Secondary | ICD-10-CM | POA: Diagnosis not present

## 2020-02-07 DIAGNOSIS — E119 Type 2 diabetes mellitus without complications: Secondary | ICD-10-CM | POA: Insufficient documentation

## 2020-02-07 DIAGNOSIS — Z20822 Contact with and (suspected) exposure to covid-19: Secondary | ICD-10-CM | POA: Insufficient documentation

## 2020-02-07 DIAGNOSIS — R197 Diarrhea, unspecified: Secondary | ICD-10-CM | POA: Diagnosis present

## 2020-02-07 LAB — COMPREHENSIVE METABOLIC PANEL
ALT: 15 U/L (ref 0–44)
AST: 20 U/L (ref 15–41)
Albumin: 3.8 g/dL (ref 3.5–5.0)
Alkaline Phosphatase: 67 U/L (ref 38–126)
Anion gap: 9 (ref 5–15)
BUN: 17 mg/dL (ref 8–23)
CO2: 21 mmol/L — ABNORMAL LOW (ref 22–32)
Calcium: 8.9 mg/dL (ref 8.9–10.3)
Chloride: 102 mmol/L (ref 98–111)
Creatinine, Ser: 1.05 mg/dL (ref 0.61–1.24)
GFR, Estimated: >60 mL/min (ref >60)
Glucose, Bld: 120 mg/dL — ABNORMAL HIGH (ref 70–99)
Potassium: 3.8 mmol/L (ref 3.5–5.1)
Sodium: 132 mmol/L — ABNORMAL LOW (ref 135–145)
Total Bilirubin: 0.6 mg/dL (ref 0.3–1.2)
Total Protein: 6.3 g/dL — ABNORMAL LOW (ref 6.5–8.1)

## 2020-02-07 LAB — CBC
HCT: 40 % (ref 39.0–52.0)
Hemoglobin: 13 g/dL (ref 13.0–17.0)
MCH: 29.6 pg (ref 26.0–34.0)
MCHC: 32.5 g/dL (ref 30.0–36.0)
MCV: 91.1 fL (ref 80.0–100.0)
Platelets: 423 10*3/uL — ABNORMAL HIGH (ref 150–400)
RBC: 4.39 MIL/uL (ref 4.22–5.81)
RDW: 13.1 % (ref 11.5–15.5)
WBC: 13.7 10*3/uL — ABNORMAL HIGH (ref 4.0–10.5)
nRBC: 0 % (ref 0.0–0.2)

## 2020-02-07 LAB — LIPASE, BLOOD: Lipase: 28 U/L (ref 11–51)

## 2020-02-07 LAB — TYPE AND SCREEN
ABO/RH(D): B POS
Antibody Screen: NEGATIVE

## 2020-02-07 NOTE — Telephone Encounter (Signed)
Spoke with patient's wife in regards to Dr. Loletha Carrow' recommendations, she is aware that patient will need to go back to the ED for admission. She is aware that I will forward this to hospital consult team at [facility]. Wife verbalized understanding of information and had no concerns at the end of the call.  Dr. Silverio Decamp and Nevin Bloodgood, please see previous ED notes. Patient has chronic diarrhea and rectal bleeding, returning to [facility] ED for admission per Dr. Loletha Carrow.   Thank you

## 2020-02-07 NOTE — Telephone Encounter (Signed)
Spoke with patient's wife, patient has been scheduled for a follow up on 02/15/20 at 91 am. Wife verbalized understanding and had no other concerns at the end of the call.

## 2020-02-07 NOTE — Telephone Encounter (Signed)
Wife is returning call wanting to see if you could see patient sooner. She states that today has been terrible, about 5 episodes of diarrhea today - she states that near the end it has been watery diarrhea but patient has also not been eating much solid food, still having rectal bleeding she knows that this is related to the hemorrhoids. She states that she had not given the patient any Imodium today, advised to give him a dose to see if that improves his diarrhea at all. Please advise on scheduling, thank you

## 2020-02-07 NOTE — Telephone Encounter (Signed)
This is a patient I saw in September for chronic diarrhea.  He was in the ED this week for ongoing diarrhea and now rectal bleeding.  Hemoglobin was normal he was stable and discharged home for follow-up.  Please contact this wife (patient has dementia) and arrange a clinic visit with me or one of the APPs in the next 2 weeks.

## 2020-02-07 NOTE — ED Triage Notes (Signed)
Pts wife reports pt has been seen multiple times for diarrhea and recently started noticing bright red blood in his stool. Pt reports being seen here yesterday and told it was hemorrhoids, but went to see PCP after and they said the bleeding is coming from something else and the pt needs to be admitted.

## 2020-02-07 NOTE — Telephone Encounter (Signed)
This problem sounds like it is getting worse in recent days.  We also do not know for sure that the bleeding is from hemorrhoids.  Given his age and condition, I am concerned he is at risk for dehydration.  My advice is that his wife take him back to the ED today because he needs admission to the hospital.  Then he would be seen by our practice in consult and this condition can be investigated.

## 2020-02-08 ENCOUNTER — Emergency Department (HOSPITAL_COMMUNITY): Payer: No Typology Code available for payment source

## 2020-02-08 ENCOUNTER — Observation Stay (HOSPITAL_COMMUNITY)
Admission: EM | Admit: 2020-02-08 | Discharge: 2020-02-09 | Disposition: A | Discharge status: Home / Self Care | Payer: No Typology Code available for payment source | Attending: Internal Medicine | Admitting: Internal Medicine

## 2020-02-08 ENCOUNTER — Encounter (HOSPITAL_COMMUNITY): Payer: Self-pay

## 2020-02-08 DIAGNOSIS — E785 Hyperlipidemia, unspecified: Secondary | ICD-10-CM | POA: Diagnosis present

## 2020-02-08 DIAGNOSIS — K625 Hemorrhage of anus and rectum: Secondary | ICD-10-CM | POA: Diagnosis not present

## 2020-02-08 DIAGNOSIS — E039 Hypothyroidism, unspecified: Secondary | ICD-10-CM

## 2020-02-08 DIAGNOSIS — D75839 Thrombocytosis, unspecified: Secondary | ICD-10-CM

## 2020-02-08 DIAGNOSIS — I2510 Atherosclerotic heart disease of native coronary artery without angina pectoris: Secondary | ICD-10-CM | POA: Diagnosis present

## 2020-02-08 DIAGNOSIS — E119 Type 2 diabetes mellitus without complications: Secondary | ICD-10-CM

## 2020-02-08 DIAGNOSIS — E871 Hypo-osmolality and hyponatremia: Secondary | ICD-10-CM

## 2020-02-08 DIAGNOSIS — R197 Diarrhea, unspecified: Secondary | ICD-10-CM

## 2020-02-08 LAB — URINALYSIS, ROUTINE W REFLEX MICROSCOPIC
Bacteria, UA: NONE SEEN
Bilirubin Urine: NEGATIVE
Glucose, UA: NEGATIVE mg/dL
Hgb urine dipstick: NEGATIVE
Ketones, ur: NEGATIVE mg/dL
Nitrite: NEGATIVE
Protein, ur: NEGATIVE mg/dL
Specific Gravity, Urine: 1.008 (ref 1.005–1.030)
pH: 6 (ref 5.0–8.0)

## 2020-02-08 LAB — CBC
HCT: 35.8 % — ABNORMAL LOW (ref 39.0–52.0)
HCT: 37 % — ABNORMAL LOW (ref 39.0–52.0)
Hemoglobin: 11.7 g/dL — ABNORMAL LOW (ref 13.0–17.0)
Hemoglobin: 12.3 g/dL — ABNORMAL LOW (ref 13.0–17.0)
MCH: 29.7 pg (ref 26.0–34.0)
MCH: 30.4 pg (ref 26.0–34.0)
MCHC: 32.7 g/dL (ref 30.0–36.0)
MCHC: 33.2 g/dL (ref 30.0–36.0)
MCV: 90.9 fL (ref 80.0–100.0)
MCV: 91.4 fL (ref 80.0–100.0)
Platelets: 331 10*3/uL (ref 150–400)
Platelets: 337 10*3/uL (ref 150–400)
RBC: 3.94 MIL/uL — ABNORMAL LOW (ref 4.22–5.81)
RBC: 4.05 MIL/uL — ABNORMAL LOW (ref 4.22–5.81)
RDW: 13.2 % (ref 11.5–15.5)
RDW: 13.2 % (ref 11.5–15.5)
WBC: 11.8 10*3/uL — ABNORMAL HIGH (ref 4.0–10.5)
WBC: 10.6 10*3/uL — ABNORMAL HIGH (ref 4.0–10.5)
nRBC: 0 % (ref 0.0–0.2)
nRBC: 0 % (ref 0.0–0.2)

## 2020-02-08 LAB — RESP PANEL BY RT-PCR (FLU A&B, COVID) ARPGX2
Influenza A by PCR: NEGATIVE
Influenza B by PCR: NEGATIVE
SARS Coronavirus 2 by RT PCR: NEGATIVE

## 2020-02-08 LAB — GLUCOSE, CAPILLARY
Glucose-Capillary: 196 mg/dL — ABNORMAL HIGH (ref 70–99)
Glucose-Capillary: 117 mg/dL — ABNORMAL HIGH (ref 70–99)

## 2020-02-08 LAB — T4, FREE: Free T4: 0.92 ng/dL (ref 0.61–1.12)

## 2020-02-08 LAB — TSH: TSH: 5.491 u[IU]/mL — ABNORMAL HIGH (ref 0.350–4.500)

## 2020-02-08 MED ORDER — DIPHENHYDRAMINE HCL 25 MG PO CAPS
25.0000 mg | ORAL_CAPSULE | Freq: Every evening | ORAL | Status: DC | PRN
  Administered 2020-02-08: 25 mg via ORAL
  Filled 2020-02-08: qty 1

## 2020-02-08 MED ORDER — ACETAMINOPHEN 325 MG PO TABS
650.0000 mg | ORAL_TABLET | Freq: Four times a day (QID) | ORAL | Status: DC | PRN
  Administered 2020-02-08: 650 mg via ORAL
  Filled 2020-02-08: qty 2

## 2020-02-08 MED ORDER — ACETAMINOPHEN 650 MG RE SUPP: 650.0000 mg | Freq: Four times a day (QID) | RECTAL | Status: DC | PRN

## 2020-02-08 MED ORDER — LACTATED RINGERS IV BOLUS
1000.0000 mL | Freq: Once | INTRAVENOUS | Status: AC
  Administered 2020-02-08: 1000 mL via INTRAVENOUS

## 2020-02-08 MED ORDER — MEMANTINE HCL 10 MG PO TABS
10.0000 mg | ORAL_TABLET | Freq: Two times a day (BID) | ORAL | Status: DC
  Administered 2020-02-08 – 2020-02-09 (×3): 10 mg via ORAL
  Filled 2020-02-08: qty 1
  Filled 2020-02-08: qty 2
  Filled 2020-02-08: qty 1

## 2020-02-08 MED ORDER — HYDROCORTISONE ACETATE 25 MG RE SUPP
25.0000 mg | Freq: Every day | RECTAL | Status: DC
  Filled 2020-02-08: qty 1

## 2020-02-08 MED ORDER — IOHEXOL 300 MG/ML  SOLN
100.0000 mL | Freq: Once | INTRAMUSCULAR | Status: AC | PRN
  Administered 2020-02-08: 100 mL via INTRAVENOUS

## 2020-02-08 MED ORDER — SODIUM CHLORIDE (PF) 0.9 % IJ SOLN
INTRAMUSCULAR | Status: AC
  Filled 2020-02-08: qty 50

## 2020-02-08 MED ORDER — TAMSULOSIN HCL 0.4 MG PO CAPS
0.4000 mg | ORAL_CAPSULE | Freq: Every day | ORAL | Status: DC
  Administered 2020-02-08: 0.4 mg via ORAL
  Filled 2020-02-08: qty 1

## 2020-02-08 MED ORDER — FINASTERIDE 5 MG PO TABS
5.0000 mg | ORAL_TABLET | Freq: Every day | ORAL | Status: DC
  Administered 2020-02-08 – 2020-02-09 (×2): 5 mg via ORAL
  Filled 2020-02-08 (×2): qty 1

## 2020-02-08 NOTE — H&P (Signed)
History and Physical    Johnny [name]. ZOX:[id number] DOB: 07-05-1932 DOA: 02/08/2020  PCP: Reeves Dam, MD   Patient coming from: Home.  Chief Complaint: Diarrhea and rectal bleeding.  History obtained from patient's wife as patient has advanced dementia.  HPI: Johnny Mance. is a 86 y.o. male with history of CAD status post CABG, diabetes mellitus presently not on medication history of BPH hypertension has been experiencing diarrhea which has been progressively getting worse over the last 3 months.  Had seen gastroenterologist.  Initially patient was treated with antibiotics at [facility] for the diarrhea.  Because of which are not clear.  Over the last few weeks patient wife noted increasing blood in the stool.  Patient has at least 6 episodes of diarrhea.  Has not had any fever chills abdominal pain nausea or vomiting has been losing weight even before the diarrhea started.  Patient follows up with Dr. Lance Sell GI recommended admission to the hospital.  ED Course: In the ER patient afebrile and lab works are largely unremarkable except for leukocytosis and mild hyponatremia.  CT abdomen pelvis is unremarkable.  Covid test is negative.  Abdomen appears benign on exam.  Review of Systems: As per HPI, rest all negative.   Past Medical History:  Diagnosis Date   Aortic atherosclerosis ([place])    Atherosclerotic heart disease    BPH (benign prostatic hyperplasia)    Calculus of kidney    Calculus of ureter    CORONARY ARTERY DISEASE Nov 2007   CABG x3    DIABETES MELLITUS, TYPE II    Dry skin dermatitis    Glaucoma    Hammer toe    Hip fracture, right (HCC)    History of kidney stones    Hydronephrosis    HYPERLIPIDEMIA    Not able to take statins.   HYPERTENSION    IDA (iron deficiency anemia)    Kidney stone 2015   Peripheral vascular disease, unspecified (HCC)    Persistent mood disorder (HCC)    Presence of intraocular lens    Toe  deformity    Vitamin B 12 deficiency    Vitamin D deficiency     Past Surgical History:  Procedure Laterality Date   CARDIAC CATHETERIZATION N/A 10/09/2015   Procedure: Left Heart Cath and Cors/Grafts Angiography;  Surgeon: Jettie Booze, MD;  Location: [facility];  Service: Cardiovascular;  Laterality: N/A;   CORONARY ANGIOPLASTY WITH STENT PLACEMENT  01/09/2006   Three-vessel CAD  His right coronary artery is extremely tight.  He has moderate to severe disease in the LAD & left circumflex artery.  The LAD is very heavily calcified and the proximal and ostial LAD is not a good target for PTCA.  We will refer him to CVTS for further evaluation   CORONARY ANGIOPLASTY WITH STENT PLACEMENT  02/14/2000   EF - of around 65-70%./PTCA and stenting of his mid right  coronary artery (4.0 x 18 mm NIR stent to the mid RCA).  He was noted  to have a 50% ostial stenosis at the time of his heart catheterization    CORONARY ARTERY BYPASS GRAFT  01/17/2006   x3 using a left internal mammary artery graft to left anterior descending coronary artery, saphenous vein graft to obtuse marginal branch, saphenous vein graft to posterior descending branch / Endoscopic vein harvesting from the right leg.   GASTROCNEMIUS RECESSION  03/11/2012   Procedure: GASTROCNEMIUS SLIDE;  Surgeon: Wylene Simmer, MD;  Location: [facility];  Service: Orthopedics;  Laterality: Right;   HIP FRACTURE SURGERY     right   TENDON REPAIR  03/11/2012   Procedure: TENDON REPAIR;  Surgeon: Wylene Simmer, MD;  Location: [facility];  Service: Orthopedics;  Laterality: Right;  Right Tibialis Tendon Repair With Gastrocnemius Recession; Possible PLANTARIS AUTOGRAFT    TONSILLECTOMY     TONSILLECTOMY     VASECTOMY       reports that he quit smoking about 54 years ago. His smoking use included cigarettes. He has never used smokeless tobacco. He reports that he does not drink alcohol and does not use  drugs.  Allergies  Allergen Reactions   Cozaar    Ace Inhibitors Itching    Ache, itching..   Crestor [Rosuvastatin Calcium]     Muscle aches   Lipitor [Atorvastatin Calcium]     Muscle aches   Lisinopril     REACTION: rash ?   Lovastatin     Muscle aches   Pravastatin     Muscle aches   Statins     Other reaction(s): Malaise (intolerance)   Zetia [Ezetimibe] Diarrhea   Zocor [Simvastatin - High Dose]     Muscle aches   Latex Rash    Family History  Problem Relation Age of Onset   Heart attack Father    Stroke Mother     Prior to Admission medications   Medication Sig Start Date End Date Taking? Authorizing Provider  Cholecalciferol 25 MCG (1000 UT) capsule Take 1,000 capsules by mouth daily.   Yes [provider]  finasteride (PROSCAR) 5 MG tablet Take 5 mg by mouth daily.   Yes [provider]  loperamide (IMODIUM) 2 MG capsule Take 2 mg by mouth daily as needed for diarrhea or loose stools.   Yes [provider]  memantine (NAMENDA) 10 MG tablet Take 10 mg by mouth 2 (two) times daily.   Yes [provider]  Multiple Vitamins-Minerals (PRESERVISION AREDS 2 PO) Take 1 capsule by mouth in the morning and at bedtime.   Yes [provider]  REPATHA SURECLICK 474 MG/ML SOAJ INJECT 1 SYRINGE EVERY 14 DAYS Patient taking differently: Inject 1 each into the skin See admin instructions. Every 14 days 11/01/19  Yes Nahser, Wonda Cheng, MD  sertraline (ZOLOFT) 25 MG tablet Take 25 mg by mouth daily.   Yes [provider]  sodium chloride (OCEAN) 0.65 % SOLN nasal spray Place 1 spray into both nostrils at bedtime.   Yes [provider]  tamsulosin (FLOMAX) 0.4 MG CAPS capsule Take 1 capsule by mouth daily.    Yes [provider]  Travoprost, BAK Free, (TRAVATAN) 0.004 % SOLN ophthalmic solution Place 1 drop into both eyes at bedtime.   Yes [provider]  nitroGLYCERIN (NITROSTAT) 0.4 MG SL  tablet DISSOLVE ONE TABLET UNDER THE TONGUE EVERY 5 MINUTES AS NEEDED FOR CHEST PAIN. Patient taking differently: Place 0.4 mg under the tongue every 5 (five) minutes as needed for chest pain. 06/16/19   Nahser, Wonda Cheng, MD    Physical Exam: Constitutional: Moderately built and nourished. Vitals:   02/08/20 0200 02/08/20 0245 02/08/20 0300 02/08/20 0345  BP: 137/65 134/64 (!) 142/63 (!) 144/69  Pulse: 68 63 66 69  Resp: (!) 21 11 17 16   Temp:      TempSrc:      SpO2: 98% 95% 95% 95%   Eyes: Anicteric no pallor. ENMT: No discharge from the  ears eyes nose or mouth. Neck: No mass felt.  No neck rigidity. Respiratory: No rhonchi or crepitations. Cardiovascular: S1-S2 heard. Abdomen: Soft nontender bowel sounds present. Musculoskeletal: No edema. Skin: No rash. Neurologic: Alert awake oriented his name.  Moves all extremities. Psychiatric: Has dementia.   Labs on Admission: I have personally reviewed following labs and imaging studies  CBC: Recent Labs  Lab 02/06/20 1156 02/07/20 1808  WBC 12.2* 13.7*  HGB 12.7* 13.0  HCT 39.0 40.0  MCV 91.5 91.1  PLT 437* 092*   Basic Metabolic Panel: Recent Labs  Lab 02/06/20 1156 02/07/20 1808  NA 134* 132*  K 4.6 3.8  CL 101 102  CO2 22 21*  GLUCOSE 132* 120*  BUN 14 17  CREATININE 1.12 1.05  CALCIUM 9.0 8.9   GFR: Estimated Creatinine Clearance: 50.6 mL/min (by C-G formula based on SCr of 1.05 mg/dL). Liver Function Tests: Recent Labs  Lab 02/06/20 1156 02/07/20 1808  AST 20 20  ALT 14 15  ALKPHOS 64 67  BILITOT 0.7 0.6  PROT 6.5 6.3*  ALBUMIN 3.8 3.8   Recent Labs  Lab 02/06/20 1156 02/07/20 1808  LIPASE 23 28   No results for input(s): AMMONIA in the last 168 hours. Coagulation Profile: No results for input(s): INR, PROTIME in the last 168 hours. Cardiac Enzymes: No results for input(s): CKTOTAL, CKMB, CKMBINDEX, TROPONINI in the last 168 hours. BNP (last 3 results) No results for input(s): PROBNP in  the last 8760 hours. HbA1C: No results for input(s): HGBA1C in the last 72 hours. CBG: No results for input(s): GLUCAP in the last 168 hours. Lipid Profile: No results for input(s): CHOL, HDL, LDLCALC, TRIG, CHOLHDL, LDLDIRECT in the last 72 hours. Thyroid Function Tests: Recent Labs    02/08/20 0116  TSH 5.491*   Anemia Panel: No results for input(s): VITAMINB12, FOLATE, FERRITIN, TIBC, IRON, RETICCTPCT in the last 72 hours. Urine analysis:    Component Value Date/Time   COLORURINE YELLOW 02/08/2020 0352   APPEARANCEUR CLEAR 02/08/2020 0352   LABSPEC 1.008 02/08/2020 0352   PHURINE 6.0 02/08/2020 0352   GLUCOSEU NEGATIVE 02/08/2020 0352   HGBUR NEGATIVE 02/08/2020 0352   BILIRUBINUR NEGATIVE 02/08/2020 0352   KETONESUR NEGATIVE 02/08/2020 0352   PROTEINUR NEGATIVE 02/08/2020 0352   NITRITE NEGATIVE 02/08/2020 0352   LEUKOCYTESUR SMALL (A) 02/08/2020 0352   Sepsis Labs: @LABRCNTIP (procalcitonin:4,lacticidven:4) ) Recent Results (from the past 240 hour(s))  Resp Panel by RT-PCR (Flu A&B, Covid) Nasopharyngeal Swab     Status: None   Collection Time: 02/08/20  1:33 AM   Specimen: Nasopharyngeal Swab; Nasopharyngeal(NP) swabs in vial transport medium  Result Value Ref Range Status   SARS Coronavirus 2 by RT PCR NEGATIVE NEGATIVE Final    Comment: (NOTE) SARS-CoV-2 target nucleic acids are NOT DETECTED.  The SARS-CoV-2 RNA is generally detectable in upper respiratory specimens during the acute phase of infection. The lowest concentration of SARS-CoV-2 viral copies this assay can detect is 138 copies/mL. A negative result does not preclude SARS-Cov-2 infection and should not be used as the sole basis for treatment or other patient management decisions. A negative result may occur with  improper specimen collection/handling, submission of specimen other than nasopharyngeal swab, presence of viral mutation(s) within the areas targeted by this assay, and inadequate number of  viral copies(<138 copies/mL). A negative result must be combined with clinical observations, patient history, and epidemiological information. The expected result is Negative.  Fact Sheet for Patients:  EntrepreneurPulse.com.au  Fact Sheet for  Healthcare Providers:  IncredibleEmployment.be  This test is no t yet approved or cleared by the [place] and  has been authorized for detection and/or diagnosis of SARS-CoV-2 by FDA under an Emergency Use Authorization (EUA). This EUA will remain  in effect (meaning this test can be used) for the duration of the COVID-19 declaration under Section 564(b)(1) of the Act, 21 U.S.C.section 360bbb-3(b)(1), unless the authorization is terminated  or revoked sooner.       Influenza A by PCR NEGATIVE NEGATIVE Final   Influenza B by PCR NEGATIVE NEGATIVE Final    Comment: (NOTE) The Xpert Xpress SARS-CoV-2/FLU/RSV plus assay is intended as an aid in the diagnosis of influenza from Nasopharyngeal swab specimens and should not be used as a sole basis for treatment. Nasal washings and aspirates are unacceptable for Xpert Xpress SARS-CoV-2/FLU/RSV testing.  Fact Sheet for Patients: EntrepreneurPulse.com.au  Fact Sheet for Healthcare Providers: IncredibleEmployment.be  This test is not yet approved or cleared by the [place] FDA and has been authorized for detection and/or diagnosis of SARS-CoV-2 by FDA under an Emergency Use Authorization (EUA). This EUA will remain in effect (meaning this test can be used) for the duration of the COVID-19 declaration under Section 564(b)(1) of the Act, 21 U.S.C. section 360bbb-3(b)(1), unless the authorization is terminated or revoked.  Performed at [facility], [place] [address] [place], [place] [address]      Radiological Exams on Admission: CT ABDOMEN PELVIS W CONTRAST  Result Date:  02/08/2020 CLINICAL DATA:  Unintended weight loss, diarrhea, hematochezia EXAM: CT ABDOMEN AND PELVIS WITH CONTRAST TECHNIQUE: Multidetector CT imaging of the abdomen and pelvis was performed using the standard protocol following bolus administration of intravenous contrast. CONTRAST:  18mL OMNIPAQUE IOHEXOL 300 MG/ML  SOLN COMPARISON:  07/01/2013 FINDINGS: Lower chest: Mild bibasilar bronchiectasis and minimal fibrosis, left greater than right best appreciated at the costophrenic angles. No superimposed focal pulmonary infiltrate. Coronary artery bypass grafting has been performed. Global cardiac size within normal limits. Small hiatal hernia. Hepatobiliary: Mild hepatic steatosis. No enhancing liver lesion. No intra or extrahepatic biliary ductal dilation. Gallbladder unremarkable. Pancreas: Unremarkable Spleen: Unremarkable Adrenals/Urinary Tract: The adrenal glands are unremarkable. The kidneys are normal in position. Mild bilateral renal cortical atrophy left slightly greater than right. 4 mm in nonobstructing calculus noted within the lower pole of the left kidney. No ureteral calculi. No hydronephrosis. No enhancing renal masses. Marked central prostatic hypertrophy indents and protrudes into the base of the bladder. The bladder is not distended, however. Tiny bladder diverticulum is identified suggesting changes of at least mild bladder outlet obstruction. Stomach/Bowel: Stomach is within normal limits. Appendix appears normal. No evidence of bowel wall thickening, distention, or inflammatory changes. No free intraperitoneal gas or fluid. Vascular/Lymphatic: Extensive aortoiliac atherosclerotic calcification. No aortic aneurysm. Particularly prominent atherosclerotic calcification is seen at the renal ostia bilaterally as well as the mesenteric arterial vasculature, however, the degree of stenosis is not well assessed on this non arteriographic study. No pathologic adenopathy within the abdomen and pelvis.  Reproductive: As noted above, the prostate gland is markedly hypertrophied centrally, protruding into the bladder lumen. Seminal vesicles are unremarkable. Other: Rectum unremarkable.  No abdominal wall hernia identified. Musculoskeletal: Right hip pinning has been performed. Degenerative changes are seen within the lumbar spine. No acute bone abnormality. IMPRESSION: No definite radiographic explanation for the patient's reported weight loss. Peripheral vascular disease, however, is noted and, while similar to prior examination, the degree of stenosis involving the mesenteric and  renal arterial vasculature is not well assessed on this exam. If there is clinical evidence of chronic mesenteric ischemia, CT arteriography may be more helpful to better assess the degree of stenosis. Mild hepatic steatosis. Mild nonobstructing left nephrolithiasis. Marked central prostatic hypertrophy, which protrudes into the bladder lumen, with at least mild bladder outlet obstruction. The bladder is not distended at this time. Aortic Atherosclerosis (ICD10-I70.0). Electronically Signed   By: Fidela Salisbury MD   On: 02/08/2020 03:43      Assessment/Plan Principal Problem:   Rectal bleeding Active Problems:   Diabetes mellitus without complication (HCC)   Hyperlipidemia   Coronary atherosclerosis   Intractable diarrhea    1. Persistent diarrhea with rectal bleeding for which gastroenterologist had recommended admission.  CT abdomen is unremarkable we will check serial CBCs and on clear liquid diet for now await formal gastroenterology consult. 2. Diabetes mellitus type 2 is off medications after patient lost weight.  Follow CBGs. 3. CAD status post CABG as per the patient's wife patient stopped using antiplatelet agents. 4. History of BPH on finasteride and tamsulosin. 5. Advanced dementia on Namenda. 6. Leukocytosis cause not clear follow CBC.  Patient afebrile.   DVT prophylaxis: SCDs.  Avoiding anticoagulation  with history of rectal bleeding. Code Status: DNR confirmed with patient's wife. Family Communication: Patient's wife. Disposition Plan: Home. Consults called: [facility] notified through secure chat. Admission status: Observation.   Rise Patience MD [facility] Pager [phone].  If 7PM-7AM, please contact night-coverage [email or web address] Password [email or web address]  02/08/2020, 5:07 AM

## 2020-02-08 NOTE — ED Notes (Signed)
Pt placed on bed pan. Pt thought he had a BM, but when this RN took pt off of bed pan, no stool present on pt's anus, brief, or bed pan. Will attempt to get stool sample next time pt says he needs the bed pan

## 2020-02-08 NOTE — Progress Notes (Signed)
No charge progress note.  Johnny [name]. is a 86 y.o. male with a past medical history of hypertension, coronary artery disease with DES x 2 2001 and 2007 s/p CABG 2007, hyperlipidemia, peripheral vascular disease, diabetes mellitus type 2, kidney stones and dementia.  He was initially seen in our office by Dr. Loletha Carrow 11/16/2019 for further evaluation regarding diarrhea which at that point he had for several months.  He apparently was seen by the GI at the [facility] and stool studies were positive for lactoferrin and Campylobacter.  He was treated with azithromycin.  There is also fat in the stool.  His diarrhea improved but he continued to pass 3-4 soft dark stools daily.  C. difficile testing was negative.  A colonoscopy was discussed if his symptoms persisted.  Underwent a screening colonoscopy by Dr. Sharlett Iles December 2008 which showed internal hemorrhoids, no polyps.  A prior colonoscopy possibly at the age of 45 was reported as normal. Came to ED with complaint of persistent diarrhea and some rectal bleeding. Patient has an history of internal hemorrhoids. Diarrhea and bleeding improved since in the hospital. GI was consulted.  Stool studies for C. difficile and GI pathogen was sent. Plan is to start him on cholestyramine if GI pathogen is negative, if no sign of improvement then he will need colonoscopy. -Continue current management.

## 2020-02-08 NOTE — Consult Note (Addendum)
Referring Provider: Dr. Lorella Nimrod  Primary Care Physician:  Reeves Dam, MD Primary Gastroenterologist:  Dr. Wilfrid Lund   Reason for Consultation:  Rectal bleeding, diarrhea   HPI: Johnny [name]. is a 86 y.o. male with a past medical history of hypertension, coronary artery disease with DES x 2 2001 and 2007 s/p CABG 2007, hyperlipidemia, peripheral vascular disease, diabetes mellitus type 2, kidney stones and dementia.  He was initially seen in our office by Dr. Loletha Carrow 11/16/2019 for further evaluation regarding diarrhea which at that point he had for several months.  He apparently was seen by the GI at the [facility] and stool studies were positive for lactoferrin and Campylobacter.  He was treated with azithromycin.  There is also fat in the stool.  His diarrhea improved but he continued to pass 3-4 soft dark stools daily.  C. difficile testing was negative.  A colonoscopy was discussed if his symptoms persisted.  Underwent a screening colonoscopy by Dr. Sharlett Iles December 2008 which showed internal hemorrhoids, no polyps.  A prior colonoscopy possibly at the age of 54 was reported as normal.  He presented to [facility] ED 02/06/2020 due to having diarrhea and rectal bleeding. At that time, he reported having diarrhea for the past 3 - 4 months. In the ED, his hemoglobin level was 12.7. A rectal exam showed hemorrhoids and Hemoccult was negative. His rectal bleeding was suspected to be hemorrhoidal in the setting of diarrhea. He was discharged home with instructions to follow-up in our office with Dr. Loletha Carrow in our outpatient GI clinic.  However, his diarrhea and rectal bleeding worsened and he was sent back to the emergency room on 02/07/2020.  In the ED, he was afebrile.  Hemodynamically stable. Sodium 132.  Potassium 3.8.  BUN 17.  Creatinine 1.05.  WBC 13.7.  Hemoglobin 13.0.  Hematocrit 40.0.  Platelet 423.  Influenza A and B negative.  SARS coronavirus 2 negative.   An abdominal/pelvic CT showed a normal bowel without evidence of bowel wall thickening, distention or inflammatory changes.  No further diarrhea or rectal bleeding since arriving to the ED.  His wife assist with obtaining his history.  He has a history of watery diarrhea for the past 4 months.  He passes 6 watery diarrhea bowel movements daily.  No associated abdominal pain. A few weeks ago he passed a small amount of bright red blood per the rectum without passing any stool at that time.  His diarrhea became more urgent over the past week.  He continues to pass about 6 watery diarrhea stools daily.  However, on Friday 12/10 he passed a 'gush" of bright red blood per the rectum without passing any stool.  He continued to pass bright red blood per the rectum x 1 episode every evening for the next 4 days.  No NSAID use.  He is not on any anticoagulation.   No antibiotics since he was prescribed Z-Pak in the fall as noted above. He reported taking Imodium 1 tab at bedtime for the past 2 weeks.  No family history of IBD or colorectal cancer.  His appetite has been decreased for the past 3 to 4 months with associated weight loss.  His clothes are fitting looser but he and his wife are unsure how much weight he has lost.  No fever, sweats or chills.    Abdominal/pelvic CT with contrast 02/08/2020: No definite radiographic explanation for the patient's reported weight loss. Peripheral vascular disease, however,  is noted and, while similar to prior examination, the degree of stenosis involving the mesenteric and renal arterial vasculature is not well assessed on this exam. If there is clinical evidence of chronic mesenteric ischemia, CT arteriography may be more helpful to better assess the degree of stenosis.  Mild hepatic steatosis.  Mild nonobstructing left nephrolithiasis.  Marked central prostatic hypertrophy, which protrudes into the bladder lumen, with at least mild bladder outlet obstruction.  The bladder is not distended at this time.  Aortic Atherosclerosis   Past Medical History:  Diagnosis Date  . Aortic atherosclerosis ([place])   . Atherosclerotic heart disease   . BPH (benign prostatic hyperplasia)   . Calculus of kidney   . Calculus of ureter   . CORONARY ARTERY DISEASE Nov 2007   CABG x3   . DIABETES MELLITUS, TYPE II   . Dry skin dermatitis   . Glaucoma   . Hammer toe   . Hip fracture, right ([place])   . History of kidney stones   . Hydronephrosis   . HYPERLIPIDEMIA    Not able to take statins.  [name] HYPERTENSION   . IDA (iron deficiency anemia)   . Kidney stone 2015  . Peripheral vascular disease, unspecified ([place])   . Persistent mood disorder ([place])   . Presence of intraocular lens   . Toe deformity   . Vitamin B 12 deficiency   . Vitamin D deficiency     Past Surgical History:  Procedure Laterality Date  . CARDIAC CATHETERIZATION N/A 10/09/2015   Procedure: Left Heart Cath and Cors/Grafts Angiography;  Surgeon: Jettie Booze, MD;  Location: [place] [facility];  Service: Cardiovascular;  Laterality: N/A;  . CORONARY ANGIOPLASTY WITH STENT PLACEMENT  01/09/2006   Three-vessel CAD  His right coronary artery is extremely tight.  He has moderate to severe disease in the LAD & left circumflex artery.  The LAD is very heavily calcified and the proximal and ostial LAD is not a good target for PTCA.  We will refer him to CVTS for further evaluation  . CORONARY ANGIOPLASTY WITH STENT PLACEMENT  02/14/2000   EF - of around 65-70%./PTCA and stenting of his mid right  coronary artery (4.0 x 18 mm NIR stent to the mid RCA).  He was noted  to have a 50% ostial stenosis at the time of his heart catheterization   . CORONARY ARTERY BYPASS GRAFT  01/17/2006   x3 using a left internal mammary artery graft to left anterior descending coronary artery, saphenous vein graft to obtuse marginal branch, saphenous vein graft to posterior descending branch / Endoscopic vein  harvesting from the right leg.  [name] GASTROCNEMIUS RECESSION  03/11/2012   Procedure: GASTROCNEMIUS SLIDE;  Surgeon: Wylene Simmer, MD;  Location: [place];  Service: Orthopedics;  Laterality: Right;  . HIP FRACTURE SURGERY     right  . TENDON REPAIR  03/11/2012   Procedure: TENDON REPAIR;  Surgeon: Wylene Simmer, MD;  Location: [place];  Service: Orthopedics;  Laterality: Right;  Right Tibialis Tendon Repair With Gastrocnemius Recession; Possible PLANTARIS AUTOGRAFT   . TONSILLECTOMY    . TONSILLECTOMY    . VASECTOMY      Prior to Admission medications   Medication Sig Start Date End Date Taking? Authorizing Provider  Cholecalciferol 25 MCG (1000 UT) capsule Take 1,000 capsules by mouth daily.   Yes [provider]  finasteride (PROSCAR) 5 MG tablet Take 5 mg by mouth daily.   Yes [provider]  loperamide (IMODIUM) 2 MG capsule Take 2 mg by mouth daily as needed for diarrhea or loose stools.   Yes [provider]  memantine (NAMENDA) 10 MG tablet Take 10 mg by mouth 2 (two) times daily.   Yes [provider]  Multiple Vitamins-Minerals (PRESERVISION AREDS 2 PO) Take 1 capsule by mouth in the morning and at bedtime.   Yes [provider]  REPATHA SURECLICK 778 MG/ML SOAJ INJECT 1 SYRINGE EVERY 14 DAYS Patient taking differently: Inject 1 each into the skin See admin instructions. Every 14 days 11/01/19  Yes Nahser, Wonda Cheng, MD  sertraline (ZOLOFT) 25 MG tablet Take 25 mg by mouth daily.   Yes [provider]  sodium chloride (OCEAN) 0.65 % SOLN nasal spray Place 1 spray into both nostrils at bedtime.   Yes [provider]  tamsulosin (FLOMAX) 0.4 MG CAPS capsule Take 1 capsule by mouth daily.    Yes [provider]  Travoprost, BAK Free, (TRAVATAN) 0.004 % SOLN ophthalmic solution Place 1 drop into both eyes at bedtime.   Yes [provider]  nitroGLYCERIN (NITROSTAT) 0.4 MG SL tablet  DISSOLVE ONE TABLET UNDER THE TONGUE EVERY 5 MINUTES AS NEEDED FOR CHEST PAIN. Patient taking differently: Place 0.4 mg under the tongue every 5 (five) minutes as needed for chest pain. 06/16/19   Nahser, Wonda Cheng, MD    Current Facility-Administered Medications  Medication Dose Route Frequency Provider Last Rate Last Admin  . acetaminophen (TYLENOL) tablet 650 mg  650 mg Oral Q6H PRN Rise Patience, MD       Or  . acetaminophen (TYLENOL) suppository 650 mg  650 mg Rectal Q6H PRN Rise Patience, MD      . finasteride (PROSCAR) tablet 5 mg  5 mg Oral Daily Rise Patience, MD      . memantine [facility]) tablet 10 mg  10 mg Oral BID Rise Patience, MD      . sodium chloride (PF) 0.9 % injection           . tamsulosin (FLOMAX) capsule 0.4 mg  0.4 mg Oral QPC supper Rise Patience, MD       Current Outpatient Medications  Medication Sig Dispense Refill  . Cholecalciferol 25 MCG (1000 UT) capsule Take 1,000 capsules by mouth daily.    . finasteride (PROSCAR) 5 MG tablet Take 5 mg by mouth daily.    [name] loperamide (IMODIUM) 2 MG capsule Take 2 mg by mouth daily as needed for diarrhea or loose stools.    . memantine (NAMENDA) 10 MG tablet Take 10 mg by mouth 2 (two) times daily.    . Multiple Vitamins-Minerals (PRESERVISION AREDS 2 PO) Take 1 capsule by mouth in the morning and at bedtime.    [name] REPATHA SURECLICK 242 MG/ML SOAJ INJECT 1 SYRINGE EVERY 14 DAYS (Patient taking differently: Inject 1 each into the skin See admin instructions. Every 14 days) 2 mL 11  . sertraline (ZOLOFT) 25 MG tablet Take 25 mg by mouth daily.    . sodium chloride (OCEAN) 0.65 % SOLN nasal spray Place 1 spray into both nostrils at bedtime.    . tamsulosin (FLOMAX) 0.4 MG CAPS capsule Take 1 capsule by mouth daily.     . Travoprost, BAK Free, (TRAVATAN) 0.004 % SOLN ophthalmic solution Place 1 drop into both eyes at bedtime.    . nitroGLYCERIN (NITROSTAT) 0.4 MG SL tablet DISSOLVE ONE TABLET  UNDER THE TONGUE EVERY 5  MINUTES AS NEEDED FOR CHEST PAIN. (Patient taking differently: Place 0.4 mg under the tongue every 5 (five) minutes as needed for chest pain.) 25 tablet 5    Allergies as of 02/07/2020 - Review Complete 02/07/2020  Allergen Reaction Noted  . Cozaar  04/28/2011  . Ace inhibitors Itching 03/11/2012  . Crestor [rosuvastatin calcium]  07/04/2010  . Lipitor [atorvastatin calcium]  07/04/2010  . Lisinopril  06/12/2009  . Lovastatin  07/04/2010  . Pravastatin  07/04/2010  . Statins  12/01/2012  . Zetia [ezetimibe] Diarrhea 08/07/2015  . Zocor [simvastatin - high dose]  07/04/2010  . Latex Rash 03/11/2012    Family History  Problem Relation Age of Onset  . Heart attack Father   . Stroke Mother     Social History   Socioeconomic History  . Marital status: Married    Spouse name: Not on file  . Number of children: Not on file  . Years of education: Not on file  . Highest education level: Not on file  Occupational History  . Not on file  Tobacco Use  . Smoking status: Former Smoker    Types: Cigarettes    Quit date: 02/24/1965    Years since quitting: 54.9  . Smokeless tobacco: Never Used  Substance and Sexual Activity  . Alcohol use: No  . Drug use: No  . Sexual activity: Not on file  Other Topics Concern  . Not on file  Social History Narrative  . Not on file   Social Determinants of Health   Financial Resource Strain: Not on file  Food Insecurity: Not on file  Transportation Needs: Not on file  Physical Activity: Not on file  Stress: Not on file  Social Connections: Not on file  Intimate Partner Violence: Not on file    Review of Systems: Gen: Denies fever, sweats or chills. No weight loss.  CV: Denies chest pain, palpitations or edema. Resp: Denies cough, shortness of breath of hemoptysis.  GI: See HPI. GU : Denies urinary burning, blood in urine, increased urinary frequency or incontinence. MS: Denies joint pain, muscles aches or  weakness. Derm: Denies rash, itchiness, skin lesions or unhealing ulcers. Psych: + dementia.  Heme: Denies easy bruising, bleeding. Neuro:  Denies headaches, dizziness or paresthesias. Endo:  + DM.  Physical Exam: Vital signs in last 24 hours: Temp:  [98.6 F (37 C)] 98.6 F (37 C) (12/14 1751) Pulse Rate:  [63-79] 65 (12/15 0615) Resp:  [10-24] 10 (12/15 0615) BP: (113-150)/(63-80) 140/74 (12/15 0615) SpO2:  [94 %-99 %] 97 % (12/15 0615)   General:  Alert elderly 86 year old male in no acute distress. Head:  Normocephalic and atraumatic. Eyes:  No scleral icterus. Conjunctiva pink. Ears:  Normal auditory acuity. Nose:  No deformity, discharge or lesions. Mouth:  Dentition intact. No ulcers or lesions.  Neck:  Supple. No lymphadenopathy or thyromegaly.  Lungs: Breath sounds clear throughout. Heart: Regular rate and rhythm, no murmurs. Abdomen: Soft, mild gaseous distention.  Nontender.  Positive bowel sounds to all 4 quadrants.  No HSM. Rectal: Mildly inflamed right anal hemorrhoid without prolapse.  No fissure.  No stool or blood in the rectal vault.  Prostate is enlarged.  No mass. Musculoskeletal:  Symmetrical without gross deformities.  Pulses:  Normal pulses noted. Extremities:  Without clubbing or edema. Neurologic:  Alert and  oriented x 3. No focal deficits.  Skin:  Intact without significant lesions or rashes. Psych:  Alert and cooperative. Normal mood and affect.  Intake/Output from  previous day: 12/14 0701 - 12/15 0700 In: 1000 [IV Piggyback:1000] Out: -  Intake/Output this shift: No intake/output data recorded.  Lab Results: Recent Labs    02/06/20 1156 02/07/20 1808 02/08/20 0548  WBC 12.2* 13.7* 11.8*  HGB 12.7* 13.0 11.7*  HCT 39.0 40.0 35.8*  PLT 437* 423* 331   BMET Recent Labs    02/06/20 1156 02/07/20 1808  NA 134* 132*  K 4.6 3.8  CL 101 102  CO2 22 21*  GLUCOSE 132* 120*  BUN 14 17  CREATININE 1.12 1.05  CALCIUM 9.0 8.9    LFT Recent Labs    02/07/20 1808  PROT 6.3*  ALBUMIN 3.8  AST 20  ALT 15  ALKPHOS 67  BILITOT 0.6   PT/INR No results for input(s): LABPROT, INR in the last 72 hours. Hepatitis Panel No results for input(s): HEPBSAG, HCVAB, HEPAIGM, HEPBIGM in the last 72 hours.    Studies/Results: CT ABDOMEN PELVIS W CONTRAST  Result Date: 02/08/2020 CLINICAL DATA:  Unintended weight loss, diarrhea, hematochezia EXAM: CT ABDOMEN AND PELVIS WITH CONTRAST TECHNIQUE: Multidetector CT imaging of the abdomen and pelvis was performed using the standard protocol following bolus administration of intravenous contrast. CONTRAST:  164mL OMNIPAQUE IOHEXOL 300 MG/ML  SOLN COMPARISON:  07/01/2013 FINDINGS: Lower chest: Mild bibasilar bronchiectasis and minimal fibrosis, left greater than right best appreciated at the costophrenic angles. No superimposed focal pulmonary infiltrate. Coronary artery bypass grafting has been performed. Global cardiac size within normal limits. Small hiatal hernia. Hepatobiliary: Mild hepatic steatosis. No enhancing liver lesion. No intra or extrahepatic biliary ductal dilation. Gallbladder unremarkable. Pancreas: Unremarkable Spleen: Unremarkable Adrenals/Urinary Tract: The adrenal glands are unremarkable. The kidneys are normal in position. Mild bilateral renal cortical atrophy left slightly greater than right. 4 mm in nonobstructing calculus noted within the lower pole of the left kidney. No ureteral calculi. No hydronephrosis. No enhancing renal masses. Marked central prostatic hypertrophy indents and protrudes into the base of the bladder. The bladder is not distended, however. Tiny bladder diverticulum is identified suggesting changes of at least mild bladder outlet obstruction. Stomach/Bowel: Stomach is within normal limits. Appendix appears normal. No evidence of bowel wall thickening, distention, or inflammatory changes. No free intraperitoneal gas or fluid. Vascular/Lymphatic:  Extensive aortoiliac atherosclerotic calcification. No aortic aneurysm. Particularly prominent atherosclerotic calcification is seen at the renal ostia bilaterally as well as the mesenteric arterial vasculature, however, the degree of stenosis is not well assessed on this non arteriographic study. No pathologic adenopathy within the abdomen and pelvis. Reproductive: As noted above, the prostate gland is markedly hypertrophied centrally, protruding into the bladder lumen. Seminal vesicles are unremarkable. Other: Rectum unremarkable.  No abdominal wall hernia identified. Musculoskeletal: Right hip pinning has been performed. Degenerative changes are seen within the lumbar spine. No acute bone abnormality. IMPRESSION: No definite radiographic explanation for the patient's reported weight loss. Peripheral vascular disease, however, is noted and, while similar to prior examination, the degree of stenosis involving the mesenteric and renal arterial vasculature is not well assessed on this exam. If there is clinical evidence of chronic mesenteric ischemia, CT arteriography may be more helpful to better assess the degree of stenosis. Mild hepatic steatosis. Mild nonobstructing left nephrolithiasis. Marked central prostatic hypertrophy, which protrudes into the bladder lumen, with at least mild bladder outlet obstruction. The bladder is not distended at this time. Aortic Atherosclerosis (ICD10-I70.0). Electronically Signed   By: Fidela Salisbury MD   On: 02/08/2020 03:43    IMPRESSION/PLAN:  26.  86 year old  male with chronic diarrhea with new onset rectal bleeding.  CTAP without evidence of colitis.  WBC 13.7  -> 11.8.  He is afebrile.  C. difficile and GI pathogen panel ordered but not yet collected. No further diarrhea or hematochezia since arriving to the ED. -Diagnostic colonoscopy to be considered after C. difficile and GI pathogen stool results received -Clear liquid diet -Continue IV fluids -Anusol HC 25 mg  suppository 1 PR nightly x3 nights -Repeat CBC, CMP in am -Further recommendations per Dr. Silverio Decamp  2.  History of coronary artery disease  3.  History of dementia  4. DM II   Noralyn Pick  02/08/2020, 09:50 AM   Attending physician's note   I have taken a history, examined the patient and reviewed the chart. I agree with the Advanced Practitioner's note, impression and recommendations.  86 year old male with history of CAD s/p CABG, peripheral vascular disease, type 2 diabetes admitted with diarrhea and small-volume rectal bleeding  He was treated with azithromycin for positive Campylobacter in September 2021.  He has been having chronic diarrhea for several months now  No significant drop in hemoglobin He is hemodynamically stable  CT abdomen pelvis showed no acute pathology.  No bowel wall thickening or findings to suggest colitis  Small-volume rectal bleeding could be secondary to hemorrhage from internal hemorrhoids  Check follow-up GI pathogen panel/C. difficile to exclude infectious etiology  Anusol cream per rectum  If GI pathogen panel negative, will do a trial of cholestyramine 4 g twice daily for possible bile salt induced diarrhea.  If persistent symptoms may consider flexible sigmoidoscopy with biopsies to exclude microscopic colitis  Soft low residue diet   The patient was provided an opportunity to ask questions and all were answered. The patient agreed with the plan and demonstrated an understanding of the instructions.  Damaris Hippo , MD [phone]

## 2020-02-08 NOTE — ED Provider Notes (Signed)
[facility] DEPT Provider Note   CSN: [id number] Arrival date & time: 02/07/20  1732   History Chief Complaint  Patient presents with   Diarrhea   Rectal Bleeding    Johnny [name]. is a 86 y.o. male.  The history is provided by the patient.  Diarrhea Rectal Bleeding He has history of hypertension, diabetes, hyperlipidemia, coronary artery disease, peripheral vascular disease and comes in because of ongoing problems with diarrhea with blood.  Diarrhea has been a problem for at least 4 months and is getting worse.  He has had 6 bowel movements a day.  Blood is mainly when he wipes following a bowel movement although there is sometimes some blood next with the diarrhea.  Diarrhea is liquid and mixes with the water.  There is no floating of stool.  There has not been any nausea or vomiting.  Wife thinks there has been some weight loss but she is not sure how much.  He has not had any known fever or chills but he has been complaining of being consistently cold.  He has been taking loperamide without any improvement in diarrhea.  He did have an evaluation by his gastroenterologist 3 months ago who did stool studies which were reported to have been negative.  He was in the emergency department yesterday at which time it was felt that his bleeding was from hemorrhoids.  Bleeding and diarrhea are worse today.  She had called his gastroenterologist who advised him to come to the ED to be admitted for further work-up.  Past Medical History:  Diagnosis Date   Aortic atherosclerosis ([place])    Atherosclerotic heart disease    BPH (benign prostatic hyperplasia)    Calculus of kidney    Calculus of ureter    CORONARY ARTERY DISEASE Nov 2007   CABG x3    DIABETES MELLITUS, TYPE II    Dry skin dermatitis    Glaucoma    Hammer toe    Hip fracture, right (HCC)    History of kidney stones    Hydronephrosis    HYPERLIPIDEMIA    Not able to take  statins.   HYPERTENSION    IDA (iron deficiency anemia)    Kidney stone 2015   Peripheral vascular disease, unspecified (HCC)    Persistent mood disorder (HCC)    Presence of intraocular lens    Toe deformity    Vitamin B 12 deficiency    Vitamin D deficiency     Patient Active Problem List   Diagnosis Date Noted   Fatigue 06/16/2019   Coronary artery disease involving native coronary artery of native heart without angina pectoris 10/16/2016   Abnormal nuclear stress test    Exertional dyspnea-? anginal equivalent 10/08/2015   Elevated troponin 10/07/2015   NSTEMI (non-ST elevated myocardial infarction) ([place]) 10/07/2015   Essential hypertension    Weight loss 02/01/2013   Diabetes mellitus without complication ([place]) 40/98/1191   Hyperlipidemia 07/17/2006   Coronary atherosclerosis 07/17/2006    Past Surgical History:  Procedure Laterality Date   CARDIAC CATHETERIZATION N/A 10/09/2015   Procedure: Left Heart Cath and Cors/Grafts Angiography;  Surgeon: Jettie Booze, MD;  Location: [facility];  Service: Cardiovascular;  Laterality: N/A;   CORONARY ANGIOPLASTY WITH STENT PLACEMENT  01/09/2006   Three-vessel CAD  His right coronary artery is extremely tight.  He has moderate to severe disease in the LAD & left circumflex artery.  The LAD is very heavily calcified and the  proximal and ostial LAD is not a good target for PTCA.  We will refer him to CVTS for further evaluation   CORONARY ANGIOPLASTY WITH STENT PLACEMENT  02/14/2000   EF - of around 65-70%./PTCA and stenting of his mid right  coronary artery (4.0 x 18 mm NIR stent to the mid RCA).  He was noted  to have a 50% ostial stenosis at the time of his heart catheterization    CORONARY ARTERY BYPASS GRAFT  01/17/2006   x3 using a left internal mammary artery graft to left anterior descending coronary artery, saphenous vein graft to obtuse marginal branch, saphenous vein graft to posterior  descending branch / Endoscopic vein harvesting from the right leg.   GASTROCNEMIUS RECESSION  03/11/2012   Procedure: GASTROCNEMIUS SLIDE;  Surgeon: Wylene Simmer, MD;  Location: [facility];  Service: Orthopedics;  Laterality: Right;   HIP FRACTURE SURGERY     right   TENDON REPAIR  03/11/2012   Procedure: TENDON REPAIR;  Surgeon: Wylene Simmer, MD;  Location: [facility];  Service: Orthopedics;  Laterality: Right;  Right Tibialis Tendon Repair With Gastrocnemius Recession; Possible PLANTARIS AUTOGRAFT    TONSILLECTOMY     TONSILLECTOMY     VASECTOMY         Family History  Problem Relation Age of Onset   Heart attack Father    Stroke Mother     Social History   Tobacco Use   Smoking status: Former Smoker    Types: Cigarettes    Quit date: 02/24/1965    Years since quitting: 54.9   Smokeless tobacco: Never Used  Substance Use Topics   Alcohol use: No   Drug use: No    Home Medications Prior to Admission medications   Medication Sig Start Date End Date Taking? Authorizing Provider  Cholecalciferol 25 MCG (1000 UT) capsule Take 1,000 capsules by mouth daily.    [provider]  finasteride (PROSCAR) 5 MG tablet Take 5 mg by mouth daily.    [provider]  loperamide (IMODIUM) 2 MG capsule Take 2 mg by mouth daily as needed for diarrhea or loose stools.    [provider]  memantine (NAMENDA) 10 MG tablet Take 10 mg by mouth 2 (two) times daily.    [provider]  Multiple Vitamins-Minerals (PRESERVISION AREDS 2 PO) Take 1 capsule by mouth in the morning and at bedtime.    [provider]  nitroGLYCERIN (NITROSTAT) 0.4 MG SL tablet DISSOLVE ONE TABLET UNDER THE TONGUE EVERY 5 MINUTES AS NEEDED FOR CHEST PAIN. Patient taking differently: Place 0.4 mg under the tongue every 5 (five) minutes as needed for chest pain. 06/16/19   Nahser, Wonda Cheng, MD  REPATHA SURECLICK 300 MG/ML SOAJ INJECT 1 SYRINGE  EVERY 14 DAYS Patient taking differently: Inject 1 each into the skin See admin instructions. Every 14 days 11/01/19   Nahser, Wonda Cheng, MD  sertraline (ZOLOFT) 25 MG tablet Take 25 mg by mouth daily.    [provider]  sodium chloride (OCEAN) 0.65 % SOLN nasal spray Place 1 spray into both nostrils at bedtime.    [provider]  tamsulosin (FLOMAX) 0.4 MG CAPS capsule Take 1 capsule by mouth daily.     [provider]  Travoprost, BAK Free, (TRAVATAN) 0.004 % SOLN ophthalmic solution Place 1 drop into both eyes at bedtime.    [provider]    Allergies    Cozaar, Ace inhibitors, Crestor [rosuvastatin calcium], Lipitor [  atorvastatin calcium], Lisinopril, Lovastatin, Pravastatin, Statins, Zetia [ezetimibe], Zocor [simvastatin - high dose], and Latex  Review of Systems   Review of Systems  Gastrointestinal: Positive for diarrhea and hematochezia.  All other systems reviewed and are negative.   Physical Exam Updated Vital Signs BP 138/69    Pulse 67    Temp 98.6 F (37 C) (Oral)    Resp (!) 24    SpO2 99%   Physical Exam Vitals and nursing note reviewed.   86 year old male, resting comfortably and in no acute distress. Vital signs are significant for mildly elevated respiratory rate. Oxygen saturation is 99%, which is normal. Head is normocephalic and atraumatic. PERRLA, EOMI. Oropharynx is clear. Neck is nontender and supple without adenopathy or JVD. Back is nontender and there is no CVA tenderness. Lungs are clear without rales, wheezes, or rhonchi. Chest is nontender. Heart has regular rate and rhythm without murmur. Abdomen is soft, flat, nontender without masses or hepatosplenomegaly and peristalsis is hypoactive. Rectal: Normal sphincter tone.  Small external hemorrhoids present without any evidence of bleeding. Extremities have no cyanosis or edema, full range of motion is present. Skin is warm and dry without rash. Neurologic: Awake and  alert and able to answer questions and follow commands, cranial nerves are intact, there are no motor or sensory deficits.  ED Results / Procedures / Treatments   Labs (all labs ordered are listed, but only abnormal results are displayed) Labs Reviewed  COMPREHENSIVE METABOLIC PANEL - Abnormal; Notable for the following components:      Result Value   Sodium 132 (*)    CO2 21 (*)    Glucose, Bld 120 (*)    Total Protein 6.3 (*)    All other components within normal limits  CBC - Abnormal; Notable for the following components:   WBC 13.7 (*)    Platelets 423 (*)    All other components within normal limits  LIPASE, BLOOD  URINALYSIS, ROUTINE W REFLEX MICROSCOPIC  POC OCCULT BLOOD, ED  TYPE AND SCREEN    EKG None  Radiology No results found.  Procedures Procedures  Medications Ordered in ED Medications - No data to display  ED Course  I have reviewed the triage vital signs and the nursing notes.  Pertinent labs & imaging results that were available during my care of the patient were reviewed by me and considered in my medical decision making (see chart for details).  MDM Rules/Calculators/A&P Persistent diarrhea.  Patient does not appear clinically dehydrated but certainly that is a concern.  Wife does state that he has not been eating well at home.  Old records are reviewed confirming patient GI consult 3 months ago at which time stool ova and parasite and C. difficile were negative.  He had negative stool Hemoccult when during an ED visit yesterday.  Since yesterday, hemoglobin is actually increased slightly.  CBC and metabolic panel are significant only for mild thrombocytosis and mild leukocytosis.  I cannot find any evidence of recent thyroid studies I will check TSH.  He will be sent for CT of abdomen and pelvis.  CT shows no acute process although significant atherosclerotic disease is noted.  TSH has come back slightly elevated and will order T4 and T3 levels.  Case  is discussed with Dr. Hal Hope of [facility], who agrees to admit the patient.  Final Clinical Impression(s) / ED Diagnoses Final diagnoses:  Intractable diarrhea  Hyponatremia  Thrombocytosis  Hypothyroidism, unspecified type    Rx /  DC Orders ED Discharge Orders    None       Delora Fuel, MD 83/33/83 [phone]

## 2020-02-08 NOTE — ED Notes (Signed)
Condom catheter has been placed.

## 2020-02-09 DIAGNOSIS — K625 Hemorrhage of anus and rectum: Secondary | ICD-10-CM

## 2020-02-09 DIAGNOSIS — R197 Diarrhea, unspecified: Secondary | ICD-10-CM | POA: Diagnosis not present

## 2020-02-09 LAB — GASTROINTESTINAL PANEL BY PCR, STOOL (REPLACES STOOL CULTURE)

## 2020-02-09 LAB — CBC
HCT: 38.2 % — ABNORMAL LOW (ref 39.0–52.0)
Hemoglobin: 12.9 g/dL — ABNORMAL LOW (ref 13.0–17.0)
MCH: 30.7 pg (ref 26.0–34.0)
MCHC: 33.8 g/dL (ref 30.0–36.0)
MCV: 91 fL (ref 80.0–100.0)
Platelets: 336 10*3/uL (ref 150–400)
RBC: 4.2 MIL/uL — ABNORMAL LOW (ref 4.22–5.81)
RDW: 13.2 % (ref 11.5–15.5)
WBC: 9.7 10*3/uL (ref 4.0–10.5)
nRBC: 0 % (ref 0.0–0.2)

## 2020-02-09 LAB — T3, FREE: T3, Free: 2.5 pg/mL (ref 2.0–4.4)

## 2020-02-09 LAB — GLUCOSE, CAPILLARY
Glucose-Capillary: 109 mg/dL — ABNORMAL HIGH (ref 70–99)
Glucose-Capillary: 153 mg/dL — ABNORMAL HIGH (ref 70–99)
Glucose-Capillary: 96 mg/dL (ref 70–99)
Glucose-Capillary: 99 mg/dL (ref 70–99)

## 2020-02-09 LAB — BASIC METABOLIC PANEL
Anion gap: 8 (ref 5–15)
BUN: 11 mg/dL (ref 8–23)
CO2: 26 mmol/L (ref 22–32)
Calcium: 8.7 mg/dL — ABNORMAL LOW (ref 8.9–10.3)
Chloride: 103 mmol/L (ref 98–111)
Creatinine, Ser: 1.06 mg/dL (ref 0.61–1.24)
GFR, Estimated: >60 mL/min (ref >60)
Glucose, Bld: 120 mg/dL — ABNORMAL HIGH (ref 70–99)
Potassium: 4.2 mmol/L (ref 3.5–5.1)
Sodium: 137 mmol/L (ref 135–145)

## 2020-02-09 MED ORDER — HYDROCORTISONE ACETATE 25 MG RE SUPP: 25.0000 mg | Freq: Every day | RECTAL | 0 refills | Status: AC

## 2020-02-09 MED ORDER — CHOLECALCIFEROL 25 MCG (1000 UT) PO CAPS: 1000.0000 [IU] | ORAL_CAPSULE | Freq: Every day | ORAL | 1 refills | Status: AC

## 2020-02-09 MED ORDER — CHOLESTYRAMINE 4 G PO PACK: 1.0000 | PACK | Freq: Two times a day (BID) | ORAL | 11 refills | Status: DC

## 2020-02-09 NOTE — Discharge Summary (Signed)
Physician Discharge Summary  Johnny [name]. WPV:[id number] DOB: [birth year] DOA: 02/08/2020  PCP: Reeves Dam, MD  Admit date: 02/08/2020 Discharge date: 02/09/2020  Admitted From: Home Disposition: Home  Recommendations for Outpatient Follow-up:  1. Follow up with PCP in 1-2 weeks 2. Follow-up with gastroenterology 3. Please obtain BMP/CBC in one week 4. Please follow up on the following pending results: None  Home Health: No Equipment/Devices: Rolling walker Discharge Condition: Stable CODE STATUS: DNR Diet recommendation: Heart Healthy / Carb Modified   Brief/Interim Summary: Johnny Morrisonis a 86 y.o.malewith a past medical history of hypertension, coronary artery diseasewith DES x 2 2001 and 2007s/p CABG2007,hyperlipidemia, peripheral vascular disease, diabetes mellitus type 2, kidney stonesand dementia.  He was initially seen in our office by Dr. Loletha Carrow 11/16/2019 for further evaluation regarding diarrhea which at that point he had for several months. He apparently was seen by the GI at the [facility] and stool studies were positive for lactoferrin and Campylobacter. He was treated with azithromycin. There is also fat in the stool. His diarrhea improved but he continued to pass 3-4 soft dark stools daily. C. difficile testing was negative. A colonoscopy was discussed if his symptoms persisted. Underwent a screening colonoscopy by Dr. Sharlett Iles December 2008 which showed internal hemorrhoids, no polyps. A prior colonoscopy possibly at the age of 86 was reported as normal. Came to ED with complaint of persistent diarrhea and some rectal bleeding. Likely from his internal hemorrhoids.  Hemoglobin remained stable.  Testing for C. difficile and GI pathogen was negative.  CT abdomen without any significant abnormality.  Diarrhea improved during hospitalization.  He had a small amount of loose bowel movement on the day of discharge. GI was consulted and  they are recommending cholestyramine 4 mg twice daily to see if that will help with his diarrhea.  He will follow-up with GI as an outpatient for further recommendations.  He will continue with rest of his home meds and follow-up with his providers.  Discharge Diagnoses:  Principal Problem:   Rectal bleeding Active Problems:   Diabetes mellitus without complication (HCC)   Hyperlipidemia   Coronary atherosclerosis   Intractable diarrhea   Discharge Instructions  Discharge Instructions    Diet - low sodium heart healthy   Complete by: As directed    Discharge instructions   Complete by: As directed    It was pleasure taking care of you. You are being given a prescription for cholestyramine packet which you can take it twice daily if your diarrhea persist. Please follow-up with your gastroenterologist according to your appointment. Keep yourself well-hydrated   Increase activity slowly   Complete by: As directed    No wound care   Complete by: As directed      Allergies as of 02/09/2020      Reactions   Cozaar    Ace Inhibitors Itching   Ache, itching.[name]   Crestor [rosuvastatin Calcium]    Muscle aches   Lipitor [atorvastatin Calcium]    Muscle aches   Lisinopril    REACTION: rash ?   Lovastatin    Muscle aches   Pravastatin    Muscle aches   Statins    Other reaction(s): Malaise (intolerance)   Zetia [ezetimibe] Diarrhea   Zocor [simvastatin - High Dose]    Muscle aches   Latex Rash      Medication List    TAKE these medications   Cholecalciferol 25 MCG (1000 UT) capsule Take 1 capsule (  1,000 Units total) by mouth daily. What changed: how much to take   cholestyramine 4 g packet Commonly known as: Questran Take 1 packet by mouth 2 (two) times daily.   finasteride 5 MG tablet Commonly known as: PROSCAR Take 5 mg by mouth daily.   hydrocortisone 25 MG suppository Commonly known as: ANUSOL-HC Place 1 suppository (25 mg total) rectally at bedtime.    loperamide 2 MG capsule Commonly known as: IMODIUM Take 2 mg by mouth daily as needed for diarrhea or loose stools.   memantine 10 MG tablet Commonly known as: NAMENDA Take 10 mg by mouth 2 (two) times daily.   nitroGLYCERIN 0.4 MG SL tablet Commonly known as: NITROSTAT DISSOLVE ONE TABLET UNDER THE TONGUE EVERY 5 MINUTES AS NEEDED FOR CHEST PAIN. What changed:   how much to take  how to take this  when to take this  reasons to take this  additional instructions   PRESERVISION AREDS 2 PO Take 1 capsule by mouth in the morning and at bedtime.   Repatha SureClick 353 MG/ML Soaj Generic drug: Evolocumab INJECT 1 SYRINGE EVERY 14 DAYS What changed: See the new instructions.   sertraline 25 MG tablet Commonly known as: ZOLOFT Take 25 mg by mouth daily.   sodium chloride 0.65 % Soln nasal spray Commonly known as: OCEAN Place 1 spray into both nostrils at bedtime.   tamsulosin 0.4 MG Caps capsule Commonly known as: FLOMAX Take 1 capsule by mouth daily.   Travoprost (BAK Free) 0.004 % Soln ophthalmic solution Commonly known as: TRAVATAN Place 1 drop into both eyes at bedtime.       Follow-up Information    Reeves Dam, MD. Schedule an appointment as soon as possible for a visit in 1 week(s).   Specialty: Internal Medicine       Nahser, Wonda Cheng, MD .   Specialty: Cardiology Contact information: [address] [place] [address] [phone]              Allergies  Allergen Reactions  . Cozaar   . Ace Inhibitors Itching    Ache, itching..  . Crestor [Rosuvastatin Calcium]     Muscle aches  . Lipitor [Atorvastatin Calcium]     Muscle aches  . Lisinopril     REACTION: rash ?  . Lovastatin     Muscle aches  . Pravastatin     Muscle aches  . Statins     Other reaction(s): Malaise (intolerance)  . Zetia [Ezetimibe] Diarrhea  . Zocor [Simvastatin - High Dose]     Muscle aches  . Latex Rash     Consultations:  Gastroenterology  Procedures/Studies: CT ABDOMEN PELVIS W CONTRAST  Result Date: 02/08/2020 CLINICAL DATA:  Unintended weight loss, diarrhea, hematochezia EXAM: CT ABDOMEN AND PELVIS WITH CONTRAST TECHNIQUE: Multidetector CT imaging of the abdomen and pelvis was performed using the standard protocol following bolus administration of intravenous contrast. CONTRAST:  155mL OMNIPAQUE IOHEXOL 300 MG/ML  SOLN COMPARISON:  07/01/2013 FINDINGS: Lower chest: Mild bibasilar bronchiectasis and minimal fibrosis, left greater than right best appreciated at the costophrenic angles. No superimposed focal pulmonary infiltrate. Coronary artery bypass grafting has been performed. Global cardiac size within normal limits. Small hiatal hernia. Hepatobiliary: Mild hepatic steatosis. No enhancing liver lesion. No intra or extrahepatic biliary ductal dilation. Gallbladder unremarkable. Pancreas: Unremarkable Spleen: Unremarkable Adrenals/Urinary Tract: The adrenal glands are unremarkable. The kidneys are normal in position. Mild bilateral renal cortical atrophy left slightly greater than right. 4  mm in nonobstructing calculus noted within the lower pole of the left kidney. No ureteral calculi. No hydronephrosis. No enhancing renal masses. Marked central prostatic hypertrophy indents and protrudes into the base of the bladder. The bladder is not distended, however. Tiny bladder diverticulum is identified suggesting changes of at least mild bladder outlet obstruction. Stomach/Bowel: Stomach is within normal limits. Appendix appears normal. No evidence of bowel wall thickening, distention, or inflammatory changes. No free intraperitoneal gas or fluid. Vascular/Lymphatic: Extensive aortoiliac atherosclerotic calcification. No aortic aneurysm. Particularly prominent atherosclerotic calcification is seen at the renal ostia bilaterally as well as the mesenteric arterial vasculature, however, the degree of stenosis  is not well assessed on this non arteriographic study. No pathologic adenopathy within the abdomen and pelvis. Reproductive: As noted above, the prostate gland is markedly hypertrophied centrally, protruding into the bladder lumen. Seminal vesicles are unremarkable. Other: Rectum unremarkable.  No abdominal wall hernia identified. Musculoskeletal: Right hip pinning has been performed. Degenerative changes are seen within the lumbar spine. No acute bone abnormality. IMPRESSION: No definite radiographic explanation for the patient's reported weight loss. Peripheral vascular disease, however, is noted and, while similar to prior examination, the degree of stenosis involving the mesenteric and renal arterial vasculature is not well assessed on this exam. If there is clinical evidence of chronic mesenteric ischemia, CT arteriography may be more helpful to better assess the degree of stenosis. Mild hepatic steatosis. Mild nonobstructing left nephrolithiasis. Marked central prostatic hypertrophy, which protrudes into the bladder lumen, with at least mild bladder outlet obstruction. The bladder is not distended at this time. Aortic Atherosclerosis (ICD10-I70.0). Electronically Signed   By: Fidela Salisbury MD   On: 02/08/2020 03:43     Subjective: Patient had no new complaint when seen today.  Wife at bedside.  Wants to go home.  Did not had any more rectal bleed since in the hospital.  Had a small loose bowel movement this morning.  Discharge Exam: Vitals:   02/09/20 0517 02/09/20 0939  BP: 133/67 138/62  Pulse: 63 70  Resp: 16 17  Temp: 98.1 F (36.7 C) 98.6 F (37 C)  SpO2: 95% 94%   Vitals:   02/08/20 2112 02/09/20 0151 02/09/20 0517 02/09/20 0939  BP: (!) 151/78 127/69 133/67 138/62  Pulse: 72 69 63 70  Resp: 16 18 16 17   Temp: 98.1 F (36.7 C) 98.6 F (37 C) 98.1 F (36.7 C) 98.6 F (37 C)  TempSrc: Oral   Oral  SpO2: 94% 94% 95% 94%    General: Pt is alert, awake, not in acute  distress Cardiovascular: RRR, S1/S2 +, no rubs, no gallops Respiratory: CTA bilaterally, no wheezing, no rhonchi Abdominal: Soft, NT, ND, bowel sounds + Extremities: no edema, no cyanosis   The results of significant diagnostics from this hospitalization (including imaging, microbiology, ancillary and laboratory) are listed below for reference.    Microbiology: Recent Results (from the past 240 hour(s))  Resp Panel by RT-PCR (Flu A&B, Covid) Nasopharyngeal Swab     Status: None   Collection Time: 02/08/20  1:33 AM   Specimen: Nasopharyngeal Swab; Nasopharyngeal(NP) swabs in vial transport medium  Result Value Ref Range Status   SARS Coronavirus 2 by RT PCR NEGATIVE NEGATIVE Final    Comment: (NOTE) SARS-CoV-2 target nucleic acids are NOT DETECTED.  The SARS-CoV-2 RNA is generally detectable in upper respiratory specimens during the acute phase of infection. The lowest concentration of SARS-CoV-2 viral copies this assay can detect is 138 copies/mL. A negative result  does not preclude SARS-Cov-2 infection and should not be used as the sole basis for treatment or other patient management decisions. A negative result may occur with  improper specimen collection/handling, submission of specimen other than nasopharyngeal swab, presence of viral mutation(s) within the areas targeted by this assay, and inadequate number of viral copies(<138 copies/mL). A negative result must be combined with clinical observations, patient history, and epidemiological information. The expected result is Negative.  Fact Sheet for Patients:  EntrepreneurPulse.com.au  Fact Sheet for Healthcare Providers:  IncredibleEmployment.be  This test is no t yet approved or cleared by the [place] FDA and  has been authorized for detection and/or diagnosis of SARS-CoV-2 by FDA under an Emergency Use Authorization (EUA). This EUA will remain  in effect (meaning this test can  be used) for the duration of the COVID-19 declaration under Section 564(b)(1) of the Act, 21 U.S.C.section 360bbb-3(b)(1), unless the authorization is terminated  or revoked sooner.       Influenza A by PCR NEGATIVE NEGATIVE Final   Influenza B by PCR NEGATIVE NEGATIVE Final    Comment: (NOTE) The Xpert Xpress SARS-CoV-2/FLU/RSV plus assay is intended as an aid in the diagnosis of influenza from Nasopharyngeal swab specimens and should not be used as a sole basis for treatment. Nasal washings and aspirates are unacceptable for Xpert Xpress SARS-CoV-2/FLU/RSV testing.  Fact Sheet for Patients: EntrepreneurPulse.com.au  Fact Sheet for Healthcare Providers: IncredibleEmployment.be  This test is not yet approved or cleared by the [place] FDA and has been authorized for detection and/or diagnosis of SARS-CoV-2 by FDA under an Emergency Use Authorization (EUA). This EUA will remain in effect (meaning this test can be used) for the duration of the COVID-19 declaration under Section 564(b)(1) of the Act, 21 U.S.C. section 360bbb-3(b)(1), unless the authorization is terminated or revoked.  Performed at [facility], [place] [address], [place], [place] [address]   Gastrointestinal Panel by PCR , Stool     Status: None   Collection Time: 02/08/20  4:27 PM   Specimen: STOOL  Result Value Ref Range Status   Campylobacter species NOT DETECTED NOT DETECTED Final   Plesimonas shigelloides NOT DETECTED NOT DETECTED Final   Salmonella species NOT DETECTED NOT DETECTED Final   Yersinia enterocolitica NOT DETECTED NOT DETECTED Final   Vibrio species NOT DETECTED NOT DETECTED Final   Vibrio cholerae NOT DETECTED NOT DETECTED Final   Enteroaggregative E coli (EAEC) NOT DETECTED NOT DETECTED Final   Enteropathogenic E coli (EPEC) NOT DETECTED NOT DETECTED Final   Enterotoxigenic E coli (ETEC) NOT DETECTED NOT DETECTED Final   Shiga like  toxin producing E coli (STEC) NOT DETECTED NOT DETECTED Final   Shigella/Enteroinvasive E coli (EIEC) NOT DETECTED NOT DETECTED Final   Cryptosporidium NOT DETECTED NOT DETECTED Final   Cyclospora cayetanensis NOT DETECTED NOT DETECTED Final   Entamoeba histolytica NOT DETECTED NOT DETECTED Final   Giardia lamblia NOT DETECTED NOT DETECTED Final   Adenovirus F40/41 NOT DETECTED NOT DETECTED Final   Astrovirus NOT DETECTED NOT DETECTED Final   Norovirus GI/GII NOT DETECTED NOT DETECTED Final   Rotavirus A NOT DETECTED NOT DETECTED Final   Sapovirus (I, II, IV, and V) NOT DETECTED NOT DETECTED Final    Comment: Performed at [facility], [address] [place], [place] [address]     Labs: BNP (last 3 results) No results for input(s): BNP in the last 8760 hours. Basic Metabolic Panel: Recent Labs  Lab 02/06/20 1156 02/07/20  1808 02/09/20 0901  NA 134* 132* 137  K 4.6 3.8 4.2  CL 101 102 103  CO2 22 21* 26  GLUCOSE 132* 120* 120*  BUN 14 17 11   CREATININE 1.12 1.05 1.06  CALCIUM 9.0 8.9 8.7*   Liver Function Tests: Recent Labs  Lab 02/06/20 1156 02/07/20 1808  AST 20 20  ALT 14 15  ALKPHOS 64 67  BILITOT 0.7 0.6  PROT 6.5 6.3*  ALBUMIN 3.8 3.8   Recent Labs  Lab 02/06/20 1156 02/07/20 1808  LIPASE 23 28   No results for input(s): AMMONIA in the last 168 hours. CBC: Recent Labs  Lab 02/06/20 1156 02/07/20 1808 02/08/20 0548 02/08/20 1107 02/09/20 0901  WBC 12.2* 13.7* 11.8* 10.6* 9.7  HGB 12.7* 13.0 11.7* 12.3* 12.9*  HCT 39.0 40.0 35.8* 37.0* 38.2*  MCV 91.5 91.1 90.9 91.4 91.0  PLT 437* 423* 331 337 336   Cardiac Enzymes: No results for input(s): CKTOTAL, CKMB, CKMBINDEX, TROPONINI in the last 168 hours. BNP: Invalid input(s): POCBNP CBG: Recent Labs  Lab 02/08/20 2015 02/09/20 0016 02/09/20 0412 02/09/20 0750 02/09/20 1155  GLUCAP 117* 109* 99 96 153*   D-Dimer No results for input(s): DDIMER in the last 72 hours. Hgb A1c No  results for input(s): HGBA1C in the last 72 hours. Lipid Profile No results for input(s): CHOL, HDL, LDLCALC, TRIG, CHOLHDL, LDLDIRECT in the last 72 hours. Thyroid function studies Recent Labs    02/08/20 0116 02/08/20 0548  TSH 5.491*  --   T3FREE  --  2.5   Anemia work up No results for input(s): VITAMINB12, FOLATE, FERRITIN, TIBC, IRON, RETICCTPCT in the last 72 hours. Urinalysis    Component Value Date/Time   COLORURINE YELLOW 02/08/2020 0352   APPEARANCEUR CLEAR 02/08/2020 0352   LABSPEC 1.008 02/08/2020 0352   PHURINE 6.0 02/08/2020 0352   GLUCOSEU NEGATIVE 02/08/2020 0352   HGBUR NEGATIVE 02/08/2020 0352   BILIRUBINUR NEGATIVE 02/08/2020 0352   KETONESUR NEGATIVE 02/08/2020 0352   PROTEINUR NEGATIVE 02/08/2020 0352   NITRITE NEGATIVE 02/08/2020 0352   LEUKOCYTESUR SMALL (A) 02/08/2020 0352   Sepsis Labs Invalid input(s): PROCALCITONIN,  WBC,  LACTICIDVEN Microbiology Recent Results (from the past 240 hour(s))  Resp Panel by RT-PCR (Flu A&B, Covid) Nasopharyngeal Swab     Status: None   Collection Time: 02/08/20  1:33 AM   Specimen: Nasopharyngeal Swab; Nasopharyngeal(NP) swabs in vial transport medium  Result Value Ref Range Status   SARS Coronavirus 2 by RT PCR NEGATIVE NEGATIVE Final    Comment: (NOTE) SARS-CoV-2 target nucleic acids are NOT DETECTED.  The SARS-CoV-2 RNA is generally detectable in upper respiratory specimens during the acute phase of infection. The lowest concentration of SARS-CoV-2 viral copies this assay can detect is 138 copies/mL. A negative result does not preclude SARS-Cov-2 infection and should not be used as the sole basis for treatment or other patient management decisions. A negative result may occur with  improper specimen collection/handling, submission of specimen other than nasopharyngeal swab, presence of viral mutation(s) within the areas targeted by this assay, and inadequate number of viral copies(<138 copies/mL). A  negative result must be combined with clinical observations, patient history, and epidemiological information. The expected result is Negative.  Fact Sheet for Patients:  EntrepreneurPulse.com.au  Fact Sheet for Healthcare Providers:  IncredibleEmployment.be  This test is no t yet approved or cleared by the [place] FDA and  has been authorized for detection and/or diagnosis of SARS-CoV-2 by FDA under an Emergency Use  Authorization (EUA). This EUA will remain  in effect (meaning this test can be used) for the duration of the COVID-19 declaration under Section 564(b)(1) of the Act, 21 U.S.C.section 360bbb-3(b)(1), unless the authorization is terminated  or revoked sooner.       Influenza A by PCR NEGATIVE NEGATIVE Final   Influenza B by PCR NEGATIVE NEGATIVE Final    Comment: (NOTE) The Xpert Xpress SARS-CoV-2/FLU/RSV plus assay is intended as an aid in the diagnosis of influenza from Nasopharyngeal swab specimens and should not be used as a sole basis for treatment. Nasal washings and aspirates are unacceptable for Xpert Xpress SARS-CoV-2/FLU/RSV testing.  Fact Sheet for Patients: EntrepreneurPulse.com.au  Fact Sheet for Healthcare Providers: IncredibleEmployment.be  This test is not yet approved or cleared by the [place] FDA and has been authorized for detection and/or diagnosis of SARS-CoV-2 by FDA under an Emergency Use Authorization (EUA). This EUA will remain in effect (meaning this test can be used) for the duration of the COVID-19 declaration under Section 564(b)(1) of the Act, 21 U.S.C. section 360bbb-3(b)(1), unless the authorization is terminated or revoked.  Performed at [facility], [place] [address] [place], [place] [address]   Gastrointestinal Panel by PCR , Stool     Status: None   Collection Time: 02/08/20  4:27 PM   Specimen: STOOL  Result Value Ref  Range Status   Campylobacter species NOT DETECTED NOT DETECTED Final   Plesimonas shigelloides NOT DETECTED NOT DETECTED Final   Salmonella species NOT DETECTED NOT DETECTED Final   Yersinia enterocolitica NOT DETECTED NOT DETECTED Final   Vibrio species NOT DETECTED NOT DETECTED Final   Vibrio cholerae NOT DETECTED NOT DETECTED Final   Enteroaggregative E coli (EAEC) NOT DETECTED NOT DETECTED Final   Enteropathogenic E coli (EPEC) NOT DETECTED NOT DETECTED Final   Enterotoxigenic E coli (ETEC) NOT DETECTED NOT DETECTED Final   Shiga like toxin producing E coli (STEC) NOT DETECTED NOT DETECTED Final   Shigella/Enteroinvasive E coli (EIEC) NOT DETECTED NOT DETECTED Final   Cryptosporidium NOT DETECTED NOT DETECTED Final   Cyclospora cayetanensis NOT DETECTED NOT DETECTED Final   Entamoeba histolytica NOT DETECTED NOT DETECTED Final   Giardia lamblia NOT DETECTED NOT DETECTED Final   Adenovirus F40/41 NOT DETECTED NOT DETECTED Final   Astrovirus NOT DETECTED NOT DETECTED Final   Norovirus GI/GII NOT DETECTED NOT DETECTED Final   Rotavirus A NOT DETECTED NOT DETECTED Final   Sapovirus (I, II, IV, and V) NOT DETECTED NOT DETECTED Final    Comment: Performed at [facility], [address] [place], [place] [address]    Time coordinating discharge: Over 30 minutes  SIGNED:  Lorella Nimrod, MD  [facility] 02/09/2020, 12:02 PM  If 7PM-7AM, please contact night-coverage [email or web address]  This record has been created using Systems analyst. Errors have been sought and corrected,but may not always be located. Such creation errors do not reflect on the standard of care.

## 2020-02-09 NOTE — Progress Notes (Addendum)
[facility] Progress Note  CC:   Rectal bleeding, diarrhea   Subjective: He is sitting up in the chair. He is tolerating a soft diet. He passed a small softly formed brown BM this morning without any obvious rectal bleeding as verified by the nurse tech. No N/V. No abdominal pain. No family at the bedside.   Objective:  Vital signs in last 24 hours: Temp:  [98 F (36.7 C)-98.6 F (37 C)] 98.1 F (36.7 C) (12/16 0517) Pulse Rate:  [55-72] 63 (12/16 0517) Resp:  [13-18] 16 (12/16 0517) BP: (127-151)/(66-84) 133/67 (12/16 0517) SpO2:  [94 %-98 %] 95 % (12/16 0517) Last BM Date: 02/08/20 General:   Alert 86 year old male in NAD. Heart: RRR, no murmur.  Pulm:  Breath sounds clear throughout. Abdomen: Soft, nondistended. Nontender. + BS x 4 quads.  Extremities:  Without edema. Neurologic:  Alert and  oriented x4;  grossly normal neurologically. Psych:  Alert and cooperative. Normal mood and affect.  Intake/Output from previous day: 12/15 0701 - 12/16 0700 In: 720 [P.O.:720] Out: 3226 [Urine:3225; Stool:1] Intake/Output this shift: Total I/O In: 120 [P.O.:120] Out: 100 [Urine:100]  Lab Results: Recent Labs    02/08/20 0548 02/08/20 1107 02/09/20 0901  WBC 11.8* 10.6* 9.7  HGB 11.7* 12.3* 12.9*  HCT 35.8* 37.0* 38.2*  PLT 331 337 336   BMET Recent Labs    02/06/20 1156 02/07/20 1808 02/09/20 0901  NA 134* 132* 137  K 4.6 3.8 4.2  CL 101 102 103  CO2 22 21* 26  GLUCOSE 132* 120* 120*  BUN 14 17 11   CREATININE 1.12 1.05 1.06  CALCIUM 9.0 8.9 8.7*   LFT Recent Labs    02/07/20 1808  PROT 6.3*  ALBUMIN 3.8  AST 20  ALT 15  ALKPHOS 67  BILITOT 0.6   CT ABDOMEN PELVIS W CONTRAST  Result Date: 02/08/2020 CLINICAL DATA:  Unintended weight loss, diarrhea, hematochezia EXAM: CT ABDOMEN AND PELVIS WITH CONTRAST TECHNIQUE: Multidetector CT imaging of the abdomen and pelvis was performed using the standard protocol following bolus  administration of intravenous contrast. CONTRAST:  155mL OMNIPAQUE IOHEXOL 300 MG/ML  SOLN COMPARISON:  07/01/2013 FINDINGS: Lower chest: Mild bibasilar bronchiectasis and minimal fibrosis, left greater than right best appreciated at the costophrenic angles. No superimposed focal pulmonary infiltrate. Coronary artery bypass grafting has been performed. Global cardiac size within normal limits. Small hiatal hernia. Hepatobiliary: Mild hepatic steatosis. No enhancing liver lesion. No intra or extrahepatic biliary ductal dilation. Gallbladder unremarkable. Pancreas: Unremarkable Spleen: Unremarkable Adrenals/Urinary Tract: The adrenal glands are unremarkable. The kidneys are normal in position. Mild bilateral renal cortical atrophy left slightly greater than right. 4 mm in nonobstructing calculus noted within the lower pole of the left kidney. No ureteral calculi. No hydronephrosis. No enhancing renal masses. Marked central prostatic hypertrophy indents and protrudes into the base of the bladder. The bladder is not distended, however. Tiny bladder diverticulum is identified suggesting changes of at least mild bladder outlet obstruction. Stomach/Bowel: Stomach is within normal limits. Appendix appears normal. No evidence of bowel wall thickening, distention, or inflammatory changes. No free intraperitoneal gas or fluid. Vascular/Lymphatic: Extensive aortoiliac atherosclerotic calcification. No aortic aneurysm. Particularly prominent atherosclerotic calcification is seen at the renal ostia bilaterally as well as the mesenteric arterial vasculature, however, the degree of stenosis is not well assessed on this non arteriographic study. No pathologic adenopathy within the abdomen and pelvis. Reproductive: As noted above, the prostate gland is markedly hypertrophied centrally,  protruding into the bladder lumen. Seminal vesicles are unremarkable. Other: Rectum unremarkable.  No abdominal wall hernia identified.  Musculoskeletal: Right hip pinning has been performed. Degenerative changes are seen within the lumbar spine. No acute bone abnormality. IMPRESSION: No definite radiographic explanation for the patient's reported weight loss. Peripheral vascular disease, however, is noted and, while similar to prior examination, the degree of stenosis involving the mesenteric and renal arterial vasculature is not well assessed on this exam. If there is clinical evidence of chronic mesenteric ischemia, CT arteriography may be more helpful to better assess the degree of stenosis. Mild hepatic steatosis. Mild nonobstructing left nephrolithiasis. Marked central prostatic hypertrophy, which protrudes into the bladder lumen, with at least mild bladder outlet obstruction. The bladder is not distended at this time. Aortic Atherosclerosis (ICD10-I70.0). Electronically Signed   By: Fidela Salisbury MD   On: 02/08/2020 03:43    Assessment / Plan:  69.  86 year old male with chronic diarrhea with new onset rectal bleeding.  FOBT negative. Hg stable 12.9. CTAP without evidence of colitis.  WBC 13.7  -> 11.8 -> 9.7.  He is afebrile.  GI pathogen panel negative. C. Diff pending. No further diarrhea or hematochezia since arriving to the ED. -No plans for colonoscopy evaluation at this time -Low residue diet -Continue IV fluids -Anusol HC 25 mg suppository 1 PR nightly x 3 nights -Repeat CBC, CMP in am -Continue to monitor patient for diarrhea and rectal bleeding   2.  History of coronary artery disease s/p CABG  3.  History of dementia  4. DM II  Further recommendations per Dr. Fuller Plan    LOS: 0 days   Noralyn Pick  02/09/2020, 10:02 AM     Attending Physician Note   I have taken an interval history, reviewed the chart and examined the patient. I agree with the Advanced Practitioner's note, impression and recommendations.  No diarrhea or rectal bleeding overnight or this morning. GI pathogen panel is  negative. C diff is pending.   * Continue Anusol HC supp PR HS x 3 days then HS prn  * Add cholestyramine 4 g bid if diarrhea recurs * If he continues without diarrhea, rectal bleeding should be OK for discharge this afternoon or tomorrow from GI standpoint.  * Outpatient GI follow up with Dr. Wilfrid Lund 12/22 at 11 am as scheduled.  * GI signing off. We are available if needed.   Lucio Edward, MD [facility]

## 2020-02-09 NOTE — Progress Notes (Signed)
Assessment unchanged. Wife and pt verbalized understanding of dc instructions including new medications as well as those to resume, follow up care and when to the doctor. Discharged via wc to front entrance accompanied by RN.

## 2020-02-15 ENCOUNTER — Encounter: Payer: Self-pay | Admitting: *Deleted

## 2020-02-15 ENCOUNTER — Ambulatory Visit (INDEPENDENT_AMBULATORY_CARE_PROVIDER_SITE_OTHER): Payer: No Typology Code available for payment source | Admitting: Gastroenterology

## 2020-02-15 VITALS — BP 120/64 | HR 80 | Ht 67.0 in | Wt 153.0 lb

## 2020-02-15 DIAGNOSIS — Z23 Encounter for immunization: Secondary | ICD-10-CM

## 2020-02-15 DIAGNOSIS — K529 Noninfective gastroenteritis and colitis, unspecified: Secondary | ICD-10-CM

## 2020-02-15 DIAGNOSIS — K625 Hemorrhage of anus and rectum: Secondary | ICD-10-CM | POA: Diagnosis not present

## 2020-02-15 NOTE — Patient Instructions (Signed)
You have been scheduled for a colonoscopy. Please follow written instructions given to you at your visit today.  Please pick up your prep supplies at the pharmacy within the next 1-3 days. If you use inhalers (even only as needed), please bring them with you on the day of your procedure.  If you are age 84 or older, your body mass index should be between 23-30. Your Body mass index is 23.96 kg/m. If this is out of the aforementioned range listed, please consider follow up with your Primary Care Provider.  Due to recent changes in healthcare laws, you may see the results of your imaging and laboratory studies on MyChart before your provider has had a chance to review them.  We understand that in some cases there may be results that are confusing or concerning to you. Not all laboratory results come back in the same time frame and the provider may be waiting for multiple results in order to interpret others.  Please give [place] 48 hours in order for your provider to thoroughly review all the results before contacting the office for clarification of your results.   It was a pleasure to see you today!  Dr. Loletha Carrow

## 2020-02-15 NOTE — Progress Notes (Signed)
[facility] Progress Note  Chief Complaint: Chronic diarrhea  Subjective  History: Johnny [name] was seen along with his wife on 11/16/2019 for chronic diarrhea, and it was somewhat difficult to tell if it was still occurring.  We discovered he had a positive stool study for Campylobacter at the [place] that was treated with a azithromycin.  [facility] also indicated "fat in the stool" (both that and the infection were discovered from subsequent records received after his office visit with [place]).  I opted for further observation to see how much or if the diarrhea would recur given his age and condition.  His wife contacted our office last week after an ED visit for this diarrhea with associated rectal bleeding.  Hemoglobin was normal and he was sent home from ED.  Following day his wife contacted [place] again with worsening diarrhea and bleeding and I recommended hospital admission through the ED.  This occurred and he was seen by our consult service.  Diarrhea reportedly was subsiding, hemoglobin remained stable and the decision was made not to do inpatient colonoscopy.  GI pathogen panel was negative.  He was started on cholestyramine.   This is his first office follow-up since that discharge.  Johnny [name] has been stable since last week's hospital stay.  His bleeding has stopped and his wife is still using Preparation H suppositories daily.  She finds it difficult to give him the cholestyramine powder twice a day, and she was unaware of the need to do it 2 hours away from other medications.  Johnny [name] tends to get up around midday, making medication dosing challenging. The diarrhea has waxed and waned some but never gone away since I last saw him in September.  Sometimes it does not seem to occur but on bad days it will be 5 or 6 loose BMs.  He seems to deny abdominal pain.  Most recent cardiology office note from 12/19/2019 was reviewed.  Patient was not reporting angina. Last colonoscopy by Dr. Sharlett Iles in  December 2008 ROS: Cardiovascular:  no chest pain Respiratory: no dyspnea  The patient's Past Medical, Family and Social History were reviewed and are on file in the EMR. Past Medical History:  Diagnosis Date  . Aortic atherosclerosis ([place])   . Atherosclerotic heart disease   . BPH (benign prostatic hyperplasia)   . Calculus of kidney   . Calculus of ureter   . Campylobacter diarrhea   . CORONARY ARTERY DISEASE Nov 2007   CABG x3   . Dementia ([place])   . DIABETES MELLITUS, TYPE II   . Dry skin dermatitis   . Glaucoma   . Hammer toe   . Hepatic steatosis   . Hip fracture, right ([place])   . History of kidney stones   . Hydronephrosis   . HYPERLIPIDEMIA    Not able to take statins.  [name] HYPERTENSION   . IDA (iron deficiency anemia)   . Internal hemorrhoids   . Kidney stone 2015  . Peripheral vascular disease, unspecified ([place])   . Persistent mood disorder ([place])   . Presence of intraocular lens   . Toe deformity   . Vitamin B 12 deficiency   . Vitamin D deficiency    Past Surgical History:  Procedure Laterality Date  . CARDIAC CATHETERIZATION N/A 10/09/2015   Procedure: Left Heart Cath and Cors/Grafts Angiography;  Surgeon: Jettie Booze, MD;  Location: [facility];  Service: Cardiovascular;  Laterality: N/A;  . CORONARY ANGIOPLASTY WITH STENT PLACEMENT  01/09/2006   Three-vessel CAD  His right coronary artery is extremely tight.  He has moderate to severe disease in the LAD & left circumflex artery.  The LAD is very heavily calcified and the proximal and ostial LAD is not a good target for PTCA.  We will refer him to CVTS for further evaluation  . CORONARY ANGIOPLASTY WITH STENT PLACEMENT  02/14/2000   EF - of around 65-70%./PTCA and stenting of his mid right  coronary artery (4.0 x 18 mm NIR stent to the mid RCA).  He was noted  to have a 50% ostial stenosis at the time of his heart catheterization   . CORONARY ARTERY BYPASS GRAFT  01/17/2006   x3 using a left  internal mammary artery graft to left anterior descending coronary artery, saphenous vein graft to obtuse marginal branch, saphenous vein graft to posterior descending branch / Endoscopic vein harvesting from the right leg.  [name] GASTROCNEMIUS RECESSION  03/11/2012   Procedure: GASTROCNEMIUS SLIDE;  Surgeon: Wylene Simmer, MD;  Location: [facility];  Service: Orthopedics;  Laterality: Right;  . HIP FRACTURE SURGERY     right  . TENDON REPAIR  03/11/2012   Procedure: TENDON REPAIR;  Surgeon: Wylene Simmer, MD;  Location: [facility];  Service: Orthopedics;  Laterality: Right;  Right Tibialis Tendon Repair With Gastrocnemius Recession; Possible PLANTARIS AUTOGRAFT   . TONSILLECTOMY    . TONSILLECTOMY    . VASECTOMY      Objective:  Med list reviewed  Current Outpatient Medications:  .  Cholecalciferol 25 MCG (1000 UT) capsule, Take 1 capsule (1,000 Units total) by mouth daily., Disp: 30 capsule, Rfl: 1 .  cholestyramine (QUESTRAN) 4 g packet, Take 1 packet by mouth 2 (two) times daily., Disp: 60 each, Rfl: 11 .  finasteride (PROSCAR) 5 MG tablet, Take 5 mg by mouth daily., Disp: , Rfl:  .  hydrocortisone (ANUSOL-HC) 25 MG suppository, Place 1 suppository (25 mg total) rectally at bedtime., Disp: 12 suppository, Rfl: 0 .  loperamide (IMODIUM) 2 MG capsule, Take 2 mg by mouth daily as needed for diarrhea or loose stools., Disp: , Rfl:  .  memantine (NAMENDA) 10 MG tablet, Take 10 mg by mouth 2 (two) times daily., Disp: , Rfl:  .  Multiple Vitamins-Minerals (PRESERVISION AREDS 2 PO), Take 1 capsule by mouth in the morning and at bedtime., Disp: , Rfl:  .  nitroGLYCERIN (NITROSTAT) 0.4 MG SL tablet, DISSOLVE ONE TABLET UNDER THE TONGUE EVERY 5 MINUTES AS NEEDED FOR CHEST PAIN. (Patient taking differently: Place 0.4 mg under the tongue every 5 (five) minutes as needed for chest pain.), Disp: 25 tablet, Rfl: 5 .  REPATHA SURECLICK [id number] MG/ML SOAJ, INJECT 1 SYRINGE EVERY 14 DAYS  (Patient taking differently: Inject 1 each into the skin See admin instructions. Every 14 days), Disp: 2 mL, Rfl: 11 .  sodium chloride (OCEAN) 0.65 % SOLN nasal spray, Place 1 spray into both nostrils at bedtime., Disp: , Rfl:  .  tamsulosin (FLOMAX) 0.4 MG CAPS capsule, Take 1 capsule by mouth daily. , Disp: , Rfl:  .  Travoprost, BAK Free, (TRAVATAN) 0.004 % SOLN ophthalmic solution, Place 1 drop into both eyes at bedtime., Disp: , Rfl:    Vital signs in last 24 hrs: Vitals:   02/15/20 1103  BP: 120/64  Pulse: 80    Physical Exam  Pleasant and conversational.  His wife is here for the entire visit.  He has some memory difficulty as before.  Slow gait, able to get on exam table without assistance.  HEENT: sclera anicteric, oral mucosa moist without lesions  Neck: supple, no thyromegaly, JVD or lymphadenopathy  Cardiac: RRR without murmurs, S1S2 heard, no peripheral edema  Pulm: clear to auscultation bilaterally, normal RR and effort noted  Abdomen: soft, no tenderness, with active bowel sounds. No guarding or palpable hepatosplenomegaly.  Skin; warm and dry, no jaundice or rash  Labs:  CBC Latest Ref Rng & Units 02/09/2020 02/08/2020 02/08/2020  WBC 4.0 - 10.5 K/uL 9.7 10.6(H) 11.8(H)  Hemoglobin 13.0 - 17.0 g/dL 12.9(L) 12.3(L) 11.7(L)  Hematocrit 39.0 - 52.0 % 38.2(L) 37.0(L) 35.8(L)  Platelets 150 - 400 K/uL 336 337 331   CMP Latest Ref Rng & Units 02/09/2020 02/07/2020 02/06/2020  Glucose 70 - 99 mg/dL 120(H) 120(H) 132(H)  BUN 8 - 23 mg/dL 11 17 14   Creatinine 0.61 - 1.24 mg/dL 1.06 1.05 1.12  Sodium 135 - 145 mmol/L 137 132(L) 134(L)  Potassium 3.5 - 5.1 mmol/L 4.2 3.8 4.6  Chloride 98 - 111 mmol/L 103 102 101  CO2 22 - 32 mmol/L 26 21(L) 22  Calcium 8.9 - 10.3 mg/dL 8.7(L) 8.9 9.0  Total Protein 6.5 - 8.1 g/dL - 6.3(L) 6.5  Total Bilirubin 0.3 - 1.2 mg/dL - 0.6 0.7  Alkaline Phos 38 - 126 U/L - 67 64  AST 15 - 41 U/L - 20 20  ALT 0 - 44 U/L - 15 14    Negative GI pathogen panel 02/08/2020 ___________________________________________ Radiologic studies:  CLINICAL DATA:  Unintended weight loss, diarrhea, hematochezia   EXAM: CT ABDOMEN AND PELVIS WITH CONTRAST   TECHNIQUE: Multidetector CT imaging of the abdomen and pelvis was performed using the standard protocol following bolus administration of intravenous contrast.   CONTRAST:  137mL OMNIPAQUE IOHEXOL 300 MG/ML  SOLN   COMPARISON:  07/01/2013   FINDINGS: Lower chest: Mild bibasilar bronchiectasis and minimal fibrosis, left greater than right best appreciated at the costophrenic angles. No superimposed focal pulmonary infiltrate. Coronary artery bypass grafting has been performed. Global cardiac size within normal limits. Small hiatal hernia.   Hepatobiliary: Mild hepatic steatosis. No enhancing liver lesion. No intra or extrahepatic biliary ductal dilation. Gallbladder unremarkable.   Pancreas: Unremarkable   Spleen: Unremarkable   Adrenals/Urinary Tract: The adrenal glands are unremarkable. The kidneys are normal in position. Mild bilateral renal cortical atrophy left slightly greater than right. 4 mm in nonobstructing calculus noted within the lower pole of the left kidney. No ureteral calculi. No hydronephrosis. No enhancing renal masses. Marked central prostatic hypertrophy indents and protrudes into the base of the bladder. The bladder is not distended, however. Tiny bladder diverticulum is identified suggesting changes of at least mild bladder outlet obstruction.   Stomach/Bowel: Stomach is within normal limits. Appendix appears normal. No evidence of bowel wall thickening, distention, or inflammatory changes. No free intraperitoneal gas or fluid.   Vascular/Lymphatic: Extensive aortoiliac atherosclerotic calcification. No aortic aneurysm. Particularly prominent atherosclerotic calcification is seen at the renal ostia bilaterally as well as the  mesenteric arterial vasculature, however, the degree of stenosis is not well assessed on this non arteriographic study. No pathologic adenopathy within the abdomen and pelvis.   Reproductive: As noted above, the prostate gland is markedly hypertrophied centrally, protruding into the bladder lumen. Seminal vesicles are unremarkable.   Other: Rectum unremarkable.  No abdominal wall hernia identified.   Musculoskeletal: Right hip pinning has been performed. Degenerative changes are seen within the lumbar spine. No acute bone abnormality.  IMPRESSION: No definite radiographic explanation for the patient's reported weight loss. Peripheral vascular disease, however, is noted and, while similar to prior examination, the degree of stenosis involving the mesenteric and renal arterial vasculature is not well assessed on this exam. If there is clinical evidence of chronic mesenteric ischemia, CT arteriography may be more helpful to better assess the degree of stenosis.   Mild hepatic steatosis.   Mild nonobstructing left nephrolithiasis.   Marked central prostatic hypertrophy, which protrudes into the bladder lumen, with at least mild bladder outlet obstruction. The bladder is not distended at this time.   Aortic Atherosclerosis (ICD10-I70.0).     Electronically Signed   By: Fidela Salisbury MD   On: 02/08/2020 03:43  ____________________________________________ Other:  Echocardiogram August 2017  Left ventricle: The cavity size was normal. There was mild    concentric hypertrophy. Systolic function was normal. The    estimated ejection fraction was in the range of 60% to 65%. Mild    hypokinesis of the anteroseptal myocardium. Doppler parameters    are consistent with abnormal left ventricular relaxation (grade 1    diastolic dysfunction). Doppler parameters are consistent with    indeterminate ventricular filling pressure.  - Aortic valve: Transvalvular velocity was within the  normal range.    There was no stenosis. There was no regurgitation.  - Mitral valve: Transvalvular velocity was within the normal range.    There was no evidence for stenosis. There was mild regurgitation.  - Right ventricle: The cavity size was normal. Wall thickness was    normal. Systolic function was normal.  - Tricuspid valve: There was mild regurgitation.  - Pulmonary arteries: Systolic pressure was within the normal    range. PA peak pressure: 24 mm Hg (S).   _____________________________________________ Assessment & Plan  Assessment: Encounter Diagnoses  Name Primary?  . Chronic diarrhea Yes  . Rectal bleeding     Chronic diarrhea for perhaps as long as 6 months, previous infection treated but with persistent diarrhea.  Consider microscopic colitis, less likely new onset IBD.  Bleeding is probably hemorrhoidal and precipitated by diarrhea.  Plan: Colonoscopy.  They were agreeable after discussion of procedure and risks.  Planning to do it shortly after the new year  The benefits and risks of the planned procedure were described in detail with the patient or (when appropriate) their health care proxy.  Risks were outlined as including, but not limited to, bleeding, infection, perforation, adverse medication reaction leading to cardiac or pulmonary decompensation, pancreatitis (if ERCP).  The limitation of incomplete mucosal visualization was also discussed.  No guarantees or warranties were given. Patient at increased risk for cardiopulmonary complications of procedure due to medical comorbidities.   Decrease cholestyramine to 1 packet a day. Stop Preparation H suppositories, but they can be resumed if bleeding recurs.   40 minutes were spent on this encounter (including chart review, history/exam, counseling/coordination of care, and documentation)  Nelida Meuse III

## 2020-02-28 ENCOUNTER — Other Ambulatory Visit: Payer: Self-pay

## 2020-02-28 ENCOUNTER — Ambulatory Visit (AMBULATORY_SURGERY_CENTER): Payer: No Typology Code available for payment source | Admitting: Gastroenterology

## 2020-02-28 ENCOUNTER — Encounter: Payer: Self-pay | Admitting: Gastroenterology

## 2020-02-28 VITALS — BP 158/79 | HR 70 | Temp 96.6°F | Resp 17 | Ht 67.0 in | Wt 153.0 lb

## 2020-02-28 DIAGNOSIS — K648 Other hemorrhoids: Secondary | ICD-10-CM

## 2020-02-28 DIAGNOSIS — K529 Noninfective gastroenteritis and colitis, unspecified: Secondary | ICD-10-CM

## 2020-02-28 DIAGNOSIS — K625 Hemorrhage of anus and rectum: Secondary | ICD-10-CM

## 2020-02-28 DIAGNOSIS — D122 Benign neoplasm of ascending colon: Secondary | ICD-10-CM

## 2020-02-28 MED ORDER — SODIUM CHLORIDE 0.9 % IV SOLN
500.0000 mL | INTRAVENOUS | Status: DC
Start: 2020-02-28 — End: 2020-02-28

## 2020-02-28 NOTE — Op Note (Signed)
[place] Patient Name: Johnny [name] Procedure Date: 02/28/2020 1:09 PM MRN: [id number] Endoscopist: [place]. Loletha Carrow , MD Age: 87 Referring MD:  Date of Birth: Oct 21, 1932 Gender: Male Account #: [id number] Procedure:                Colonoscopy Indications:              Chronic diarrhea, Rectal bleeding Medicines:                Monitored Anesthesia Care Procedure:                Pre-Anesthesia Assessment:                           - Prior to the procedure, a History and Physical                            was performed, and patient medications and                            allergies were reviewed. The patient's tolerance of                            previous anesthesia was also reviewed. The risks                            and benefits of the procedure and the sedation                            options and risks were discussed with the patient.                            All questions were answered, and informed consent                            was obtained. Prior Anticoagulants: The patient has                            taken no previous anticoagulant or antiplatelet                            agents. ASA Grade Assessment: III - A patient with                            severe systemic disease. After reviewing the risks                            and benefits, the patient was deemed in                            satisfactory condition to undergo the procedure.                           After obtaining informed consent, the colonoscope  was passed under direct vision. Throughout the                            procedure, the patient's blood pressure, pulse, and                            oxygen saturations were monitored continuously. The                            Olympus CF-HQ190 [id number]) Colonoscope was                            introduced through the anus and advanced to the the                            terminal ileum, with  identification of the                            appendiceal orifice and IC valve. The colonoscopy                            was performed without difficulty. The patient                            tolerated the procedure well. The quality of the                            bowel preparation was excellent. The terminal                            ileum, ileocecal valve, appendiceal orifice, and                            rectum were photographed. Scope In: 1:53:33 PM Scope Out: 2:11:29 PM Scope Withdrawal Time: 0 hours 14 minutes 29 seconds  Total Procedure Duration: 0 hours 17 minutes 56 seconds  Findings:                 The digital rectal exam findings include decreased                            sphincter tone.                           The terminal ileum appeared normal.                           Two sessile polyps were found in the ascending                            colon. The polyps were diminutive in size. These                            polyps were removed with a cold snare. Resection  and retrieval were complete.                           Normal mucosa was found in the entire colon.                            Biopsies for histology were taken with a cold                            forceps from the right colon and left colon for                            evaluation of microscopic colitis.                           Internal hemorrhoids were found. The hemorrhoids                            were medium-sized.                           The exam was otherwise without abnormality on                            direct and retroflexion views. Complications:            No immediate complications. Estimated Blood Loss:     Estimated blood loss was minimal. Impression:               - Decreased sphincter tone found on digital rectal                            exam.                           - The examined portion of the ileum was normal.                            - Two diminutive polyps in the ascending colon,                            removed with a cold snare. Resected and retrieved.                           - Normal mucosa in the entire examined colon.                            Biopsied.                           - Internal hemorrhoids.                           - The examination was otherwise normal on direct  and retroflexion views. Recommendation:           - Patient has a contact number available for                            emergencies. The signs and symptoms of potential                            delayed complications were discussed with the                            patient. Return to normal activities tomorrow.                            Written discharge instructions were provided to the                            patient.                           - Resume previous diet.                           - Continue present medications.                           - Await pathology results.                           - No recommendation at this time regarding repeat                            colonoscopy due to age.                           - If hemorrhoidal banding persists despite control                            of diarrhea, banding may be necessary. Lataysha Vohra L. Myrtie Neither, MD 02/28/2020 2:20:43 PM This report has been signed electronically.

## 2020-02-28 NOTE — Patient Instructions (Signed)
Discharge instructions given. ?Handouts on polyps and Hemorrhoids. ?Resume previous medications. ?YOU HAD AN ENDOSCOPIC PROCEDURE TODAY AT THE [facility]:   Refer to the procedure report that was given to you for any specific questions about what was found during the examination.  If the procedure report does not answer your questions, please call your gastroenterologist to clarify.  If you requested that your care partner not be given the details of your procedure findings, then the procedure report has been included in a sealed envelope for you to review at your convenience later. ? ?YOU SHOULD EXPECT: Some feelings of bloating in the abdomen. Passage of more gas than usual.  Walking can help get rid of the air that was put into your GI tract during the procedure and reduce the bloating. If you had a lower endoscopy (such as a colonoscopy or flexible sigmoidoscopy) you may notice spotting of blood in your stool or on the toilet paper. If you underwent a bowel prep for your procedure, you may not have a normal bowel movement for a few days. ? ?Please Note:  You might notice some irritation and congestion in your nose or some drainage.  This is from the oxygen used during your procedure.  There is no need for concern and it should clear up in a day or so. ? ?SYMPTOMS TO REPORT IMMEDIATELY: ? ?Following lower endoscopy (colonoscopy or flexible sigmoidoscopy): ? Excessive amounts of blood in the stool ? Significant tenderness or worsening of abdominal pains ? Swelling of the abdomen that is new, acute ? Fever of 100?F or higher ? ? ?For urgent or emergent issues, a gastroenterologist can be reached at any hour by calling [phone]. ?Do not use MyChart messaging for urgent concerns.  ? ? ?DIET:  We do recommend a small meal at first, but then you may proceed to your regular diet.  Drink plenty of fluids but you should avoid alcoholic beverages for 24 hours. ? ?ACTIVITY:  You should plan to take it  easy for the rest of today and you should NOT DRIVE or use heavy machinery until tomorrow (because of the sedation medicines used during the test).   ? ?FOLLOW UP: ?Our staff will call the number listed on your records 48-72 hours following your procedure to check on you and address any questions or concerns that you may have regarding the information given to you following your procedure. If we do not reach you, we will leave a message.  We will attempt to reach you two times.  During this call, we will ask if you have developed any symptoms of COVID 19. If you develop any symptoms (ie: fever, flu-like symptoms, shortness of breath, cough etc.) before then, please call [phone].  If you test positive for Covid 19 in the 2 weeks post procedure, please call and report this information to us.   ? ?If any biopsies were taken you will be contacted by phone or by letter within the next 1-3 weeks.  Please call us at [phone] if you have not heard about the biopsies in 3 weeks.  ? ? ?SIGNATURES/CONFIDENTIALITY: ?You and/or your care partner have signed paperwork which will be entered into your electronic medical record.  These signatures attest to the fact that that the information above on your After Visit Summary has been reviewed and is understood.  Full responsibility of the confidentiality of this discharge information lies with you and/or your care-partner.  ?

## 2020-02-28 NOTE — Progress Notes (Signed)
Called to room to assist during endoscopic procedure.  Patient ID and intended procedure confirmed with present staff. Received instructions for my participation in the procedure from the performing physician.  

## 2020-02-28 NOTE — Progress Notes (Signed)
A and O x3. Report to RN. Tolerated MAC anesthesia well.

## 2020-03-01 ENCOUNTER — Telehealth: Payer: Self-pay

## 2020-03-01 NOTE — Telephone Encounter (Signed)
  Follow up Call-  Call back number 02/28/2020  Post procedure Call Back phone  # [phone]  Permission to leave phone message Yes  Some recent data might be hidden     Patient questions:  Do you have a fever, pain , or abdominal swelling? Yes.   Pain Score  0 *  Have you tolerated food without any problems? Yes.    Have you been able to return to your normal activities? Yes.    Do you have any questions about your discharge instructions: Diet   No. Medications  No. Follow up visit  No.  Do you have questions or concerns about your Care? No.  Actions: * If pain score is 4 or above: No action needed, pain <4.  1. Have you developed a fever since your procedure? no  2.   Have you had an respiratory symptoms (SOB or cough) since your procedure? no  3.   Have you tested positive for COVID 19 since your procedure no  4.   Have you had any family members/close contacts diagnosed with the COVID 19 since your procedure?  no   If yes to any of these questions please route to Laverna Peace, RN and Karlton Lemon, RN

## 2020-07-13 ENCOUNTER — Telehealth: Payer: Self-pay

## 2020-07-13 NOTE — Telephone Encounter (Signed)
Spoke with patient's [name] and scheduled an in-person Palliative Consult for 07/19/20 @ 10AM.  COVID screening was negative. No pets in home. Patient lives with daughter.   Consent obtained; updated Outlook/Netsmart/Team List and Epic.   Family is aware they may be receiving a call from NP the day before or day of to confirm appointment.

## 2020-07-19 ENCOUNTER — Other Ambulatory Visit: Payer: Self-pay

## 2020-07-19 ENCOUNTER — Other Ambulatory Visit: Payer: Medicare Other | Admitting: Nurse Practitioner

## 2020-07-19 VITALS — BP 152/70 | HR 68 | Resp 18

## 2020-07-19 DIAGNOSIS — Z515 Encounter for palliative care: Secondary | ICD-10-CM

## 2020-07-19 DIAGNOSIS — F0280 Dementia in other diseases classified elsewhere without behavioral disturbance: Secondary | ICD-10-CM

## 2020-07-19 DIAGNOSIS — R52 Pain, unspecified: Secondary | ICD-10-CM

## 2020-07-19 DIAGNOSIS — R296 Repeated falls: Secondary | ICD-10-CM

## 2020-07-19 DIAGNOSIS — G309 Alzheimer's disease, unspecified: Secondary | ICD-10-CM

## 2020-07-19 NOTE — Progress Notes (Signed)
[facility] Consult Note Telephone: [phone]  Fax: [phone]    Date of encounter: 07/19/20 PATIENT NAME: Johnny [name] [address] [place] [address]   [phone] (home)  DOB: 1932/03/18 MRN: [id number]  PRIMARY CARE PROVIDER:    Reeves Dam, MD,  [place] [address] [phone]  REFERRING PROVIDER:   Reeves Dam, MD [address] [place],  [place] [address] [phone]  RESPONSIBLE PARTY:    Contact Information    Name Relation Home Work [place] Spouse [phone]  [phone]     I met face to face with patient and family in  home.  Palliative Care was asked to follow this patient by consultation request of  Reeves Dam, MD to address advance care planning and complex medical decision making. This is the initial visit.                                   ASSESSMENT AND PLAN / RECOMMENDATIONS:   Advance Care Planning/Goals of Care: Goals include to maximize quality of life through symptom management.  Today's visit consisted of building trust and discussions on Palliative care medicine as a specialized medical care for people living with serious illness, aimed at facilitating improved quality of life through symptoms relief, assisting with advance care planning and establishing goals of care. Patient and his wife expressed appreciation for education provided on Palliative care and how it differs from Hospice service. Our advance care planning conversation included a discussion about:     The value and importance of advance care planning   Experiences with loved ones who have been seriously ill or have died   Exploration of personal, cultural or spiritual beliefs that might influence medical decisions   Exploration of goals of care in the event of a sudden injury or illness   Review and updating or creation of an advance directive document  .  Decision not to resuscitate or to de-escalate disease focused treatments due to poor prognosis.  CODE STATUS: DNR Goals of care: Patient's goal of care is comfort while preserving function.  Directives: After discussions on ramifications and implications of code status, patient verbalized desire to not be resuscitated in the event of cardiac or respiratory arrest. He desires for his code status to be Do Not Resuscitated. DNR form signed for patient today.  The need to complete a MOST form was discussed, patient and his wife expressed interest in completing a MOST form today. Discussed and reviewed sections of the form in detail, opportunity for questions given, all questions answered. MOST form completed and signed with patient and his wife, signed form given to his wife to keep and take to patient's medical appointments, copy of MOST and DNR forms uploaded to [facility] EMR. Detail of MOST form include limited additional intervention, antibiotics if indicated, IV fluids for a defined trial period, no feeding tube. Palliative care will continue to provide support to patient, family and the medical team.   I spent 48 minutes providing this consultation. More than 50% of the time in this consultation was spent in counseling and care coordination. -------------------------------------------------------------------------  Symptom Management/Plan: Frequent fall: falls related to unsteady gait, ambulates with a walker. Wife report patient has been in PT/OT in the past without significant improvement in function, saying they are not interested any any more therapy. Continue supportive  care. Assist patient with ADLs while encouraging patient to do as much as possible for self. Generalized pain: wife report patient denies pain but would complain of hurting all over. Patient has prescription for Acetaminophen 57m every 4 hrs as needed. Patient's wife advised to give patient one tablet by mouth twice a  day scheduled as patient may not ask for pain medication due to poor cognition.  Dementia: No report of behavior concerns. Continue supportive care. Encourage activities that are both stimulating and enjoyable to patient. Discussion on disease trajectory of dementia with patient and his wife, as it is progressive and terminal and likely to eventually lead to dysphagia, weight loss and worsening immobility. Emotional and supportive care provided.   Follow up Palliative Care Visit: Palliative care will continue to follow for complex medical decision making, advance care planning, and clarification of goals. Return in about 4 weeks or prn.  PPS: 50%  HOSPICE ELIGIBILITY/DIAGNOSIS: TBD  Chief Complaint: Frequent falls  History obtained from review of Epic EMR and discussion with Mr. OCaetanoand his wife.  HISTORY OF PRESENT ILLNESS:  TNichael [name] is a 87y.o. year old male with Dementia (FAST 6b), CAD with DES x 2 and CABG in 2007, PVD, Type diabetes (diet controlled), glaucoma and macular degeneration, hx of frequent diarrhea and rectal bleed. Patient with complaint of frequent falls in the context of ongoing generalized weakness in the context of deconditioning related to his several comorbid conditions. His wife report several falls in his home and outside his home. Wife report fall not associated with syncope or seizures. Report patient would on some days stay in bed all day refusing to get out of bed complaining he does not feel without verbalizing any particular concerns. Patient report doing well today, saying to this NP that there is nothing wrong with him. No report of fever, chills, SOB, chest pain, or uncontrolled pain. Patient denied dizziness or lightheadedness. He [place] a [place] [facility]  Component Ref Range & Units 5 mo ago  (02/09/20) 5 mo ago  (02/07/20) 5 mo ago  (02/06/20) 8 mo ago  (11/16/19) 3 yr ago  (10/16/16) 4 yr ago  (10/09/15) 4 yr  ago  (10/06/15)  Sodium 135 - 145 mmol/L 137  132Low  134Low  132Low R  135 R  138  132Low   Potassium 3.5 - 5.1 mmol/L 4.2  3.8 CM  4.6  4.3 R  4.5 R  4.1  4.4 CM   Chloride 98 - 111 mmol/L 103  102  101  99 R  98 R  109 R  102 R   CO2 22 - 32 mmol/L 26  21Low  22  27 R  22 R  25  24   Glucose, Bld 70 - 99 mg/dL 120High  120High CM  132High CM  177High  142High R  105High R  198High R   Comment: Glucose reference range applies only to samples taken after fasting for at least 8 hours.  BUN 8 - 23 mg/dL _0 R  16 R  18 R  18 R   Creatinine, Ser 0.61 - 1.24 mg/dL 1.06  1.05  1.12  1.13 R  0.91 R  0.98  0.96   Calcium 8.9 - 10.3 mg/dL 8.7Low  8.9  9.0  9.3 R  10.7High R  9.8  9.9   GFR, Estimated >60 mL/min >60  >60 CM  >  60 CM        Component Ref Range & Units 5 mo ago  (02/09/20) 5 mo ago  (02/08/20) 5 mo ago  (02/08/20) 5 mo ago  (02/07/20) 5 mo ago  (02/06/20) 8 mo ago  (11/16/19) 4 yr ago  (10/09/15)  WBC 4.0 - 10.5 K/uL 9.7  10.6High  11.8High  13.7High  12.2High  9.8  7.7   RBC 4.22 - 5.81 MIL/uL 4.20Low  4.05Low  3.94Low  4.39  4.26  4.47 R  4.50   Hemoglobin 13.0 - 17.0 g/dL 12.9Low  12.3Low  11.7Low  13.0  12.7Low  13.5  13.8   HCT 39.0 - 52.0 % 38.2Low  37.0Low  35.8Low  40.0  39.0  39.9  41.9   MCV 80.0 - 100.0 fL 91.0  91.4  90.9  91.1  91.5  89.3 R  93.1 R   MCH 26.0 - 34.0 pg 30.7  30.4  29.7  29.6  29.8   30.7   MCHC 30.0 - 36.0 g/dL 33.8  33.2  32.7  32.5  32.6  33.8  32.9   RDW 11.5 - 15.5 % 13.2  13.2  13.2  13.1  13.2  13.7  13.4   Platelets 150 - 400 K/uL 336  337  331  423High  437High  361.0 R        I reviewed available labs, medications, imaging, studies and related documents from the EMR.  Records reviewed and summarized above.   ROS EYES: denies acute vision changes ENMT: denies dysphagia Cardiovascular: denies chest pain, denies DOE Pulmonary: denies cough, denies increased  SOB Abdomen: endorses fair appetite, denies constipation, endorses continence of bowel GU: denies dysuria, endorses continence of urine MSK: endorsed weakness, endorsed unsteady gait Skin: denies rashes or wounds Neurological: endorsed generalized muscle pain, denies insomnia Psych: Endorses positive mood Heme/lymph/immuno: denies bruises, abnormal bleeding  Today's Vitals   07/19/20 1100  BP: (!) 152/70  Pulse: 68  Resp: 18  SpO2: 95%   There is no height or weight on file to calculate BMI.  Physical Exam: General: frail appearing, cooperative, sitting in chair in NAD EYES: anicteric sclera, no discharge  ENMT: hearing aids in, oral mucous membranes moist CV: RRR, no LE edema Pulmonary: LCTA, no increased work of breathing, no cough, room air Abdomen: no ascites GU: deferred MSK: mild sarcopenia, moves all extremities, ambulatory Skin: warm and dry, no rashes or wounds on visible skin Neuro: generalized weakness, mild cognitive impairment Psych: non-anxious affect, A and O x 3 Hem/lymph/immuno: no widespread bruising  CURRENT PROBLEM LIST:  Patient Active Problem List   Diagnosis Date Noted  . Rectal bleeding 02/08/2020  . Intractable diarrhea 02/08/2020  . Fatigue 06/16/2019  . Coronary artery disease involving native coronary artery of native heart without angina pectoris 10/16/2016  . Abnormal nuclear stress test   . Exertional dyspnea-? anginal equivalent 10/08/2015  . Elevated troponin 10/07/2015  . NSTEMI (non-ST elevated myocardial infarction) ([place]) 10/07/2015  . Essential hypertension   . Weight loss 02/01/2013  . Diabetes mellitus without complication ([place]) 92/02/69  . Hyperlipidemia 07/17/2006  . Coronary atherosclerosis 07/17/2006   PAST MEDICAL HISTORY:  Active Ambulatory Problems    Diagnosis Date Noted  . Diabetes mellitus without complication ([place]) 21/97/5883  . Hyperlipidemia 07/17/2006  . Coronary atherosclerosis 07/17/2006  . Weight loss  02/01/2013  . Elevated troponin 10/07/2015  . NSTEMI (non-ST elevated myocardial infarction) ([place]) 10/07/2015  . Essential hypertension   . Exertional dyspnea-? anginal  equivalent 10/08/2015  . Abnormal nuclear stress test   . Coronary artery disease involving native coronary artery of native heart without angina pectoris 10/16/2016  . Fatigue 06/16/2019  . Rectal bleeding 02/08/2020  . Intractable diarrhea 02/08/2020   Resolved Ambulatory Problems    Diagnosis Date Noted  . DERMATITIS 02/13/2009   Past Medical History:  Diagnosis Date  . Alzheimer disease ([place])   . Aortic atherosclerosis ([place])   . Atherosclerotic heart disease   . BPH (benign prostatic hyperplasia)   . Calculus of kidney   . Calculus of ureter   . Campylobacter diarrhea   . CORONARY ARTERY DISEASE Nov 2007  . Dementia ([place])   . DIABETES MELLITUS, TYPE II   . Dry skin dermatitis   . Glaucoma   . Hammer toe   . Hepatic steatosis   . Hip fracture, right ([place])   . History of kidney stones   . Hydronephrosis   . HYPERLIPIDEMIA   . HYPERTENSION   . IDA (iron deficiency anemia)   . Internal hemorrhoids   . Kidney stone 2015  . Peripheral vascular disease, unspecified ([place])   . Persistent mood disorder ([place])   . Presence of intraocular lens   . Sleep apnea   . Toe deformity   . Vitamin B 12 deficiency   . Vitamin D deficiency    SOCIAL HX:  Social History   Tobacco Use  . Smoking status: Former Smoker    Types: Cigarettes    Quit date: 02/24/1965    Years since quitting: 55.4  . Smokeless tobacco: Never Used  Substance Use Topics  . Alcohol use: No   FAMILY HX:  Family History  Problem Relation Age of Onset  . Heart attack Father   . Stroke Mother      ALLERGIES:  Allergies  Allergen Reactions  . Cozaar   . Ace Inhibitors Itching    Ache, itching..  . Crestor [Rosuvastatin Calcium]     Muscle aches  . Lipitor [Atorvastatin Calcium]     Muscle aches  . Lisinopril     REACTION: rash ?   . Lovastatin     Muscle aches  . Pravastatin     Muscle aches  . Statins     Other reaction(s): Malaise (intolerance)  . Zetia [Ezetimibe] Diarrhea  . Zocor [Simvastatin - High Dose]     Muscle aches  . Latex Rash     PERTINENT MEDICATIONS:  . Cholecalciferol 25 MCG (1000 UT) capsule Take 1 capsule (1,000 Units total) by mouth daily.  . cholestyramine (QUESTRAN) 4 g packet Take 1 packet by mouth 2 (two) times daily.  .    . hydrocortisone (ANUSOL-HC) 25 MG suppository Place 1 suppository (25 mg total) rectally at bedtime. (Patient not taking: Reported on 02/28/2020)  . loperamide (IMODIUM) 2 MG capsule Take 2 mg by mouth daily as needed for diarrhea or loose stools. (Patient not taking: Reported on 02/28/2020)  . memantine (NAMENDA) 10 MG tablet Take 10 mg by mouth 2 (two) times daily.  . Multiple Vitamins-Minerals (PRESERVISION AREDS 2 PO) Take 1 capsule by mouth in the morning and at bedtime.  . nitroGLYCERIN (NITROSTAT) 0.4 MG SL tablet DISSOLVE ONE TABLET UNDER THE TONGUE EVERY 5 MINUTES AS NEEDED FOR CHEST PAIN. (Patient not taking: Reported on 02/28/2020)  . REPATHA SURECLICK 517 MG/ML SOAJ INJECT 1 SYRINGE EVERY 14 DAYS (Patient taking differently: Inject 1 each into the skin See admin instructions. Every 14 days)  . sodium chloride (OCEAN)  0.65 % SOLN nasal spray Place 1 spray into both nostrils at bedtime.      . Travoprost, BAK Free, (TRAVATAN) 0.004 % SOLN ophthalmic solution Place 1 drop into both eyes at bedtime.    Thank you for the opportunity to participate in the care of Mr. Rabalais.  The palliative care team will continue to follow. Please call our office at [phone] if we can be of additional assistance.   Jari Favre, DNP, AGPCNP-BC  COVID-19 PATIENT SCREENING TOOL Asked and negative response unless otherwise noted:   Have you had symptoms of covid, tested positive or been in contact with someone with symptoms/positive test in the past 5-10 days?

## 2020-08-14 ENCOUNTER — Other Ambulatory Visit: Payer: Medicare Other | Admitting: Nurse Practitioner

## 2020-08-14 ENCOUNTER — Other Ambulatory Visit: Payer: Self-pay

## 2020-08-14 VITALS — BP 132/68 | HR 68 | Resp 18

## 2020-08-14 DIAGNOSIS — F0390 Unspecified dementia without behavioral disturbance: Secondary | ICD-10-CM

## 2020-08-14 DIAGNOSIS — Z515 Encounter for palliative care: Secondary | ICD-10-CM

## 2020-08-14 DIAGNOSIS — M79604 Pain in right leg: Secondary | ICD-10-CM

## 2020-08-14 NOTE — Progress Notes (Signed)
[facility] Palliative Care Consult Note Telephone: [phone]  Fax: [phone]    Date of encounter: 08/14/20 PATIENT NAME: Johnny [name] [address] [place] [address]   [phone] (home)  DOB: 1932-08-12 MRN: [id number]  PRIMARY CARE PROVIDER:    Reeves Dam, MD,  [place] [address] [phone]  REFERRING PROVIDER:   Reeves Dam, MD [address] [place],  [place] [address] [phone]  RESPONSIBLE PARTY:    Contact Information     Name Relation Home Work [place] Spouse [phone]  [phone]     I met face to face with patient and family in  home/facility. Palliative Care was asked to follow this patient by consultation request of  Reeves Dam, MD to address advance care planning and complex medical decision making. This is a follow up visit.                                  ASSESSMENT AND PLAN / RECOMMENDATIONS:   Advance Care Planning/Goals of Care: Goals include to maximize quality of life and symptom management. Our advance care planning conversation included a discussion about:    Exploration of personal, cultural or spiritual beliefs that might influence medical decisions  Exploration of goals of care in the event of a sudden injury or illness  CODE STATUS: DNR Goal of care: Patient's goal of care is comfort while preserving function. Directives: Signed DNR and MOST forms present in home, copy on [facility] EMR. Detail of MOST form include limited additional intervention, antibiotics if indicated, IV fluids for a defined trial period, no feeding tube.   Symptom Management/Plan: Right leg pain: Wife report patient currently on Acetaminophen 500mg  twice a day. Recommend patient start taking Acetaminophen 1000mg  three times a day for 5 days, then continue Acetaminophen 1000mg  twice a day.  Dementia: Patient is followed by May Williams psychiatrist  with [facility]. Continue supportive care. Continue Prozac 20mg  daily and Namenda 10mg  twice a day. Continue supportive care. Encourage activities that are both stimulating and enjoyable to patient.  Patient also receives in home primary care from [facility] [phone] led by Coy Saunas, NP. Provided general support and encouragement. Questions and concerns were addressed. Patient and family was encouraged to call with questions and/or concerns.  Follow up Palliative Care Visit: Palliative care will continue to follow for complex medical decision making, advance care planning, and clarification of goals. Return in about 5 weeks or prn.  PPS: 50% weak  HOSPICE ELIGIBILITY/DIAGNOSIS: TBD  CHIEF COMPLAIN: right lower leg pain  History obtained from review of Epic EMR and discussion with Mr. Lowdermilk and his wife.  HISTORY OF PRESENT ILLNESS:  Johnny [name]. is a 87 y.o. year old male with Dementia (FAST 6b), CAD with DES x 2 and CABG in 2007, PVD, Type diabetes (diet controlled), glaucoma and macular degeneration, hx of frequent diarrhea and rectal bleed. Patient complained of right lower leg pain, pain started 3 weeks ago, report pain at area of calf. Report pain occur intermittently, described pain a deep ache and sharp. Denied trauma to site, no swelling, no redness to site. He denied fever, denied chills, denied SOB, denied chest pain. No report of rectal bleed since last palliative care visit. Ten systems reviewed and are negative for acute change, except as noted in the HPI.   I  reviewed available labs, medications, imaging, studies and related documents from the EMR.  Records reviewed and summarized above.   Vitals:   08/14/20 1613  BP: 132/68  Pulse: 68  Resp: 18  SpO2: 95%    Physical Exam: EYES: denies acute vision changes ENMT: denies dysphagia Cardiovascular: denies chest pain, denies DOE Pulmonary: denies cough, denies increased SOB Abdomen: endorses fair  appetite, denies constipation, endorses continence of bowel GU: denies dysuria, endorses continence of urine MSK: endorsed weakness, endorsed unsteady gait Skin: denies rashes or wounds Neurological: denies insomnia Psych: Endorses positive mood Heme/lymph/immuno: denies bruises, abnormal bleeding  Past Medical History:  Diagnosis Date   Alzheimer disease ([place])    Aortic atherosclerosis (HCC)    Atherosclerotic heart disease    BPH (benign prostatic hyperplasia)    Calculus of kidney    Calculus of ureter    Campylobacter diarrhea    CORONARY ARTERY DISEASE Nov 2007   CABG x3    Dementia [facility])    DIABETES MELLITUS, TYPE II    Dry skin dermatitis    Glaucoma    Hammer toe    Hepatic steatosis    Hip fracture, right (HCC)    History of kidney stones    Hydronephrosis    HYPERLIPIDEMIA    Not able to take statins.   HYPERTENSION    IDA (iron deficiency anemia)    Internal hemorrhoids    Kidney stone 2015   Peripheral vascular disease, unspecified (HCC)    Persistent mood disorder (HCC)    Presence of intraocular lens    Sleep apnea    Toe deformity    Vitamin B 12 deficiency    Vitamin D deficiency     Thank you for the opportunity to participate in the care of Mr. Furtick.  The palliative care team will continue to follow. Please call our office at [phone] if we can be of additional assistance.   Jari Favre, DNP, AGPCNP-BC  COVID-19 PATIENT SCREENING TOOL Asked and negative response unless otherwise noted:   Have you had symptoms of covid, tested positive or been in contact with someone with symptoms/positive test in the past 5-10 days?

## 2020-09-17 ENCOUNTER — Telehealth: Payer: Self-pay | Admitting: Gastroenterology

## 2020-09-17 NOTE — Telephone Encounter (Signed)
Pt's wife called to inform that pt is still experiencing rectal bleeding. She would like some advise.

## 2020-09-17 NOTE — Telephone Encounter (Signed)
Line rings then goes busy.  Will try later today

## 2020-09-18 NOTE — Telephone Encounter (Signed)
Left message on machine to call back  

## 2020-09-21 ENCOUNTER — Telehealth: Payer: Self-pay | Admitting: Cardiovascular Disease

## 2020-09-21 NOTE — Telephone Encounter (Signed)
    Pt c/o medication issue:  1. Name of Medication:   REPATHA SURECLICK XX123456 MG/ML SOAJ    2. How are you currently taking this medication (dosage and times per day)? INJECT 1 SYRINGE EVERY 14 DAYSPatient taking differently: Inject 1 each into the skin See admin instructions. Every 14 days  3. Are you having a reaction (difficulty breathing--STAT)?   4. What is your medication issue? Pt's wife said when she picked up repatha the pharmacy charging them for refill, she said the copay card has ran out and requesting to get another one

## 2020-09-24 ENCOUNTER — Telehealth: Payer: Self-pay

## 2020-09-24 NOTE — Telephone Encounter (Signed)
(  5:27 pm) SW scheduled an RN/SW follow-up visit with patient for 09/26/20 @ 1 pm.

## 2020-09-24 NOTE — Telephone Encounter (Signed)
Called and spoke w/pt' wife and stated that the [employer] has ran out of funds and that they can call every week to see if there has been a status change to Hotline: [phone] and they voiced understanding.

## 2020-09-25 NOTE — Telephone Encounter (Signed)
Spoke with patient's wife, she states that patient is still having some diarrhea despite the Imodium. Advised that she can give patient 2 Imodium in the morning for a few days and see if that helps. She states that patient's rectal bleeding has improved some. Denies any fever. She states that she has been making sure he has been eating and staying hydrated with water. Advised that he needs electrolytes also to replace what he has lost. Patient is scheduled for a follow up with Dr. Loletha Carrow on Tuesday, 10/02/20 at 3:40 PM. Patient's wife verbalized understanding and had no concerns at the end of the call

## 2020-09-26 ENCOUNTER — Other Ambulatory Visit: Payer: Medicare Other | Admitting: *Deleted

## 2020-09-26 ENCOUNTER — Other Ambulatory Visit: Payer: Medicare Other

## 2020-09-26 ENCOUNTER — Other Ambulatory Visit: Payer: Self-pay

## 2020-09-26 VITALS — BP 136/83 | HR 62 | Temp 97.6°F | Resp 16

## 2020-09-26 DIAGNOSIS — Z515 Encounter for palliative care: Secondary | ICD-10-CM

## 2020-09-26 NOTE — Progress Notes (Addendum)
[facility] RN NOTE  PATIENT NAME: Johnny [name]. DOB: [birth year] MRN: [id number]  PRIMARY CARE PROVIDER: Reeves Dam, MD  RESPONSIBLE PARTY: Larena Sox (wife) Acct ID - Guarantor Home Phone Work Phone Relationship Acct Type  [id number] DIONE, [phone]  Self P/F     [address] [place], [place] [address]   Covid-19 Pre-screening Negative  PLAN OF CARE and INTERVENTION:  ADVANCE CARE PLANNING/GOALS OF CARE: Goal is for patient to move to a [facility] facility in [place] once all paperwork is completed.  PATIENT/CAREGIVER EDUCATION: Dementia education, Symptom management, safe mobility/transfers, fall prevention DISEASE STATUS: Joint follow-up palliative care visit completed with [facility] [place], LCSW. Met with patient and his wife in there Independent Living apartment at the [facility]. Wife has noticed that patient continues to slowly decline. He is intermittently confused with poor short term memory. He asks the same questions and repeats statements several times throughout the visit. Also defers often to wife to answer questions. He is spending more time lying in bed, but not necessarily sleeping. He says he has not been feelling well for the past few days, but couldn't provide details. He denies pain. He does experience some shortness of breath with exertion. He is ambulatory using a cane inside his home and rollator walker outside of his apartment. He has had 2 recent falls without injury. He is progressively weaker. He is able to perform ADLs independently at times and other days requires assistance from his wife. His intake is decreasing. He is only eating 2 meals per day. This morning he drank 2 Glucerna's and an applesauce cup. She is having to feed patient his meals, otherwise he will not eat. He has had some diarrhea with some bright red blood noted in his stool. She gave 2 Imodium tablets as directed by his GI specialist Dr. Loletha Carrow. It is  believed that the bleeding may be from a large hemorrhoid. He is intermittently incontinent of both bowel and bladder and wears adult pull-ups. Patient has an appointment tomorrow with the [facility] for new hearing aids and an appointment with GI specialist next week. Wife speaks of her caregiver role strain and not being to leave home and run errands due to patient safety. Wife currently completing paperwork for patient to move to a [facility] facility in [place] and for her to move into a close-by apartment. She will also be closer to her daughter for more assistance. She is appreciative of visit today and will contact palliative care with any additional questions/concerns.  CODE STATUS: DNR ADVANCED DIRECTIVES: Y MOST FORM: yes PPS: 50%   PHYSICAL EXAM:   VITALS: Today's Vitals   09/26/20 1327  BP: 136/83  Pulse: 62  Resp: 16  Temp: 97.6 F (36.4 C)  TempSrc: Temporal  SpO2: 95%  PainSc: 0-No pain    LUNGS: clear to auscultation  CARDIAC: Cor RRR EXTREMITIES: No edema SKIN:  Pale skin, no skin breakdown noted or reported   NEURO:  Alert and oriented to person/place, intermittent confusion, poor short term memory, increased generalized weakness, ambulatory w/cane or rollator walker   (Duration of visit and documentation 75 minutes)   Daryl Eastern, RN BSN

## 2020-09-26 NOTE — Progress Notes (Signed)
COMMUNITY PALLIATIVE CARE SW NOTE  PATIENT NAME: Johnny [name]. DOB: 26-Dec-1932 MRN: [id number]  PRIMARY CARE PROVIDER: Reeves Dam, MD  RESPONSIBLE PARTY:  Acct ID - Guarantor Home Phone Work Phone Relationship Acct Type  [id number] IREN, [id number]  Self P/F     [address] [place], [place] [address]     PLAN OF CARE and INTERVENTIONS:             GOALS OF CARE/ ADVANCE CARE PLANNING:  Goal is for patient to be placed in [place] facility. Patient is a DNR. SOCIAL/EMOTIONAL/SPIRITUAL ASSESSMENT/ INTERVENTIONS:  SW and RN- M. Nadara Mustard completed a [place]. His wife was present with him. Patient was in bed. He appeared to be pale in appearance. Patient was wearing gloves and hoodie on his head at one point. He appeared to be alert and oriented to self and situation, however, he appeared to have some cognitive deficits. Patient asked same questions repeatedly and sought his wife's assistance in answering questions as his short-term memory was poor. Patient denied pain, but report not feeling well. Patient has had 2 recent falls without injury. He has been found wandering outside of his apartment. He is independent for personal care tasks, however his wife is feeding him his meals. His wife report an overall decline in patient's intake. He has diarrhea and blood in his stool.  Patient is incontinent of bowel and bladder. Patient is seen regularly at the [facility] as he is an [employer]/WW[employer] veteran. His wife express caregiver fatigue as patient care needs are increasing as his cognitive declines. She is currently working with the [facility] to get him placed in a [facility] facility in [place]. His wife will move to [place] also, and will have the support of her daughter who also lives in the area. There is no tentative date for this move to take place, however the paperwork is in process. SW provided caregiver resources regarding day treatment programs,  encouragement, validated and normalized feelings and strongly encouraged self care. Patient and his wife remain open to palliative care support.  PATIENT/CAREGIVER EDUCATION/ COPING:  Patient is coping well within his cognitive decline. PCG is fatigued and stressed but has church support, a network of peers at the facility and her children.  PERSONAL EMERGENCY PLAN: Per facility protocol.  COMMUNITY RESOURCES COORDINATION/ HEALTH CARE NAVIGATION: Patient goes to [facility] FINANCIAL/LEGAL CONCERNS/INTERVENTIONS:  None.     SOCIAL HX:  Social History   Tobacco Use   Smoking status: Former    Types: Cigarettes    Quit date: 02/24/1965    Years since quitting: 55.6   Smokeless tobacco: Never  Substance Use Topics   Alcohol use: No    CODE STATUS: DNR  ADVANCED DIRECTIVES: Yes MOST FORM COMPLETE:  Yes HOSPICE EDUCATION PROVIDED: No  PPS: Patient is alert and oriented to self and situation. He is pleasantly confused, incontinent of bowel and bladder and ambulates with a Rolator or cane.  Duration of visit and documentation: 60 minutes  Katheren Puller, LCSW

## 2020-10-02 ENCOUNTER — Encounter: Payer: Self-pay | Admitting: Gastroenterology

## 2020-10-02 ENCOUNTER — Ambulatory Visit: Payer: Medicare Other | Admitting: Gastroenterology

## 2020-10-02 VITALS — BP 102/60 | HR 75 | Ht 68.0 in | Wt 142.0 lb

## 2020-10-02 DIAGNOSIS — K529 Noninfective gastroenteritis and colitis, unspecified: Secondary | ICD-10-CM

## 2020-10-02 DIAGNOSIS — K648 Other hemorrhoids: Secondary | ICD-10-CM | POA: Diagnosis not present

## 2020-10-02 NOTE — Patient Instructions (Signed)
If you are age 85 or older, your body mass index should be between 23-30. Your Body mass index is 21.59 kg/m. If this is out of the aforementioned range listed, please consider follow up with your Primary Care Provider.  If you are age 79 or younger, your body mass index should be between 19-25. Your Body mass index is 21.59 kg/m. If this is out of the aformentioned range listed, please consider follow up with your Primary Care Provider.   __________________________________________________________  The [facility] providers would like to encourage you to use [facility] to communicate with providers for non-urgent requests or questions.  Due to long hold times on the telephone, sending your provider a message by [facility] may be a faster and more efficient way to get a response.  Please allow 48 business hours for a response.  Please remember that this is for non-urgent requests.   It was a pleasure to see you today!  Thank you for trusting me with your gastrointestinal care!

## 2020-10-02 NOTE — Progress Notes (Signed)
GI Progress Note  Chief Complaint: Chronic diarrhea and rectal bleeding  Subjective  History: Johnny [name] was here today with his wife Johnny [name].  She has found it increasingly difficult to take care of him with his progressive dementia.  He still has intermittent loose stool treated with Imodium.  She felt that once daily Questran powder helped, but had to stop it because the [facility] [place] did not approve it for some reason.  Johnny [name] tells me that they now have a pharmacist working for a home care agency, and they are helping to manage Johnny [name]'s medications. Johnny [name] is alert and conversational but his dementia precludes helpful history or review of systems. Johnny [name] says he has switched to lactose-free ice cream, still likes to drink Coca-Cola more than she thinks he should but she is try not to take any more things away from him. She also saw some smearing of blood in his diaper recently.  The patient's Past Medical, Family and Social History were reviewed and are on file in the EMR.  Objective:  Med list reviewed  Current Outpatient Medications:    Cholecalciferol 25 MCG (1000 UT) capsule, Take 1 capsule (1,000 Units total) by mouth daily., Disp: 30 capsule, Rfl: 1   FLUoxetine (PROZAC) 20 MG capsule, TAKE ONE CAPSULE BY MOUTH DAILY FOR MENTAL HEALTH AND FOR MOOD, Disp: , Rfl:    hydrocortisone (ANUSOL-HC) 25 MG suppository, Place 1 suppository (25 mg total) rectally at bedtime., Disp: 12 suppository, Rfl: 0   loperamide (IMODIUM) 2 MG capsule, Take 2 mg by mouth daily as needed for diarrhea or loose stools., Disp: , Rfl:    memantine (NAMENDA) 10 MG tablet, Take 10 mg by mouth 2 (two) times daily., Disp: , Rfl:    Multiple Vitamins-Minerals (PRESERVISION AREDS 2 PO), Take 1 capsule by mouth in the morning and at bedtime., Disp: , Rfl:    nitroGLYCERIN (NITROSTAT) 0.4 MG SL tablet, DISSOLVE ONE TABLET UNDER THE TONGUE EVERY 5 MINUTES AS NEEDED FOR CHEST PAIN., Disp: 25 tablet, Rfl: 5    REPATHA SURECLICK XX123456 MG/ML SOAJ, INJECT 1 SYRINGE EVERY 14 DAYS (Patient taking differently: Inject 1 each into the skin See admin instructions. Every 14 days), Disp: 2 mL, Rfl: 11   sodium chloride (OCEAN) 0.65 % SOLN nasal spray, Place 1 spray into both nostrils at bedtime., Disp: , Rfl:    Travoprost, BAK Free, (TRAVATAN) 0.004 % SOLN ophthalmic solution, Place 1 drop into both eyes at bedtime., Disp: , Rfl:    vitamin C (ASCORBIC ACID) 250 MG tablet, Take 250 mg by mouth daily., Disp: , Rfl:    Vital signs in last 24 hrs: Vitals:   10/02/20 1541  BP: 102/60  Pulse: 75  SpO2: 99%   Wt Readings from Last 3 Encounters:  10/02/20 142 lb (64.4 kg)  02/28/20 153 lb (69.4 kg)  02/15/20 153 lb (69.4 kg)    Physical Exam Wheelchair-bound Abdomen soft nondistended, no apparent tenderness Labs:   ___________________________________________ Radiologic studies:   ____________________________________________ Other:   _____________________________________________ Assessment & Plan  Assessment: Encounter Diagnoses  Name Primary?   Chronic diarrhea Yes   Bleeding internal hemorrhoids    Chronic diarrhea of unclear cause, possibly dietary contributions, no microscopic colitis found.  No weight loss to suggest malabsorption. Low-dose Imodium has helped, but caution warranted given his age and relative immobility.  His overall condition has become more frail, he is falling a lot and his wife finds it difficult to care for him.  Therefore,  conservative management warranted.  Johnny [name] feels that the once daily Questran was helpful when they were able to get it.  I asked her to speak with this pharmacist and see if they can determine the approximate cost to pay for Questran at a local pharmacy rather than through the [facility].  I suspect it will be relatively inexpensive.  With that information, we can then send a prescription and help keep this condition under control.  I will be glad to see  him as needed.  It seems to be a hardship for his wife to bring him to the office.   21 minutes were spent on this encounter (including chart review, history/exam, counseling/coordination of care, and documentation) > 50% of that time was spent on counseling and coordination of care.  Topics discussed included: See above.  Nelida Meuse III

## 2020-12-24 ENCOUNTER — Ambulatory Visit: Payer: No Typology Code available for payment source | Admitting: Cardiovascular Disease

## 2021-03-04 ENCOUNTER — Telehealth: Payer: Self-pay

## 2021-03-04 NOTE — Addendum Note (Signed)
Addended by: Allean Found on: 03/04/2021 02:48 PM   Modules accepted: Orders

## 2021-03-04 NOTE — Telephone Encounter (Signed)
Called and spoke w/pt's spouse who stated that they stopped repatha and they are in a nursing home

## 2021-04-02 IMAGING — CT CT ABD-PELV W/ CM
2 of 5 series · 15 of 46 positions shown, 17 images · IV contrast (OMNIPAQUE 300)
Comparison: 07/01/2013

CLINICAL DATA: Unintended weight loss, diarrhea, hematochezia

EXAM:
CT ABDOMEN AND PELVIS WITH CONTRAST
TECHNIQUE: Multidetector CT imaging of the abdomen and pelvis was performed
using the standard protocol following bolus administration of
intravenous contrast.
CONTRAST:  100mL OMNIPAQUE IOHEXOL 300 MG/ML  SOLN

[Series 2: axial st · axial · 0.75mm/px · z∈[+1035,+1410]mm · 12 of 87 slices shown, 14 images]
[im 6/87  soft-tissue]
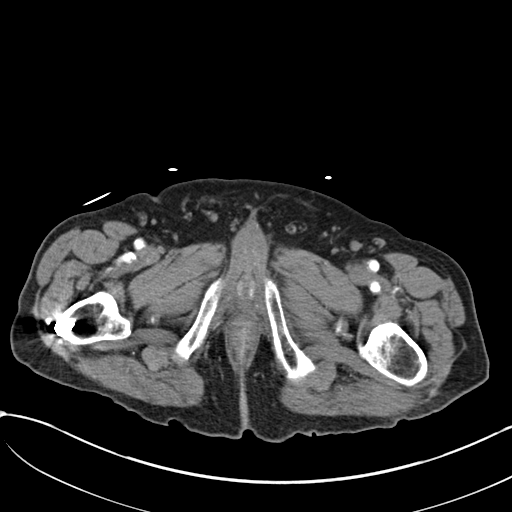
[im 6/87  bone]
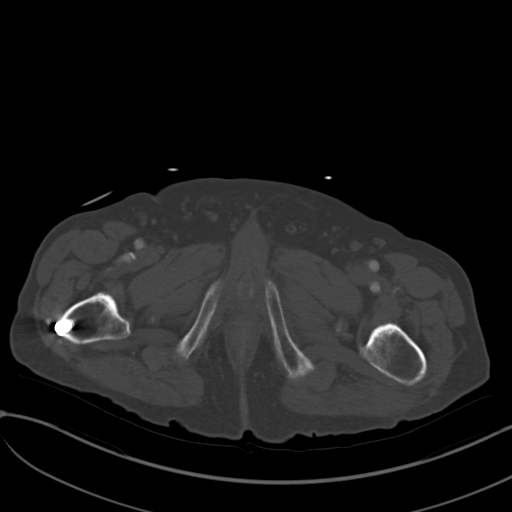
[im 11/87  soft-tissue]
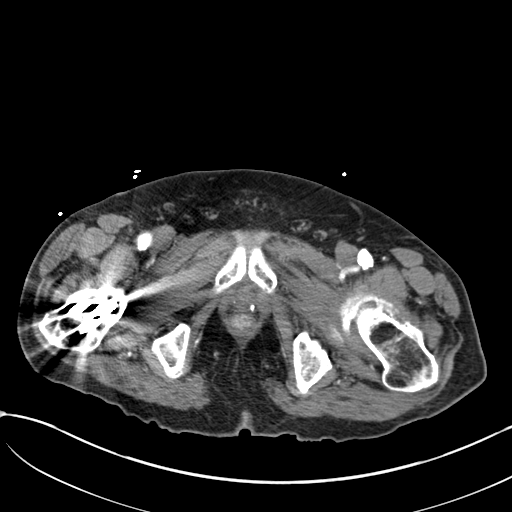
[im 22/87  soft-tissue]
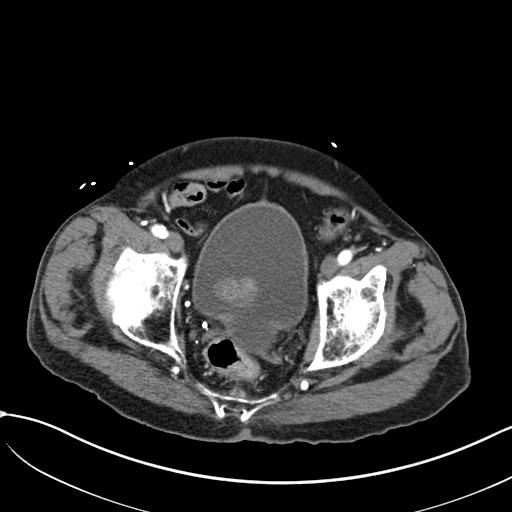
[im 27/87  soft-tissue]
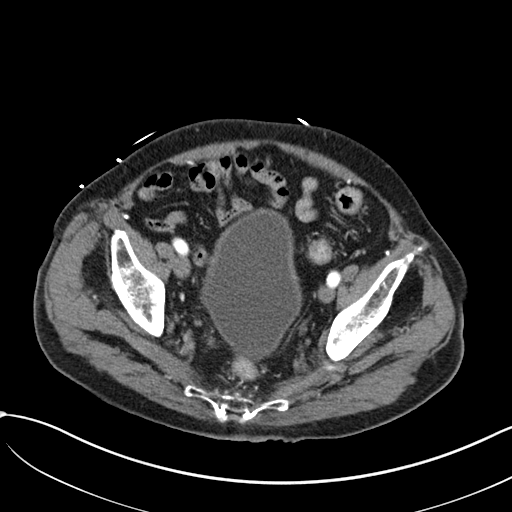
[im 33/87  soft-tissue]
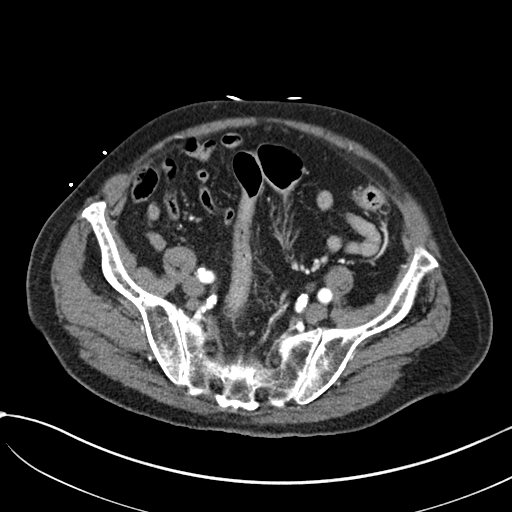
[im 38/87  soft-tissue]
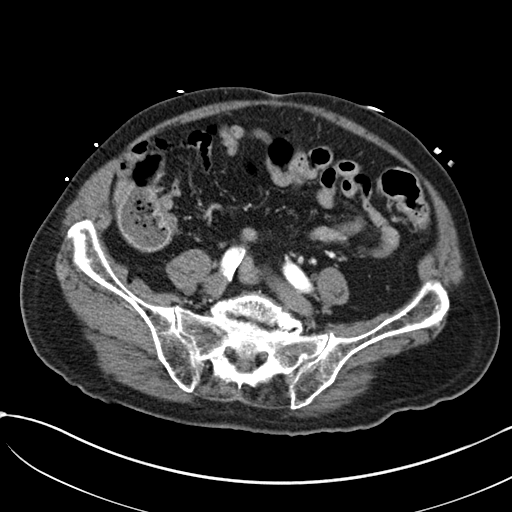
[im 49/87  soft-tissue]
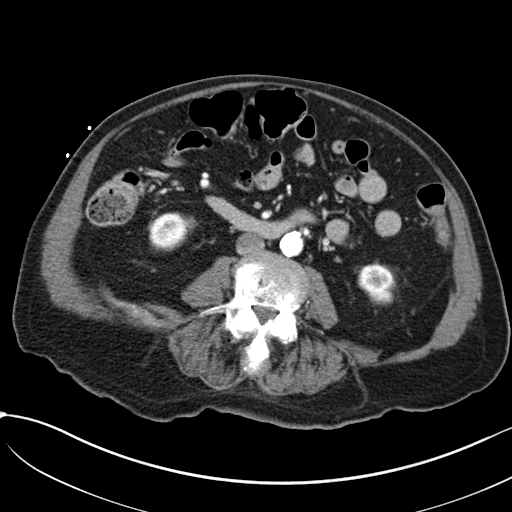
[im 54/87  soft-tissue]
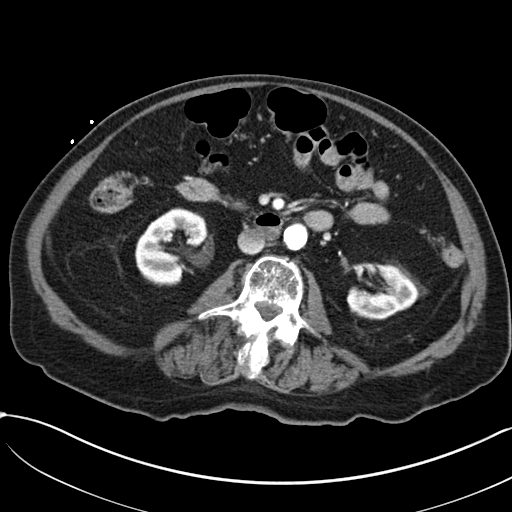
[im 60/87  soft-tissue]
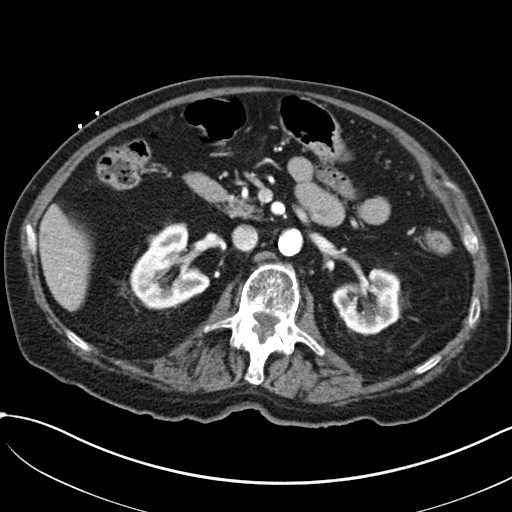
[im 60/87  bone]
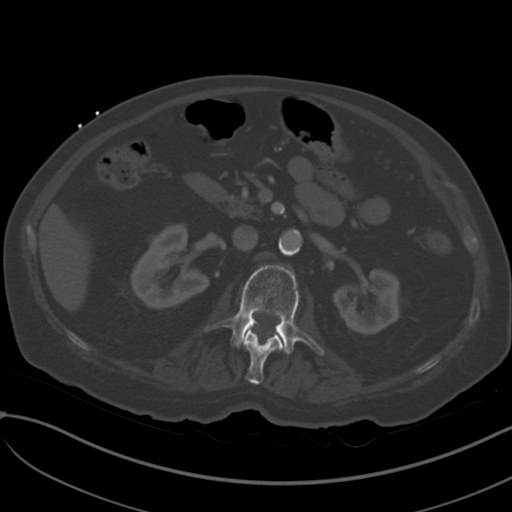
[im 65/87  soft-tissue]
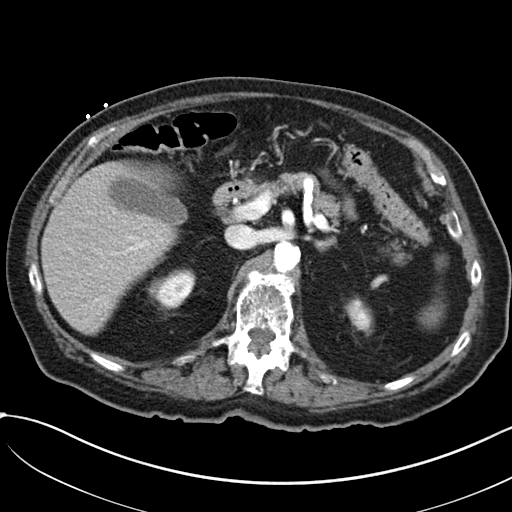
[im 76/87  soft-tissue]
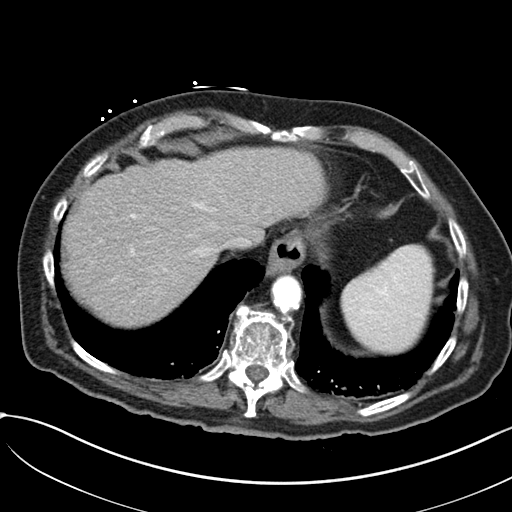
[im 81/87  soft-tissue]
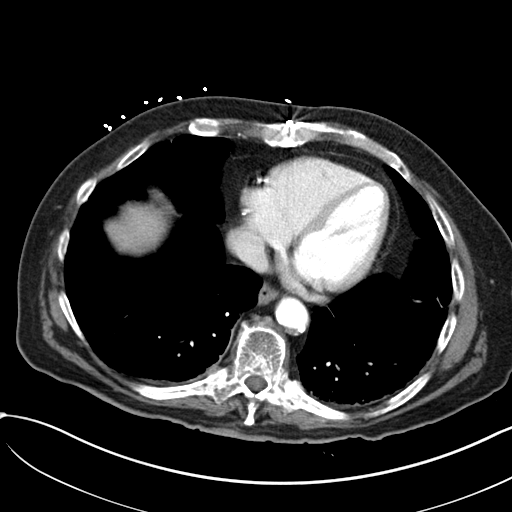

[Series 4: coronal st · coronal · 0.74mm/px · 3 of 137 slices shown]
[im 46/137  soft-tissue]
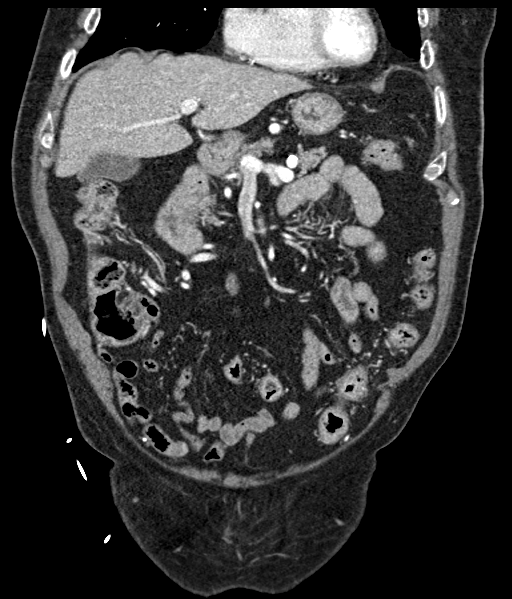
[im 61/137  soft-tissue]
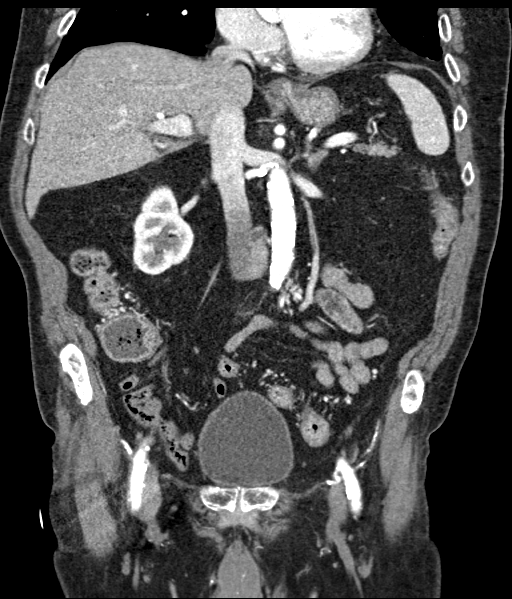
[im 76/137  soft-tissue]
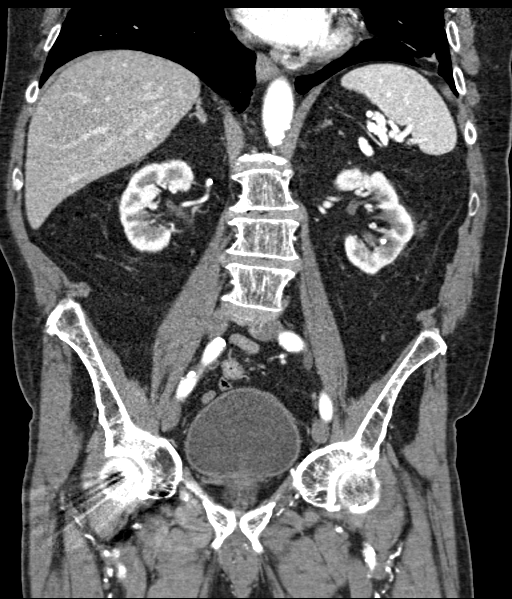

[15 of 46 positions shown; findings below may reference images not displayed]

FINDINGS: Lower chest: Mild bibasilar bronchiectasis and minimal fibrosis,
left greater than right best appreciated at the costophrenic angles.
No superimposed focal pulmonary infiltrate. Coronary artery bypass
grafting has been performed. Global cardiac size within normal
limits. Small hiatal hernia.

Hepatobiliary: Mild hepatic steatosis. No enhancing liver lesion. No
intra or extrahepatic biliary ductal dilation. Gallbladder
unremarkable.

Pancreas: Unremarkable

Spleen: Unremarkable

Adrenals/Urinary Tract: The adrenal glands are unremarkable. The
kidneys are normal in position. Mild bilateral renal cortical
atrophy left slightly greater than right. 4 mm in nonobstructing
calculus noted within the lower pole of the left kidney. No ureteral
calculi. No hydronephrosis. No enhancing renal masses. Marked
central prostatic hypertrophy indents and protrudes into the base of
the bladder. The bladder is not distended, however. Tiny bladder
diverticulum is identified suggesting changes of at least mild
bladder outlet obstruction.

Stomach/Bowel: Stomach is within normal limits. Appendix appears
normal. No evidence of bowel wall thickening, distention, or
inflammatory changes. No free intraperitoneal gas or fluid.

Vascular/Lymphatic: Extensive aortoiliac atherosclerotic
calcification. No aortic aneurysm. Particularly prominent
atherosclerotic calcification is seen at the renal ostia bilaterally
as well as the mesenteric arterial vasculature, however, the degree
of stenosis is not well assessed on this non arteriographic study.
No pathologic adenopathy within the abdomen and pelvis.

Reproductive: As noted above, the prostate gland is markedly
hypertrophied centrally, protruding into the bladder lumen. Seminal
vesicles are unremarkable.

Other: Rectum unremarkable.  No abdominal wall hernia identified.

Musculoskeletal: Right hip pinning has been performed. Degenerative
changes are seen within the lumbar spine. No acute bone abnormality.
IMPRESSION: No definite radiographic explanation for the patient's reported
weight loss. Peripheral vascular disease, however, is noted and,
while similar to prior examination, the degree of stenosis involving
the mesenteric and renal arterial vasculature is not well assessed
on this exam. If there is clinical evidence of chronic mesenteric
ischemia, CT arteriography may be more helpful to better assess the
degree of stenosis.

Mild hepatic steatosis.

Mild nonobstructing left nephrolithiasis.

Marked central prostatic hypertrophy, which protrudes into the
bladder lumen, with at least mild bladder outlet obstruction. The
bladder is not distended at this time.

Aortic Atherosclerosis ([id number]).
